# Patient Record
Sex: Female | Born: 1990 | Race: White | Hispanic: No | Marital: Single | Smoking: Current every day smoker
Health system: Southern US, Community
[De-identification: ages and names within clinical notes are randomized; demographics above are authoritative.]

## PROBLEM LIST (undated history)

## (undated) ENCOUNTER — Ambulatory Visit: Source: Home / Self Care

## (undated) DIAGNOSIS — K219 Gastro-esophageal reflux disease without esophagitis: Secondary | ICD-10-CM

## (undated) DIAGNOSIS — R011 Cardiac murmur, unspecified: Secondary | ICD-10-CM

## (undated) DIAGNOSIS — I471 Supraventricular tachycardia, unspecified: Secondary | ICD-10-CM

## (undated) DIAGNOSIS — F329 Major depressive disorder, single episode, unspecified: Secondary | ICD-10-CM

## (undated) DIAGNOSIS — K529 Noninfective gastroenteritis and colitis, unspecified: Secondary | ICD-10-CM

## (undated) DIAGNOSIS — E785 Hyperlipidemia, unspecified: Secondary | ICD-10-CM

## (undated) DIAGNOSIS — F419 Anxiety disorder, unspecified: Secondary | ICD-10-CM

## (undated) DIAGNOSIS — R002 Palpitations: Principal | ICD-10-CM

## (undated) DIAGNOSIS — I499 Cardiac arrhythmia, unspecified: Secondary | ICD-10-CM

## (undated) DIAGNOSIS — R519 Headache, unspecified: Secondary | ICD-10-CM

## (undated) DIAGNOSIS — N83209 Unspecified ovarian cyst, unspecified side: Secondary | ICD-10-CM

## (undated) DIAGNOSIS — F32A Depression, unspecified: Secondary | ICD-10-CM

## (undated) DIAGNOSIS — C73 Malignant neoplasm of thyroid gland: Secondary | ICD-10-CM

## (undated) DIAGNOSIS — R55 Syncope and collapse: Secondary | ICD-10-CM

## (undated) HISTORY — DX: Gastro-esophageal reflux disease without esophagitis: K21.9

## (undated) HISTORY — DX: Palpitations: R00.2

## (undated) HISTORY — DX: Hyperlipidemia, unspecified: E78.5

## (undated) HISTORY — DX: Syncope and collapse: R55

## (undated) HISTORY — DX: Noninfective gastroenteritis and colitis, unspecified: K52.9

## (undated) HISTORY — DX: Supraventricular tachycardia, unspecified: I47.10

## (undated) HISTORY — PX: WISDOM TOOTH EXTRACTION: SHX21

## (undated) HISTORY — DX: Supraventricular tachycardia: I47.1

## (undated) HISTORY — DX: Unspecified ovarian cyst, unspecified side: N83.209

## (undated) HISTORY — DX: Malignant neoplasm of thyroid gland: C73

---
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
## 2012-11-04 ENCOUNTER — Other Ambulatory Visit: Payer: Self-pay | Admitting: *Deleted

## 2012-11-04 ENCOUNTER — Ambulatory Visit (INDEPENDENT_AMBULATORY_CARE_PROVIDER_SITE_OTHER): Payer: Self-pay | Admitting: Family Medicine

## 2012-11-04 ENCOUNTER — Encounter: Payer: Self-pay | Admitting: Family Medicine

## 2012-11-04 VITALS — BP 125/73 | HR 67 | Ht 64.0 in | Wt 118.2 lb

## 2012-11-04 DIAGNOSIS — J020 Streptococcal pharyngitis: Secondary | ICD-10-CM | POA: Insufficient documentation

## 2012-11-04 DIAGNOSIS — J029 Acute pharyngitis, unspecified: Secondary | ICD-10-CM

## 2012-11-04 MED ORDER — AMOXICILLIN 200 MG/5ML PO SUSR: 500.0000 mg | Freq: Two times a day (BID) | ORAL | Status: DC

## 2012-11-04 NOTE — Telephone Encounter (Signed)
Called pharmacy and spoke with May. Re: Amoxicillin for 10 days. Lorenda Hatchet, Renato Battles

## 2012-11-04 NOTE — Patient Instructions (Addendum)
It was nice to meet you today.  I think you are unfortunate and have both a cold AND strep throat.  I sent in the liquid antibiotic for you to take- 2 times per day for 10 days.  I would not go to work until Wednesday at the earliest, so that you are not infecting other people.  Come back if you don't start feeling better by the end of the week.   Strep Throat Strep throat is an infection of the throat caused by a bacteria named Streptococcus pyogenes. Your caregiver may call the infection streptococcal "tonsillitis" or "pharyngitis" depending on whether there are signs of inflammation in the tonsils or back of the throat. Strep throat is most common in children from 40 to 67 years old during the cold months of the year, but it can occur in people of any age during any season. This infection is spread from person to person (contagious) through coughing, sneezing, or other close contact. SYMPTOMS   Fever or chills.  Painful, swollen, red tonsils or throat.  Pain or difficulty when swallowing.  White or yellow spots on the tonsils or throat.  Swollen, tender lymph nodes or "glands" of the neck or under the jaw.  Red rash all over the body (rare). DIAGNOSIS  Many different infections can cause the same symptoms. A test must be done to confirm the diagnosis so the right treatment can be given. A "rapid strep test" can help your caregiver make the diagnosis in a few minutes. If this test is not available, a light swab of the infected area can be used for a throat culture test. If a throat culture test is done, results are usually available in a day or two. TREATMENT  Strep throat is treated with antibiotic medicine. HOME CARE INSTRUCTIONS   Gargle with 1 tsp of salt in 1 cup of warm water, 3 to 4 times per day or as needed for comfort.  Family members who also have a sore throat or fever should be tested for strep throat and treated with antibiotics if they have the strep  infection.  Make sure everyone in your household washes their hands well.  Do not share food, drinking cups, or personal items that could cause the infection to spread to others.  You may need to eat a soft food diet until your sore throat gets better.  Drink enough water and fluids to keep your urine clear or pale yellow. This will help prevent dehydration.  Get plenty of rest.  Stay home from school, daycare, or work until you have been on antibiotics for 24 hours.  Only take over-the-counter or prescription medicines for pain, discomfort, or fever as directed by your caregiver.  If antibiotics are prescribed, take them as directed. Finish them even if you start to feel better. SEEK MEDICAL CARE IF:   The glands in your neck continue to enlarge.  You develop a rash, cough, or earache.  You cough up green, yellow-brown, or bloody sputum.  You have pain or discomfort not controlled by medicines.  Your problems seem to be getting worse rather than better. SEEK IMMEDIATE MEDICAL CARE IF:   You develop any new symptoms such as vomiting, severe headache, stiff or painful neck, chest pain, shortness of breath, or trouble swallowing.  You develop severe throat pain, drooling, or changes in your voice.  You develop swelling of the neck, or the skin on the neck becomes red and tender.  You have a fever.  You  develop signs of dehydration, such as fatigue, dry mouth, and decreased urination.  You become increasingly sleepy, or you cannot wake up completely. Document Released: 12/01/2000 Document Revised: 02/26/2012 Document Reviewed: 02/02/2011 [facility] Patient Information 2013 [place], [place].

## 2012-11-04 NOTE — Progress Notes (Signed)
S: Pt comes in today for SDA for HA and sore throat.  Patient reports a history of strep throat, most recently >6 months.  Sore throat x2 days, HA starting this AM.  Throat pain is severe.  Thinks she had a fever.  Took a Goody's today to help with her headache. + mild nausea but no vomiting but thinks this is from the phlegm.  No diarrhea.  +runny nose and cough for 1-2 weeks, then this all started.  No one else at home is sick.  Works at [employer].     ROS: Per HPI  History  Smoking status  . Current Every Day Smoker -- 1.0 packs/day  . Types: Cigarettes  Smokeless tobacco  . Not on file    Comment: thinking about quitting.    O:  Filed Vitals:   11/04/12 1503  BP: 125/73  Pulse: 67    Gen: NAD HEENT: MMM, significant pharyngeal erythema with 1 small exudate on R peritonsillar area, tonsils are not enlarged, shotty cervical LAD CV: RRR, no murmur Pulm: CTA bilat, no wheezes or crackles   A/P: 21 y.o. female p/w strep throat -See problem list -f/u in PRN

## 2012-11-04 NOTE — Assessment & Plan Note (Addendum)
Rapid test positive. Pt does not take pills well, will Rx amox suspension 500mg  BID x10 days for treatment. Out of work until Thursday AM

## 2012-12-25 ENCOUNTER — Ambulatory Visit (INDEPENDENT_AMBULATORY_CARE_PROVIDER_SITE_OTHER): Payer: Self-pay | Admitting: Emergency Medicine

## 2012-12-25 ENCOUNTER — Encounter: Payer: Self-pay | Admitting: Emergency Medicine

## 2012-12-25 VITALS — BP 118/61 | HR 98 | Temp 99.6°F | Wt 118.0 lb

## 2012-12-25 DIAGNOSIS — J069 Acute upper respiratory infection, unspecified: Secondary | ICD-10-CM

## 2012-12-25 MED ORDER — HYDROCODONE-HOMATROPINE 5-1.5 MG/5ML PO SYRP: 5.0000 mL | ORAL_SOLUTION | Freq: Three times a day (TID) | ORAL | Status: DC | PRN

## 2012-12-25 NOTE — Progress Notes (Signed)
  Subjective:    Patient ID: Jenna [name], female    DOB: [birth year], 22 y.o.   MRN: [id number]  HPI Jenna [name] is here for a SDA for flu-like symptoms.  She reports 3 days of cough, rhinorrhea, nasal congestion, and body aches.  States had a fever of 100.?.  Also has had some wheezing.  Initially had a sore throat, but this is improved now.  No n/v/d/abd pain.  Ears feel stuffy.  Has been taking tylenol and ibuprofen as needed for fever and discomfort.  Did not receive flu shot this year.  I have reviewed and updated the following as appropriate: allergies and current medications PMHx: no asthma  SHx: current smoker  Review of Systems See HPI    Objective:   Physical Exam BP 118/61  Pulse 98  Temp 99.6 F (37.6 C) (Oral)  Wt 118 lb (53.524 kg) Gen: alert, cooperative, NAD HEENT: AT/[place], sclera white, MMM, no pharyngeal erythema or exudate Neck: supple, no LAD CV: regular rhythm, mild tachycardia, no murmurs Pulm: normal work of breathing, good air movement, end inspiratory wheezes in bilateral bases, no egophony Ext: no edema, 2+ DP pulses bilaterally     Assessment & Plan:

## 2012-12-25 NOTE — Patient Instructions (Addendum)
I think you might have the flu. Use Afrin (oxymetolazone) nasal spray for nasal congestion.  Use every 12 hours for NO MORE THAN 3 DAYS!! Use ibuprofen as needed for fever or discomfort. Use the cough syrup as needed. You should start feeling better by this weekend. If you are not feeling better, please come back to clinic on Monday and we can re-evaluate and get a chest x-ray.  If this get worse over the next day or two, please come back sooner.

## 2012-12-25 NOTE — Assessment & Plan Note (Addendum)
Possible influenza.  Outside tami-flu window.  Discussed symptomatic treatment with Afrin and Hycodan.  Will return on Monday if not feeling better.  Will likely need CXR at that time, if not better.

## 2013-06-09 ENCOUNTER — Ambulatory Visit (INDEPENDENT_AMBULATORY_CARE_PROVIDER_SITE_OTHER): Payer: Self-pay | Admitting: Family Medicine

## 2013-06-09 ENCOUNTER — Encounter: Payer: Self-pay | Admitting: Family Medicine

## 2013-06-09 ENCOUNTER — Ambulatory Visit: Payer: Self-pay

## 2013-06-09 VITALS — BP 132/84 | HR 89 | Temp 97.8°F | Wt 117.0 lb

## 2013-06-09 DIAGNOSIS — J020 Streptococcal pharyngitis: Secondary | ICD-10-CM

## 2013-06-09 LAB — POCT RAPID STREP A (OFFICE): Rapid Strep A Screen: NEGATIVE

## 2013-06-09 MED ORDER — PENICILLIN V POTASSIUM 250 MG/5ML PO SOLR: 500.0000 mg | Freq: Two times a day (BID) | ORAL | Status: DC

## 2013-06-09 NOTE — Assessment & Plan Note (Signed)
Rapid strep negative. Given temperature likely >100.4 without meds, no cough, tender lymph nodes, and tonsilar enlargement-Centor score of 4. Will empirically treat for strep throat at this time and use liquid due to patient history of poor compliance with pills.

## 2013-06-09 NOTE — Patient Instructions (Signed)
I am going to treat you for strep throat given your elevated temperatures while on medicine, tender lymph nodes, swollen tonsils and lack of cough. Take antibiotic for 10 days.   Follow up in 10 days if not improved or earlier if symptoms worsen.   Thanks,  Dr. Durene Cal

## 2013-06-09 NOTE — Progress Notes (Signed)
Subjective:   # Sore Throat Patient complains of sore throat. Associated symptoms include sore throat, swollen glands and denies cough. Onset of symptoms was 4 days ago, and have been gradually worsening since that time. She is drinking moderate amounts of fluids. She has not had recent close exposure to someone with proven streptococcal pharyngitis but works at [employer] and has had many sick contacts. Temperature up to 100.2 and constantly taking ibuprofen/tylenol for pain. No cough/cold/congestion symptoms.   ROS--See HPI  Past Medical History-history of multiple occurences of strep throat Reviewed problem list.  Medications- reviewed and updated Chief complaint-noted  Objective: BP 132/84  Pulse 89  Temp(Src) 97.8 F (36.6 C) (Oral)  Wt 117 lb (53.071 kg)  BMI 20.07 kg/m2 Gen: NAD, resting comfortably on table HEENT; tonsilar enlargement without exudate, tender anterior cervical lymph nodes. No rhinorrhea, normal TMs. MMM. CV: RRR no murmurs rubs or gallops Lungs: CTAB no crackles, wheeze, rhonchi Skin: warm, dry Neuro: grossly normal, moves all extremities  Assessment/Plan:  Discussed with patient she needs TDAP when not ill

## 2013-08-14 ENCOUNTER — Encounter: Payer: Self-pay | Admitting: Family Medicine

## 2013-08-14 ENCOUNTER — Ambulatory Visit (INDEPENDENT_AMBULATORY_CARE_PROVIDER_SITE_OTHER): Payer: Self-pay | Admitting: Family Medicine

## 2013-08-14 VITALS — BP 122/60 | HR 76 | Temp 99.3°F | Ht 64.0 in | Wt 119.0 lb

## 2013-08-14 DIAGNOSIS — Z3049 Encounter for surveillance of other contraceptives: Secondary | ICD-10-CM

## 2013-08-14 DIAGNOSIS — Z3009 Encounter for other general counseling and advice on contraception: Secondary | ICD-10-CM

## 2013-08-14 MED ORDER — NORGESTIMATE-ETH ESTRADIOL 0.25-35 MG-MCG PO TABS: 1.0000 | ORAL_TABLET | Freq: Every day | ORAL | Status: DC

## 2013-08-15 NOTE — Progress Notes (Signed)
Patient ID: Jenna [name], female   DOB: [birth year], 22 y.o.   MRN: [id number] PROCEDURE NOTE: NEXPLANON  REMOVAL Patient given informed consent and signed copy in the chart. Left  arm area prepped and draped in the usual sterile fashion. Three cc of lidocaine without epinephrine 1% used for local anesthesia. A small stab incision was made close to the nexplanon with scalpel. Hemostats were used to withdraw the nexplanon. A small bandage was applied over a steri strip  No complications.Patient given follow up instructions should she experience redness, swelling at sight or fever in the next 24 hours. Patient was reminded this totally removes her nexplanon contraceptive devise. (she can now potentially conceive)

## 2013-09-19 ENCOUNTER — Ambulatory Visit (INDEPENDENT_AMBULATORY_CARE_PROVIDER_SITE_OTHER): Payer: Self-pay | Admitting: Family Medicine

## 2013-09-19 ENCOUNTER — Encounter: Payer: Self-pay | Admitting: Family Medicine

## 2013-09-19 VITALS — BP 115/71 | HR 71 | Temp 98.4°F | Ht 64.25 in | Wt 123.0 lb

## 2013-09-19 DIAGNOSIS — J020 Streptococcal pharyngitis: Secondary | ICD-10-CM

## 2013-09-19 DIAGNOSIS — B9789 Other viral agents as the cause of diseases classified elsewhere: Secondary | ICD-10-CM

## 2013-09-19 LAB — POCT RAPID STREP A (OFFICE): Rapid Strep A Screen: NEGATIVE

## 2013-09-19 MED ORDER — AMOXICILLIN-POT CLAVULANATE 400-57 MG/5ML PO SUSR: 875.0000 mg | Freq: Two times a day (BID) | ORAL | Status: DC

## 2013-09-19 NOTE — Patient Instructions (Addendum)
This is likely viral in nature.  Give it 2-3 more days.  If you are not improving take the antibiotic.  Follow up as needed.

## 2013-09-21 DIAGNOSIS — B9789 Other viral agents as the cause of diseases classified elsewhere: Secondary | ICD-10-CM | POA: Insufficient documentation

## 2013-09-21 NOTE — Assessment & Plan Note (Signed)
Given symptoms and physical exam, this is likely viral in origin. Strep swab negative. Patient reassured today.  However, given severity of symptoms I gave Rx for Augmentin (to be taken if symptoms worsen after 2-3 days).

## 2013-09-21 NOTE — Progress Notes (Signed)
Subjective:     Patient ID: Jenna [name], female   DOB: Apr 18, 1991, 22 y.o.   MRN: [id number]  HPI 22 year old female presents for a same day visit with complaints of congestion and cough.  1) Congestion, cough - Patient reports chest and nasal congestion and productive cough x 2 days. - She reports subjective fever and chills.  - She has tried Dayquil and nyquil with no improvement in her symptoms. 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 She does not chest pain secondary to cough.  Review of Systems Per HPI    Objective:   Physical Exam Filed Vitals:   09/19/13 1552  BP: 115/71  Pulse: 71  Temp: 98.4 F (36.9 C)   Exam: General: well developed, well nourished. Appears in NAD. HEENT: NCAT. No sinus tenderness.  Pharyngeal erythema noted.  No tonsillar exudate.  Normal TM's bilaterally. Neck: No cervical adenopathy noted.  Cardiovascular: RRR. No murmurs, rubs, or gallops. Respiratory: CTAB. No rales, rhonchi, or wheeze. Abdomen: soft, nontender, nondistended.    Assessment:     See Problem List     Plan:

## 2014-09-07 ENCOUNTER — Other Ambulatory Visit: Payer: Self-pay | Admitting: Family Medicine

## 2014-10-08 ENCOUNTER — Ambulatory Visit: Payer: Self-pay | Admitting: Family Medicine

## 2014-10-16 ENCOUNTER — Encounter: Payer: Self-pay | Admitting: Family Medicine

## 2014-10-16 ENCOUNTER — Ambulatory Visit (INDEPENDENT_AMBULATORY_CARE_PROVIDER_SITE_OTHER): Payer: Self-pay | Admitting: Family Medicine

## 2014-10-16 ENCOUNTER — Other Ambulatory Visit (HOSPITAL_COMMUNITY)
Admission: RE | Admit: 2014-10-16 | Discharge: 2014-10-16 | Disposition: A | Discharge status: Home / Self Care | Payer: Self-pay | Source: Ambulatory Visit | Attending: Family Medicine | Admitting: Family Medicine

## 2014-10-16 VITALS — BP 130/78 | HR 67 | Temp 98.3°F | Wt 136.0 lb

## 2014-10-16 DIAGNOSIS — Z124 Encounter for screening for malignant neoplasm of cervix: Secondary | ICD-10-CM

## 2014-10-16 DIAGNOSIS — Z113 Encounter for screening for infections with a predominantly sexual mode of transmission: Secondary | ICD-10-CM | POA: Insufficient documentation

## 2014-10-16 DIAGNOSIS — Z01419 Encounter for gynecological examination (general) (routine) without abnormal findings: Secondary | ICD-10-CM | POA: Insufficient documentation

## 2014-10-16 DIAGNOSIS — Z0000 Encounter for general adult medical examination without abnormal findings: Secondary | ICD-10-CM | POA: Insufficient documentation

## 2014-10-16 LAB — POCT WET PREP (WET MOUNT): CLUE CELLS WET PREP WHIFF POC: POSITIVE

## 2014-10-16 LAB — HIV ANTIBODY (ROUTINE TESTING W REFLEX): HIV 1&2 Ab, 4th Generation: NONREACTIVE

## 2014-10-16 MED ORDER — FLUCONAZOLE 150 MG PO TABS: 150.0000 mg | ORAL_TABLET | Freq: Once | ORAL | Status: DC

## 2014-10-16 MED ORDER — METRONIDAZOLE 500 MG PO TABS: 500.0000 mg | ORAL_TABLET | Freq: Two times a day (BID) | ORAL | Status: DC

## 2014-10-16 NOTE — Assessment & Plan Note (Addendum)
Concern for STI Encouraged condom use Check GCC, wet prep, Pap (21 now), HIV, RPR BV and yeast vaginitis - Wet prep with clue cells, + whiff, and yeast- Tx with flagyl and diflucan X 2

## 2014-10-16 NOTE — Patient Instructions (Signed)
Great to meet you today  We will send a letter or call with your results.   Safe Sex Safe sex is about reducing the risk of giving or getting a sexually transmitted disease (STD). STDs are spread through sexual contact involving the genitals, mouth, or rectum. Some STDs can be cured and others cannot. Safe sex can also prevent unintended pregnancies.  WHAT ARE SOME SAFE SEX PRACTICES?  Limit your sexual activity to only one partner who is having sex with only you.  Talk to your partner about his or her past partners, past STDs, and drug use.  Use a condom every time you have sexual intercourse. This includes vaginal, oral, and anal sexual activity. Both females and males should wear condoms during oral sex. Only use latex or polyurethane condoms and water-based lubricants. Using petroleum-based lubricants or oils to lubricate a condom will weaken the condom and increase the chance that it will break. The condom should be in place from the beginning to the end of sexual activity. Wearing a condom reduces, but does not completely eliminate, your risk of getting or giving an STD. STDs can be spread by contact with infected body fluids and skin.  Get vaccinated for hepatitis B and HPV.  Avoid alcohol and recreational drugs, which can affect your judgment. You may forget to use a condom or participate in high-risk sex.  For females, avoid douching after sexual intercourse. Douching can spread an infection farther into the reproductive tract.  Check your body for signs of sores, blisters, rashes, or unusual discharge. See your health care provider if you notice any of these signs.  Avoid sexual contact if you have symptoms of an infection or are being treated for an STD. If you or your partner has herpes, avoid sexual contact when blisters are present. Use condoms at all other times.  If you are at risk of being infected with HIV, it is recommended that you take a prescription medicine daily to  prevent HIV infection. This is called pre-exposure prophylaxis (PrEP). You are considered at risk if:  You are a man who has sex with other men (MSM).  You are a heterosexual man or woman who is sexually active with more than one partner.  You take drugs by injection.  You are sexually active with a partner who has HIV.  Talk with your health care provider about whether you are at high risk of being infected with HIV. If you choose to begin PrEP, you should first be tested for HIV. You should then be tested every 3 months for as long as you are taking PrEP.  See your health care provider for regular screenings, exams, and tests for other STDs. Before having sex with a new partner, each of you should be screened for STDs and should talk about the results with each other. WHAT ARE THE BENEFITS OF SAFE SEX?   There is less chance of getting or giving an STD.  You can prevent unwanted or unintended pregnancies.  By discussing safe sex concerns with your partner, you may increase feelings of intimacy, comfort, trust, and honesty between the two of you. Document Released: 01/11/2005 Document Revised: 04/20/2014 Document Reviewed: 05/27/2012 [facility] Patient Information 2015 [place], [place]. This information is not intended to replace advice given to you by your health care provider. Make sure you discuss any questions you have with your health care provider.

## 2014-10-16 NOTE — Progress Notes (Signed)
Patient ID: Jenna [name], female   DOB: 1991-09-10, 23 y.o.   MRN: [id number]   HPI  Patient presents today for concern for STI  Pt states that she has had a normal amount of foul smelling vaginal dc after a breakup with a boyfriend 6 months ago. She has had 2 partners in the last 6 months and states that she uses condoms regularly.   She denies vaginal itching, pelvic pain, or dyspareunia.   She denies fevers, chills, sweats, or change in PO intake.   She Has regular periods and is on sprintec for contraception   LMP 1 week ago Smoking status noted ROS: Per HPI  Objective: BP 130/78  Pulse 67  Temp(Src) 98.3 F (36.8 C) (Oral)  Wt 136 lb (61.689 kg)  LMP 10/09/2014 Gen: NAD, alert, cooperative with exam HEENT: NCAT Ext: No edema, warm Neuro: Alert and oriented, No gross deficits GU: Normal appearing perineum without rash or lesion, vaginal wall pink an well rugated, moderate to heavy thick white to yellow cervical DC, cervix with nulliparous os and symmetric ectropion, no adnexa tenderness or fullness, no CMT  Assessment and plan:  Screening examination for sexually transmitted disease Concern for STI Encouraged condom use Check GCC, wet prep, Pap (21 now), HIV, RPR BV and yeast vaginitis - Wet prep with clue cells, + whiff, and yeast- Tx with flagyl and diflucan X 2

## 2014-10-17 LAB — RPR

## 2014-10-19 ENCOUNTER — Telehealth: Payer: Self-pay | Admitting: Family Medicine

## 2014-10-19 LAB — CYTOLOGY - PAP

## 2014-10-19 LAB — CERVICOVAGINAL ANCILLARY ONLY
Chlamydia: NEGATIVE
NEISSERIA GONORRHEA: NEGATIVE

## 2014-10-19 NOTE — Telephone Encounter (Signed)
Recent labs negative including HIV, gonorrhea, chlamydia, and syphilis tests. Her pap was normal as well.   I treated her for BV and a yeast infection.   Will ask nursing to notify.   Laroy Apple, MD [facility] Resident, PGY-3 10/19/2014, 7:27 PM

## 2014-10-20 NOTE — Telephone Encounter (Signed)
Tried calling patient, no answer and mailbox full. Will try again later

## 2014-10-20 NOTE — Telephone Encounter (Signed)
Pt informed. Frank Pilger CMA 

## 2014-11-13 ENCOUNTER — Other Ambulatory Visit: Payer: Self-pay | Admitting: Family Medicine

## 2014-11-22 ENCOUNTER — Telehealth: Payer: Self-pay | Admitting: Physician Assistant

## 2014-11-22 DIAGNOSIS — K219 Gastro-esophageal reflux disease without esophagitis: Secondary | ICD-10-CM

## 2014-11-22 MED ORDER — OMEPRAZOLE 20 MG PO CPDR: 20.0000 mg | DELAYED_RELEASE_CAPSULE | Freq: Every day | ORAL | Status: DC

## 2014-11-22 NOTE — Progress Notes (Signed)
We are sorry that you are not feeling well.  Here is how we plan to help!  Based on what you shared with me it looks like you most likely have Gastroesophageal Reflux Disease (GERD)  Gastroesophageal reflux disease (GERD) happens when acid from your stomach flows up into the esophagus.  When acid comes in contact with the esophagus, the acid causes sorenss (inflammation) in the esophagus.  Over time, GERD may create small holes (ulcers) in the lining of the esophagus.  I have prescribed Omeprazole, a Protein Pump inhibitor, 20 mg daily until you follow up with a provider.  Your symptoms should improve in the next day or two.  You can use antacids as needed until symptoms resolve.  Call us if your heartburn worsens, you have trouble swallowing, weight loss, spitting up blood or recurrent vomiting.  Home Care:  May include lifestyle changes such as weight loss, quitting smoking and alcohol consumption  Avoid foods and drinks that make your symptoms worse, such as:  Caffeine or alcoholic drinks  Chocolate  Peppermint or mint flavorings  Garlic and onions  Spicy foods  Citrus fruits, such as oranges, lemons, or limes  Tomato-based foods such as sauce, chili, salsa and pizza  Fried and fatty foods  Avoid lying down for 3 hours prior to your bedtime or prior to taking a nap  Eat small, frequent meals instead of a large meals  Wear loose-fitting clothing.  Do not wear anything tight around your waist that causes pressure on your stomach.  Raise the head of your bed 6 to 8 inches with wood blocks to help you sleep.  Extra pillows will not help.  Seek Help Right Away If:  You have pain in your arms, neck, jaw, teeth or back  Your pain increases or changes in intensity or duration  You develop nausea, vomiting or sweating (diaphoresis)  You develop shortness of breath or you faint  Your vomit is green, yellow, black or looks like coffee grounds or blood  Your stool is red,  bloody or black  These symptoms could be signs of other problems, such as heart disease, gastric bleeding or esophageal bleeding.  Make sure you :  Understand these instructions.  Will watch your condition.  Will get help right away if you are not doing well or get worse.  Your e-visit answers were reviewed by a board certified advanced clinical practitioner to complete your personal care plan.  Depending on the condition, your plan could have included both over the counter or prescription medications.  Please review your pharmacy choice.  If there is a problem, you may call our nursing hot line at [phone] and have the prescription routed to another pharmacy.  Your safety is important to us.  If you have drug allergies check your prescription carefully.    You can use MyChart to ask questions about today's visit, request a non-urgent call back, or ask for a work or school excuse.  You will get an e-mail in the next two days asking about your experience.  I hope that your e-visit has been valuable and will speed your recovery. Thank you for using e-visits.    

## 2014-11-25 ENCOUNTER — Encounter: Payer: Self-pay | Admitting: Family Medicine

## 2015-04-26 ENCOUNTER — Other Ambulatory Visit: Payer: Self-pay | Admitting: Family Medicine

## 2015-04-27 ENCOUNTER — Other Ambulatory Visit: Payer: Self-pay | Admitting: Family Medicine

## 2015-04-30 ENCOUNTER — Other Ambulatory Visit: Payer: Self-pay | Admitting: Family Medicine

## 2015-04-30 NOTE — Telephone Encounter (Signed)
Pt had a pap smear in December and was dx with bacterial vaginosis, has had it in the past several times, thinks she has it again, would like a refill instead of coming in since it was so soon since her last pap

## 2015-05-03 NOTE — Telephone Encounter (Signed)
Rx sent to pharmacy by Dr. Awanda Mink, tried calling patient, unable to leave message as mailbox was full.

## 2015-06-26 ENCOUNTER — Encounter (HOSPITAL_COMMUNITY): Payer: Self-pay

## 2015-06-26 ENCOUNTER — Emergency Department (HOSPITAL_COMMUNITY)
Admission: EM | Admit: 2015-06-26 | Discharge: 2015-06-26 | Disposition: A | Discharge status: Home / Self Care | Payer: Self-pay | Attending: Emergency Medicine | Admitting: Emergency Medicine

## 2015-06-26 ENCOUNTER — Emergency Department (HOSPITAL_COMMUNITY): Payer: Self-pay

## 2015-06-26 DIAGNOSIS — Z720 Tobacco use: Secondary | ICD-10-CM | POA: Insufficient documentation

## 2015-06-26 DIAGNOSIS — R002 Palpitations: Secondary | ICD-10-CM | POA: Insufficient documentation

## 2015-06-26 DIAGNOSIS — Z79899 Other long term (current) drug therapy: Secondary | ICD-10-CM | POA: Insufficient documentation

## 2015-06-26 DIAGNOSIS — R011 Cardiac murmur, unspecified: Secondary | ICD-10-CM | POA: Insufficient documentation

## 2015-06-26 DIAGNOSIS — R51 Headache: Secondary | ICD-10-CM | POA: Insufficient documentation

## 2015-06-26 DIAGNOSIS — F419 Anxiety disorder, unspecified: Secondary | ICD-10-CM | POA: Insufficient documentation

## 2015-06-26 DIAGNOSIS — Z7982 Long term (current) use of aspirin: Secondary | ICD-10-CM | POA: Insufficient documentation

## 2015-06-26 HISTORY — DX: Cardiac murmur, unspecified: R01.1

## 2015-06-26 LAB — BASIC METABOLIC PANEL
ANION GAP: 8 (ref 5–15)
BUN: 7 mg/dL (ref 6–20)
CHLORIDE: 107 mmol/L (ref 101–111)
CO2: 24 mmol/L (ref 22–32)
Calcium: 9.2 mg/dL (ref 8.9–10.3)
Creatinine, Ser: 0.78 mg/dL (ref 0.44–1.00)
GFR calc non Af Amer: >60 mL/min (ref >60)
Glucose, Bld: 102 mg/dL — ABNORMAL HIGH (ref 65–99)
Potassium: 3.5 mmol/L (ref 3.5–5.1)
Sodium: 139 mmol/L (ref 135–145)

## 2015-06-26 LAB — CBC
HCT: 40.7 % (ref 36.0–46.0)
Hemoglobin: 13.8 g/dL (ref 12.0–15.0)
MCH: 30.4 pg (ref 26.0–34.0)
MCHC: 33.9 g/dL (ref 30.0–36.0)
MCV: 89.6 fL (ref 78.0–100.0)
PLATELETS: 265 10*3/uL (ref 150–400)
RBC: 4.54 MIL/uL (ref 3.87–5.11)
RDW: 12.2 % (ref 11.5–15.5)
WBC: 7.7 10*3/uL (ref 4.0–10.5)

## 2015-06-26 LAB — I-STAT TROPONIN, ED: TROPONIN I, POC: 0 ng/mL (ref 0.00–0.08)

## 2015-06-26 LAB — I-STAT BETA HCG BLOOD, ED (MC, WL, AP ONLY): I-stat hCG, quantitative: <5 m[IU]/mL (ref <5)

## 2015-06-26 NOTE — ED Provider Notes (Signed)
CSN: [id number]     Arrival date & time 06/26/15  1721 History   First MD Initiated Contact with Patient 06/26/15 1738     Chief Complaint  Patient presents with  . Palpitations     (Consider location/radiation/quality/duration/timing/severity/associated sxs/prior Treatment) HPI    PCP: [facility], DO Blood pressure 123/50, pulse 64, temperature 98.1 F (36.7 C), temperature source Oral, resp. rate 16, height 5\' 4"  (1.626 m), weight 140 lb (63.504 kg), last menstrual period 06/22/2015, SpO2 98 %.  Jenna [name] is a 24 y.o.female with a significant PMH of chronic diarrhea and heart murmur presents to the ER with complaints of palpitations. The palpitations started last night and she believes that they persisted for 7 minutes. She became very anxious and got sweaty hands, headache, SOB, chest pain but is not sure if it was the palpitations or the anxiety that caused it. She does not drink much water but drinks juice, Mountain Dew, Berkel tea and many caffeinated beverages throughout the day. She has not been more  Stress that normal. Her mom who is with her reports that she has a family history of tachyarrhythmias but cannot remember what kind. The patient felt her heart go up into her throat. She did not have any syncope.  She does not feel well today but has not had anymore palpitations. No shortness of breath, coughing, chest pain, back pain or lower extremity swelling.   The patient denies, fever,weakness (general or focal), confusion, change of vision,  neck pain, dysphagia, aphagia, abdominal pains, nausea, vomiting, , lower extremity swelling, rash.   Past Medical History  Diagnosis Date  . Chronic diarrhea   . Heart murmur    History reviewed. No pertinent past surgical history. Family History  Problem Relation Age of Onset  . Cancer Maternal Grandmother     stomach, ovarian  . Hypertension Maternal Grandfather   . Diabetes Maternal Grandfather    History  Substance Use  Topics  . Smoking status: Current Every Day Smoker -- 1.00 packs/day    Types: Cigarettes  . Smokeless tobacco: Not on file     Comment: thinking about quitting.  . Alcohol Use: No   OB History    No data available     Review of Systems  10 Systems reviewed and are negative for acute change except as noted in the HPI.    Allergies  Review of patient's allergies indicates no known allergies.  Home Medications   Prior to Admission medications   Medication Sig Start Date End Date Taking? Authorizing Provider  aspirin EC 81 MG tablet Take 81 mg by mouth every 6 (six) hours as needed for moderate pain.    Yes Historical Provider, MD  ibuprofen (ADVIL,MOTRIN) 200 MG tablet Take 600 mg by mouth every 6 (six) hours as needed for moderate pain.   Yes Historical Provider, MD  amoxicillin-clavulanate (AUGMENTIN) 400-57 MG/5ML suspension Take 10.9 mLs (875 mg total) by mouth 2 (two) times daily. For 7 days. 09/19/13   Coral Spikes, MD  Etonogestrel (IMPLANON) 68 MG IMPL Inject into the skin. 2011     Historical Provider, MD  fluconazole (DIFLUCAN) 150 MG tablet Take 1 tablet (150 mg total) by mouth once. And repeat in 3 days 10/16/14   Timmothy Euler, MD  metroNIDAZOLE (FLAGYL) 500 MG tablet TAKE ONE TABLET BY MOUTH TWICE DAILY 04/30/15   Nolon Rod, DO  norgestimate-ethinyl estradiol (SPRINTEC 28) 0.25-35 MG-MCG tablet Take 1 tablet by mouth daily. 11/17/14  Leeanne Rio, MD  omeprazole (PRILOSEC) 20 MG capsule Take 1 capsule (20 mg total) by mouth daily. 11/22/14   Brunetta Jeans, PA-C  penicillin v potassium (VEETID) 250 MG/5ML solution Take 10 mLs (500 mg total) by mouth 2 (two) times daily. For 10 days 06/09/13   Marin Olp, MD   BP 122/67 mmHg  Pulse 60  Temp(Src) 98.1 F (36.7 C) (Oral)  Resp 10  Ht 5\' 4"  (1.626 m)  Wt 140 lb (63.504 kg)  BMI 24.02 kg/m2  SpO2 99%  LMP 06/22/2015 Physical Exam  Constitutional: She is oriented to person, place, and time. She  appears well-developed and well-nourished. No distress.  HENT:  Head: Normocephalic and atraumatic.  Eyes: Pupils are equal, round, and reactive to light.  Neck: Normal range of motion. Neck supple.  Cardiovascular: Normal rate and regular rhythm.   + systolic murmur  Pulmonary/Chest: Effort normal and breath sounds normal. No accessory muscle usage. She has no decreased breath sounds. She has no wheezes. She has no rhonchi.  Abdominal: Soft.  Musculoskeletal:  No lower extremity swelling  Neurological: She is alert and oriented to person, place, and time.  Skin: Skin is warm and dry.  Nursing note and vitals reviewed.   ED Course  Procedures (including critical care time) Labs Review Labs Reviewed  BASIC METABOLIC PANEL - Abnormal; Notable for the following:    Glucose, Bld 102 (*)    All other components within normal limits  CBC  I-STAT TROPOININ, ED  I-STAT BETA HCG BLOOD, ED ([facility], [facility], [facility] ONLY)    Imaging Review Dg Chest 2 View  06/26/2015   CLINICAL DATA:  Acute onset of heart flutter. Diaphoresis, headache, shortness of breath and chest pains. Initial encounter.  EXAM: CHEST  2 VIEW  COMPARISON:  None.  FINDINGS: The lungs are well-aerated and clear. There is no evidence of focal opacification, pleural effusion or pneumothorax. A few tiny calcified granulomata likely reflect remote granulomatous disease.  The heart is normal in size; the mediastinal contour is within normal limits. No acute osseous abnormalities are seen.  IMPRESSION: No acute cardiopulmonary process seen.   Electronically Signed   By: Garald Balding M.D.   On: 06/26/2015 17:56     EKG Interpretation   Date/Time:  Saturday June 26 2015 17:23:29 EDT Ventricular Rate:  69 PR Interval:  148 QRS Duration: 84 QT Interval:  402 QTC Calculation: 430 R Axis:   90 Text Interpretation:  Normal sinus rhythm Rightward axis Abnormal ECG  Confirmed by ALLEN  MD, ANTHONY ([id number]) on 06/26/2015 5:40:00 PM      MDM    Final diagnoses:  Palpitations   Patients work-up in the ED is normal and no arrhythmia was captured on tele or EKG. Neg i-stat hCG, trop, CBC, and BMP is unremarkable. Chest xray is WNL.   The patient is encouraged to return to the ED if the symptoms reoccur for evaluation we can capture the arrhythmia. She is also been advised to return if her shortness of breath or symptoms of syncope present. Otherwise the patient is to follow-up with her primary care doctor which is Dr. Ardelia Mems and follow-up with her cardiology group for Holter monitor.  Medications - No data to display  24 y.o.Cyera Mcbrien's evaluation in the Emergency Department is complete. It has been determined that no acute conditions requiring further emergency intervention are present at this time. The patient/guardian have been advised of the diagnosis and plan. We have discussed signs  and symptoms that warrant return to the ED, such as changes or worsening in symptoms.  Vital signs are stable at discharge. Filed Vitals:   06/26/15 1943  BP: 122/67  Pulse: 60  Temp:   Resp: 10    Patient/guardian has voiced understanding and agreed to follow-up with the PCP or specialist.     Delos Haring, PA-C 06/26/15 2001  Lacretia Leigh, MD 06/27/15 [phone]

## 2015-06-26 NOTE — ED Notes (Addendum)
Pt states she was lying down last night and felt her heart start fluttering. States her hands got sweaty, headache, SOB, chest pains, and hasn't felt good all day. Has a heart murmur. Today her heart rate has been between 61-73 on average, with at least 4 episodes of irregular beats per minute, also twice her pulse increased to 122 from 62 in 10 seconds while at rest.

## 2015-06-26 NOTE — ED Notes (Signed)
Provider bedside.

## 2015-06-26 NOTE — Discharge Instructions (Signed)
Holter Monitoring A Holter monitor is a small device with electrodes (small sticky patches) that attach to your chest. It records the electrical activity of your heart and is worn continuously for 24-48 hours.  A HOLTER MONITOR IS USED TO  Detect heart problems such as:  Heart arrhythmia. Is an abnormal or irregular heartbeat. With some heart arrhythmias, you may not feel or know that you have an irregular heart rhythm.  Palpitations, such as feeling your heart racing or fluttering. It is possible to have heart palpitations and not have a heart arrhythmia.  A heart rhythm that is too slow or too fast.  If you have problems fainting, near fainting or feeling light-headed, a Holter monitor may be worn to see if your heart is the cause. HOLTER MONITOR PREPARATION   Electrodes will be attached to the skin on your chest.  If you have hair on your chest, small areas may have to be shaved. This is done to help the patches stick better and make the recording more accurate.  The electrodes are attached by wires to the Holter monitor. The Holter monitor clips to your clothing. You will wear the monitor at all times, even while exercising and sleeping. HOME CARE INSTRUCTIONS   Wear your monitor at all times.  The wires and the monitor must stay dry. Do not get the monitor wet.  Do not bathe, swim or use a hot tub with it on.  You may do a "sponge" bath while you have the monitor on.  Keep your skin clean, do not put body lotion or moisturizer on your chest.  It's possible that your skin under the electrodes could become irritated. To keep this from happening, you may put the electrodes in slightly different places on your chest.  Your caregiver will also ask you to keep a diary of your activities, such as walking or doing chores. Be sure to note what you are doing if you experience heart symptoms such as palpitations. This will help your caregiver determine what might be contributing to your  symptoms. The information stored in your monitor will be reviewed by your caregiver alongside your diary entries.  Make sure the monitor is safely clipped to your clothing or in a location close to your body that your caregiver recommends.  The monitor and electrodes are removed when the test is over. Return the monitor as directed.  Be sure to follow up with your caregiver and discuss your Holter monitor results. SEEK IMMEDIATE MEDICAL CARE IF:  You faint or feel lightheaded.  You have trouble breathing.  You get pain in your chest, upper arm or jaw.  You feel sick to your stomach and your skin is pale, cool, or damp.  You think something is wrong with the way your heart is beating. MAKE SURE YOU:   Understand these instructions.  Will watch your condition.  Will get help right away if you are not doing well or get worse. Document Released: 09/01/2004 Document Revised: 02/26/2012 Document Reviewed: 01/14/2009 [facility] Patient Information 2015 [place], [place]. This information is not intended to replace advice given to you by your health care provider. Make sure you discuss any questions you have with your health care provider. Palpitations A palpitation is the feeling that your heartbeat is irregular or is faster than normal. It may feel like your heart is fluttering or skipping a beat. Palpitations are usually not a serious problem. However, in some cases, you may need further medical evaluation. CAUSES  Palpitations  can be caused by:  Smoking.  Caffeine or other stimulants, such as diet pills or energy drinks.  Alcohol.  Stress and anxiety.  Strenuous physical activity.  Fatigue.  Certain medicines.  Heart disease, especially if you have a history of irregular heart rhythms (arrhythmias), such as atrial fibrillation, atrial flutter, or supraventricular tachycardia.  An improperly working pacemaker or defibrillator. DIAGNOSIS  To find the cause of your  palpitations, your health care provider will take your medical history and perform a physical exam. Your health care provider may also have you take a test called an ambulatory electrocardiogram (ECG). An ECG records your heartbeat patterns over a 24-hour period. You may also have other tests, such as:  Transthoracic echocardiogram (TTE). During echocardiography, sound waves are used to evaluate how blood flows through your heart.  Transesophageal echocardiogram (TEE).  Cardiac monitoring. This allows your health care provider to monitor your heart rate and rhythm in real time.  Holter monitor. This is a portable device that records your heartbeat and can help diagnose heart arrhythmias. It allows your health care provider to track your heart activity for several days, if needed.  Stress tests by exercise or by giving medicine that makes the heart beat faster. TREATMENT  Treatment of palpitations depends on the cause of your symptoms and can vary greatly. Most cases of palpitations do not require any treatment other than time, relaxation, and monitoring your symptoms. Other causes, such as atrial fibrillation, atrial flutter, or supraventricular tachycardia, usually require further treatment. HOME CARE INSTRUCTIONS   Avoid:  Caffeinated coffee, tea, soft drinks, diet pills, and energy drinks.  Chocolate.  Alcohol.  Stop smoking if you smoke.  Reduce your stress and anxiety. Things that can help you relax include:  A method of controlling things in your body, such as your heartbeats, with your mind (biofeedback).  Yoga.  Meditation.  Physical activity such as swimming, jogging, or walking.  Get plenty of rest and sleep. SEEK MEDICAL CARE IF:   You continue to have a fast or irregular heartbeat beyond 24 hours.  Your palpitations occur more often. SEEK IMMEDIATE MEDICAL CARE IF:  You have chest pain or shortness of breath.  You have a severe headache.  You feel dizzy or  you faint. MAKE SURE YOU:  Understand these instructions.  Will watch your condition.  Will get help right away if you are not doing well or get worse. Document Released: 12/01/2000 Document Revised: 12/09/2013 Document Reviewed: 02/02/2012 [facility] Patient Information 2015 [place], [place]. This information is not intended to replace advice given to you by your health care provider. Make sure you discuss any questions you have with your health care provider.

## 2015-07-08 ENCOUNTER — Ambulatory Visit (INDEPENDENT_AMBULATORY_CARE_PROVIDER_SITE_OTHER): Payer: Self-pay | Admitting: Physician Assistant

## 2015-07-08 ENCOUNTER — Encounter: Payer: Self-pay | Admitting: *Deleted

## 2015-07-08 ENCOUNTER — Encounter: Payer: Self-pay | Admitting: Physician Assistant

## 2015-07-08 ENCOUNTER — Telehealth (HOSPITAL_COMMUNITY): Payer: Self-pay | Admitting: Physician Assistant

## 2015-07-08 VITALS — BP 104/70 | HR 68 | Ht 64.0 in | Wt 144.2 lb

## 2015-07-08 DIAGNOSIS — R002 Palpitations: Secondary | ICD-10-CM

## 2015-07-08 HISTORY — DX: Palpitations: R00.2

## 2015-07-08 LAB — TSH: TSH: 1.29 u[IU]/mL (ref 0.35–4.50)

## 2015-07-08 NOTE — Progress Notes (Signed)
Patient ID: Jenna [name], female   DOB: 09/25/1991, 24 y.o.   MRN: [id number] Patient enrolled with [employer] for a cardiac event monitor pending approval of hardship application.  Patient does not have a land line phone so, patient will need to be scheduled to have monitor applied in our office if hardship from [employer] approved.

## 2015-07-08 NOTE — Progress Notes (Signed)
Cardiology Office Note   Date:  07/08/2015   ID:  Jenna [name], DOB 09/28/1991, MRN [id number]  PCP:  Jenna Panning, DO  Cardiologist:   Jenna Bible, PA-C   Chief complaint: Palpitations  History of Present Illness: Jenna [name] is a 24 y.o. female with a history of chronic diarrhea and heart murmur. She was seen in the emergency room on 07/09 for palpitations, and referred to cardiology.  [name] has been having palpitations for a long time. They generally last 30 seconds or less. The fluttering is in her upper chest and throat. She generally has no other symptoms in association with the palpitations. They resolve spontaneously. She will get them 2 or more times per week. They happened more often in the evening and at rest.  On 07/08, she had an episode of palpitations that started at rest, and lasted for about 10 minutes. As the palpitations continued on, they were associated with shortness of breath, headache and diaphoresis. There was no chest pain. About 10 minutes after the palpitations stopped, she had an episode of presyncope. The next day, she was very tired and was still having intermittent tachycardia according to a friend who is a Marine scientist. She went to the emergency room, but only sinus rhythm was seen.  Of note, since her ER visit, she has decreased smoking and is trying to cut back on caffeine as well. The palpitations have never been captured on EKG or telemetry. She is to have problems with chronic diarrhea, but that has resolved.   Past Medical History  Diagnosis Date  . Chronic diarrhea   . Heart murmur   . Rapid palpitations 07/08/2015    No past surgical history on file.  Current Outpatient Prescriptions  Medication Sig Dispense Refill  . ibuprofen (ADVIL,MOTRIN) 200 MG tablet Take 600 mg by mouth every 6 (six) hours as needed for moderate pain.      Allergies:   Review of patient's allergies indicates no known allergies.    Social History:  The  patient  reports that she has been smoking Cigarettes.  She has been smoking about 1.00 pack per day. She has never used smokeless tobacco. She reports that she does not drink alcohol or use illicit drugs.   Family History:  The patient's family history includes Arrhythmia in her maternal grandmother and other; Cancer in her paternal grandmother; Diabetes in her maternal grandfather and paternal grandfather; Healthy in her brother and father; Heart attack in her maternal grandfather; Heart disease in her maternal grandfather; Heart murmur in her mother; Hypertension in her maternal grandfather; Other in her maternal aunt.    ROS:  Please see the history of present illness. All other systems are reviewed and negative.    PHYSICAL EXAM: VS:  BP 104/70 mmHg  Pulse 68  Ht 5\' 4"  (1.626 m)  Wt 144 lb 3.2 oz (65.409 kg)  BMI 24.74 kg/m2  SpO2 98%  LMP 06/22/2015 , BMI Body mass index is 24.74 kg/(m^2). GEN: Well nourished, well developed, in no acute distress HEENT: normal Neck: no JVD, carotid bruits, or masses Cardiac: RRR; soft systolic murmur left lower sternal border, no rubs, or gallops,no edema  Respiratory:  clear to auscultation bilaterally, normal work of breathing GI: soft, nontender, nondistended, + BS MS: no deformity or atrophy Skin: warm and dry, no rash Neuro:  Strength and sensation are intact Psych: euthymic mood, full affect   EKG:  EKG is ordered today. The ekg ordered today demonstrates sinus  bradycardia, heart rate 56 with sinus arrhythmia, no delta waves seen.   Recent Labs: 06/26/2015: BUN 7; Creatinine, Ser 0.78; Hemoglobin 13.8; Platelets 265; Potassium 3.5; Sodium 139 07/08/2015: TSH 1.29    Lipid Panel No results found for: CHOL, TRIG, HDL, CHOLHDL, VLDL, LDLCALC, LDLDIRECT   Wt Readings from Last 3 Encounters:  07/08/15 144 lb 3.2 oz (65.409 kg)  06/26/15 140 lb (63.504 kg)  10/16/14 136 lb (61.689 kg)     Other studies Reviewed: Additional studies/  records that were reviewed today include: ER visit , previous ECG.  ASSESSMENT AND PLAN:  1.  Tachypalpitations: The most likely arrhythmia is SVT. I discussed lifestyle modifications with the patient and her mother. I encouraged her to continue to gradually decrease caffeine and quit smoking.  I discussed methods of evaluation. I recommended a 30 day event monitor, they are in agreement. I advised that the most frequent arrhythmia was SVT, and that if the episodes were significant, ablation could be discussed. Advised that we would need a diagnosis and the event monitor is the next step. Follow-up after the monitor.  2. Heart murmur: She has had a heart murmur all her life. Discussed evaluation with an echocardiogram with her mother. They have no insurance, and prefer to limit testing unless absolutely necessary.  Current medicines are reviewed at length with the patient today.  The patient does not have concerns regarding medicines.  The following changes have been made:  no change  Labs/ tests ordered today include:   Orders Placed This Encounter  Procedures  . TSH  . Cardiac event monitor  . EKG 12-Lead     Disposition:   FU with Me or Dr Radford Pax after event monitor  Signed, Lenoard Aden  07/08/2015 1:05 PM    [facility] [place], [place], [place]  [address] Phone: [phone]; Fax: [phone]

## 2015-07-08 NOTE — Telephone Encounter (Signed)
Received from patient Application for LifeWatch monitor.  Patient was enrolled and faxed to [facility].

## 2015-07-08 NOTE — Patient Instructions (Signed)
Medication Instructions:  Your physician recommends that you continue on your current medications as directed. Please refer to the Current Medication list given to you today.  Labwork: Your physician recommends that you have lab work today. TSH   Testing/Procedures: Your physician has recommended that you wear an 30 day event monitor. Event monitors are medical devices that record the heart's electrical activity. Doctors most often [place] these monitors to diagnose arrhythmias. Arrhythmias are problems with the speed or rhythm of the heartbeat. The monitor is a small, portable device. You can wear one while you do your normal daily activities. This is usually used to diagnose what is causing palpitations/syncope (passing out).   Follow-Up: Your physician recommends that you schedule a follow-up appointment on August 18, 2015 1:30 pm with Rosaria Ferries PA.

## 2015-07-21 ENCOUNTER — Ambulatory Visit (INDEPENDENT_AMBULATORY_CARE_PROVIDER_SITE_OTHER): Payer: Self-pay

## 2015-07-21 DIAGNOSIS — R002 Palpitations: Secondary | ICD-10-CM

## 2015-08-18 ENCOUNTER — Ambulatory Visit: Payer: Self-pay | Admitting: Physician Assistant

## 2015-09-07 ENCOUNTER — Encounter: Payer: Self-pay | Admitting: Physician Assistant

## 2015-09-07 NOTE — Progress Notes (Signed)
Cardiology Office Note   Date:  09/09/2015   ID:  Jenna [name], DOB [birth year], MRN [id number]  PCP:  Junie Panning, DO  Cardiologist:  Dr Julaine Fusi, PA-C   No chief complaint on file.   History of Present Illness: Jenna [name] is a 24 y.o. female with a history of palpitations, diarrhea and a heart murmur.   Seen in ER 07/09, Cardiology visit 07/21>>event monitor>>followup visit 09/22.   Jenna [name] presents for f/u of event monitor.  [name] has been having palpitations for a long time. They generally last 30 seconds or less. The fluttering is in her upper chest and throat. She generally has no other symptoms in association with the palpitations. They resolve spontaneously. She will get them 2 or more times per week. They happened more often in the evening and at rest.  On 07/08, she had an episode of palpitations that started at rest, and lasted for about 10 minutes. As the palpitations continued on, they were associated with shortness of breath, headache and diaphoresis. There was no chest pain. About 10 minutes after the palpitations stopped, she had an episode of presyncope. The next day, she was very tired and was still having intermittent tachycardia according to a friend who is a Marine scientist. She went to the emergency room, but only sinus rhythm was seen.  Of note, since her ER visit, she has decreased smoking and is trying to cut back on caffeine as well. The palpitations have never been captured on EKG or telemetry. She is to have problems with chronic diarrhea, but that has resolved.  She is here today in follow-up. She has successfully cut back on caffeine and has cut down significantly on smoking, is now Vaping 3 or so times a day. The palpitations have decreased in frequency. She has not had any long runs and has not had symptoms with this. She feels much improved.   Past Medical History  Diagnosis Date  . Chronic diarrhea   . Heart murmur   . Rapid  palpitations 07/08/2015    Past Surgical History  Procedure Laterality Date  . None      Current Outpatient Prescriptions  Medication Sig Dispense Refill  . ibuprofen (ADVIL,MOTRIN) 200 MG tablet Take 600 mg by mouth every 6 (six) hours as needed for moderate pain.     No current facility-administered medications for this visit.    Allergies:   Review of patient's allergies indicates no known allergies.    Social History:  The patient  reports that she has been smoking Cigarettes.  She has been smoking about 1.00 pack per day. She has never used smokeless tobacco. She reports that she does not drink alcohol or use illicit drugs.   Family History:  The patient's family history includes Arrhythmia in her maternal grandmother and other; Cancer in her paternal grandmother; Diabetes in her maternal grandfather and paternal grandfather; Healthy in her brother and father; Heart attack in her maternal grandfather; Heart disease in her maternal grandfather; Heart murmur in her mother; Hypertension in her maternal grandfather; Other in her maternal aunt.    ROS:  Please see the history of present illness. All other systems are reviewed and negative.    PHYSICAL EXAM: VS:  BP 120/62 mmHg  Pulse 94  Ht 5\' 4"  (1.626 m)  Wt 150 lb 1.9 oz (68.094 kg)  BMI 25.76 kg/m2 , BMI Body mass index is 25.76 kg/(m^2). GEN: Well nourished, well developed, in no acute distress  HEENT: normal Neck: no JVD, carotid bruits, or masses Cardiac: RRR; no murmurs, rubs, or gallops,no edema  Respiratory:  clear to auscultation bilaterally, normal work of breathing GI: soft, nontender, nondistended, + BS MS: no deformity or atrophy Skin: warm and dry, no rash Neuro:  Strength and sensation are intact Psych: euthymic mood, full affect   EKG:  EKG is not ordered today  LifeWatch Monitor Results  Normal sinus rhythm to sinus bradycardia. Heart rate ranged from 43-92 bpm.  Sinus arrhythmia  Occasional  PAC's  Recent Labs: 06/26/2015: BUN 7; Creatinine, Ser 0.78; Hemoglobin 13.8; Platelets 265; Potassium 3.5; Sodium 139 07/08/2015: TSH 1.29    Lipid Panel    Wt Readings from Last 3 Encounters:  09/09/15 150 lb 1.9 oz (68.094 kg)  07/08/15 144 lb 3.2 oz (65.409 kg)  06/26/15 140 lb (63.504 kg)     Other studies Reviewed: Additional studies/ records that were reviewed today include: ECG, event monitor, previous office visits and labs.  ASSESSMENT AND PLAN:  1.  Palpitations: event monitor results above, only PACs seen, no long runs. Reviewed the results with the patient and her mother. Reassured them that she had skips but they came from the top part of her heart not from the bottom part of her heart which is better.  Reassured her that if she starts having longer runs or becomes more symptomatic from these, we can use medication to limit or prevent them.   However, if she continues healthy lifestyle changes with adequate rest, exercise, and limiting caffeine and nicotine, she may have no further problems with these.  Advised her we could see her again when necessary.  Current medicines are reviewed at length with the patient today.  The patient does not have concerns regarding medicines.  The following changes have been made:  no change  Labs/ tests ordered today include:  No orders of the defined types were placed in this encounter.     Disposition:   FU with cardiology when necessary  Jenna [name]  09/09/2015 8:37 AM    Jenna [name], [place], Jenna [name]  [address] Phone: [phone]; Fax: [phone]

## 2015-09-09 ENCOUNTER — Encounter: Payer: Self-pay | Admitting: Physician Assistant

## 2015-09-09 ENCOUNTER — Ambulatory Visit (INDEPENDENT_AMBULATORY_CARE_PROVIDER_SITE_OTHER): Payer: Self-pay | Admitting: Physician Assistant

## 2015-09-09 VITALS — BP 120/62 | HR 94 | Ht 64.0 in | Wt 150.1 lb

## 2015-09-09 DIAGNOSIS — R002 Palpitations: Secondary | ICD-10-CM

## 2015-09-09 NOTE — Patient Instructions (Addendum)
Medication Instructions:  Your physician recommends that you continue on your current medications as directed. Please refer to the Current Medication list given to you today.   Labwork: None ordered  Testing/Procedures: None ordered  Follow-Up: Your physician recommends that you schedule a follow-up appointment AS NEEDED,

## 2015-11-23 ENCOUNTER — Ambulatory Visit (INDEPENDENT_AMBULATORY_CARE_PROVIDER_SITE_OTHER): Payer: Self-pay | Admitting: Nurse Practitioner

## 2015-11-23 ENCOUNTER — Encounter: Payer: Self-pay | Admitting: Nurse Practitioner

## 2015-11-23 VITALS — BP 102/64 | HR 60 | Ht 64.0 in | Wt 155.4 lb

## 2015-11-23 DIAGNOSIS — Z720 Tobacco use: Secondary | ICD-10-CM

## 2015-11-23 DIAGNOSIS — R002 Palpitations: Secondary | ICD-10-CM

## 2015-11-23 NOTE — Progress Notes (Signed)
CARDIOLOGY OFFICE NOTE  Date:  11/23/2015    Jenna [name] Date of Birth: Jun 06, 1991 Medical Record [id number]  PCP:  Junie Panning, DO  Cardiologist:  Radford Pax    Chief Complaint  Patient presents with  . Palpitations    Work in visit - seen for Dr. Radford Pax    History of Present Illness: Jenna [name] is a 24 y.o. female who presents today for a follow up visit. Seen for Dr. Radford Pax. She has a history of ongoing tobacco abuse and palpitations. Has had a monitor. No insurance and thus did not get echocardiogram. Other issue includes chronic diarrhea.   Last seen back in September. Felt to be doing ok and was to come back prn.   Comes back today. Here with her mom. Continues to smoke. She is here because of an episode about a week or two ago (does not remember when exactly) - she fell asleep early, woke at 1AM and felt tingling all over and thought she would faint - went to the bathroom - laid on the floor - threw up. There were no palpitations reported. Said she and her mom looked this up and it was "sinus tach". Says she has had a murmur all her life. Says she has had remote passing out - when she was 6. No documentation of this. Says she has never had echocardiogram. No regular exercise. Continues to smoke. Mom says that "all the women in the family have heart rhythm issues". Mom says she has had ?ASD.  Past Medical History  Diagnosis Date  . Chronic diarrhea   . Heart murmur   . Rapid palpitations 07/08/2015    Past Surgical History  Procedure Laterality Date  . None       Medications: Current Outpatient Prescriptions  Medication Sig Dispense Refill  . Aspirin-Acetaminophen (GOODYS BODY PAIN PO) Take 1 tablet by mouth as needed (headaches).     No current facility-administered medications for this visit.    Allergies: No Known Allergies  Social History: The patient  reports that she has been smoking Cigarettes.  She has been smoking about 1.00 pack per day. She has  never used smokeless tobacco. She reports that she does not drink alcohol or use illicit drugs.   Family History: The patient's family history includes Arrhythmia in her maternal grandmother and other; Cancer in her paternal grandmother; Diabetes in her maternal grandfather and paternal grandfather; Healthy in her brother and father; Heart attack in her maternal grandfather; Heart disease in her maternal grandfather; Heart murmur in her mother; Hypertension in her maternal grandfather; Other in her maternal aunt.   Review of Systems: Please see the history of present illness.   Otherwise, the review of systems is positive for none.   All other systems are reviewed and negative.   Physical Exam: VS:  BP 102/64 mmHg  Pulse 60  Ht 5\' 4"  (1.626 m)  Wt 155 lb 6.4 oz (70.489 kg)  BMI 26.66 kg/m2 .  BMI Body mass index is 26.66 kg/(m^2).  Wt Readings from Last 3 Encounters:  11/23/15 155 lb 6.4 oz (70.489 kg)  09/09/15 150 lb 1.9 oz (68.094 kg)  07/08/15 144 lb 3.2 oz (65.409 kg)   Weight continues to climb.   General: Pleasant. Well developed, well nourished and in no acute distress.  HEENT: Normal. Neck: Supple, no JVD, carotid bruits, or masses noted.  Cardiac: Regular rate and rhythm. Very soft outflow murmur.  No edema.  Respiratory:  Lungs are clear  to auscultation bilaterally with normal work of breathing.  GI: Soft and nontender.  MS: No deformity or atrophy. Gait and ROM intact. Skin: Warm and dry. Color is normal.  Neuro:  Strength and sensation are intact and no gross focal deficits noted.  Psych: Alert, appropriate and with normal affect.   LABORATORY DATA:  EKG:  EKG is ordered today. This demonstrates NSR and is unchanged.  Lab Results  Component Value Date   WBC 7.7 06/26/2015   HGB 13.8 06/26/2015   HCT 40.7 06/26/2015   PLT 265 06/26/2015   GLUCOSE 102* 06/26/2015   ALT 10 08/10/2011   AST 17 08/10/2011   NA 139 06/26/2015   K 3.5 06/26/2015   CL 107  06/26/2015   CREATININE 0.78 06/26/2015   BUN 7 06/26/2015   CO2 24 06/26/2015   TSH 1.29 07/08/2015    BNP (last 3 results) No results for input(s): BNP in the last 8760 hours.  ProBNP (last 3 results) No results for input(s): PROBNP in the last 8760 hours.   Other Studies Reviewed Today: Holter Study Highlights from August 2016     Normal sinus rhythm to sinus bradycardia. Heart rate ranged from 43-92 bpm.  Sinus arrhythmia  Occasional PAC's     Assessment/Plan: 1. Spell of tingling - ? Etiology - would be more consistent with a vagal reaction I think.   2. Palpitations  3. FH of heart issues.   4. Tobacco abuse - advised to abstain.   I think the best course of action is an echocardiogram. Further disposition to follow.   Current medicines are reviewed with the patient today.  The patient does not have concerns regarding medicines other than what has been noted above.  The following changes have been made:  See above.  Labs/ tests ordered today include:   No orders of the defined types were placed in this encounter.     Disposition:   FU with Dr. Radford Pax in.   Patient is agreeable to this plan and will call if any problems develop in the interim.   Signed: Burtis Junes, RN, ANP-C 11/23/2015 2:45 PM  [facility] [address] [place], [place]  [address] Phone: [phone] Fax: [phone]

## 2015-11-23 NOTE — Patient Instructions (Addendum)
We will be checking the following labs today - NONE   Medication Instructions:    Continue with your current medicines.     Testing/Procedures To Be Arranged:  Echocardiogram  Follow-Up:   Will see how the echo turns out and then decide about follow up.     Other Special Instructions:   Stop smoking  Stay away from caffeine  Regular exercise 30 to 45 minutes daily    If you need a refill on your cardiac medications before your next appointment, please call your pharmacy.   Call the [place] office at [phone] if you have any questions, problems or concerns.

## 2015-11-24 ENCOUNTER — Other Ambulatory Visit (HOSPITAL_COMMUNITY): Payer: Self-pay

## 2015-11-29 ENCOUNTER — Encounter: Payer: Self-pay | Admitting: Family

## 2015-11-29 ENCOUNTER — Other Ambulatory Visit (HOSPITAL_COMMUNITY)
Admission: RE | Admit: 2015-11-29 | Discharge: 2015-11-29 | Disposition: A | Discharge status: Home / Self Care | Payer: Self-pay | Source: Ambulatory Visit | Attending: Family | Admitting: Family

## 2015-11-29 ENCOUNTER — Ambulatory Visit (INDEPENDENT_AMBULATORY_CARE_PROVIDER_SITE_OTHER): Payer: Self-pay | Admitting: Family

## 2015-11-29 VITALS — BP 124/65 | HR 63 | Temp 98.4°F | Resp 16 | Ht 64.5 in | Wt 151.6 lb

## 2015-11-29 DIAGNOSIS — N760 Acute vaginitis: Secondary | ICD-10-CM | POA: Insufficient documentation

## 2015-11-29 DIAGNOSIS — F419 Anxiety disorder, unspecified: Secondary | ICD-10-CM

## 2015-11-29 DIAGNOSIS — N898 Other specified noninflammatory disorders of vagina: Secondary | ICD-10-CM

## 2015-11-29 DIAGNOSIS — Z113 Encounter for screening for infections with a predominantly sexual mode of transmission: Secondary | ICD-10-CM | POA: Insufficient documentation

## 2015-11-29 DIAGNOSIS — N9489 Other specified conditions associated with female genital organs and menstrual cycle: Secondary | ICD-10-CM

## 2015-11-29 DIAGNOSIS — N926 Irregular menstruation, unspecified: Secondary | ICD-10-CM

## 2015-11-29 DIAGNOSIS — R55 Syncope and collapse: Secondary | ICD-10-CM

## 2015-11-29 DIAGNOSIS — G43909 Migraine, unspecified, not intractable, without status migrainosus: Secondary | ICD-10-CM

## 2015-11-29 DIAGNOSIS — R635 Abnormal weight gain: Secondary | ICD-10-CM

## 2015-11-29 DIAGNOSIS — Z23 Encounter for immunization: Secondary | ICD-10-CM

## 2015-11-29 MED ORDER — IBUPROFEN 800 MG PO TABS: 800.0000 mg | ORAL_TABLET | Freq: Three times a day (TID) | ORAL | Status: DC | PRN

## 2015-11-29 MED ORDER — SUMATRIPTAN SUCCINATE 50 MG PO TABS: 50.0000 mg | ORAL_TABLET | ORAL | Status: DC | PRN

## 2015-11-29 NOTE — Patient Instructions (Addendum)
Please complete lab work prior to leaving. Stop sugared beverages. Switch to water and selzer. Try to get regular formal exercise 5 days a week.   For migraines you may use ibuprofen (use sparingly- avoid goody powders). For severe headache- try imitrex as needed.

## 2015-11-29 NOTE — Progress Notes (Signed)
Pre visit review using our clinic review tool, if applicable. No additional management support is needed unless otherwise documented below in the visit note. 

## 2015-11-29 NOTE — Progress Notes (Signed)
Subjective:    Patient ID: Jenna [name], female    DOB: 30-Sep-1991, 24 y.o.   MRN: [id number]  HPI   Reports vasovagal syncope-  Since age 24, has noted actually lost her consiousness in several years.  She is followed by cardiology who is evaluating her due to palpitations.    Anxiety- reports that she has intermittent social anxiety. But not serious.  Has never been on anxiety meds.  Weight gain- 40 pounds over the last 1 year.  She is in a better relationship now and less stress. Feels happier. 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  Breakfast biscuit Lunch- eats at [employer]- light dinner at work.    Drinks a lot of soda, aloe juices, water  Lab Results  Component Value Date   TSH 1.29 07/08/2015   Reports "bad headaches."  Sometimes she has HA in the temples, sometimes in the front.  Reports 3+ HA a week.  This is only helped by goodies.  She denies associated nausea, + light sensitivity.  Has never been treated for migraines.    LMP- began 12/6. Had implanon and reports that she bled all the time.  Uses condoms.  Last month her period began 4 days earlier than it was scheduled to arrive. She has been on birth control pills in the past.  Does not wish to restart OCP's.   Recurrent BV.  Reports current symptoms consistent with previous BV episodes.   Review of Systems See HPI  Past Medical History  Diagnosis Date  . Chronic diarrhea   . Heart murmur     since childhood  . Rapid palpitations 07/08/2015  . Vasovagal syncope     Social History   Social History  . Marital Status: Single    Spouse Name: N/A  . Number of Children: N/A  . Years of Education: N/A   Occupational History  . Not on file.   Social History Main Topics  . Smoking status: Current Every Day Smoker -- 1.00 packs/day    Types: Cigarettes  . Smokeless tobacco: Never Used     Comment: thinking about quitting.  . Alcohol Use: 0.6 oz/week    1 Standard drinks or equivalent per week    . Drug Use: No  . Sexual Activity: Yes    Birth Control/ Protection: None   Other Topics Concern  . Not on file   Social History Narrative   Lives in [place] with her mother, Bre Getchell, and her brother, Lamount Cranker.  Identifies as [place]. Endorses tobacco use as well as rare THC use. Denies ETOH use. + sexually active. Has implanon. Getting GED at [employer] working a [employer]    Past Surgical History  Procedure Laterality Date  . None      Family History  Problem Relation Age of Onset  . Arrhythmia Maternal Grandmother   . Hypertension Maternal Grandfather   . Diabetes Maternal Grandfather   . Heart attack Maternal Grandfather   . Heart disease Maternal Grandfather   . Heart murmur Mother   . Melanoma Mother     stage 3 malignant melanoma  . Healthy Father   . Healthy Brother   . Other Maternal Aunt     TACHYCARDIA  . Diabetes Paternal Grandfather   . Arrhythmia Other     Aunt w/ tachyarrythmia at age 109  . Cancer Other     No Known Allergies  Current Outpatient Prescriptions on File Prior to Visit  Medication  Sig Dispense Refill  . Aspirin-Acetaminophen (GOODYS BODY PAIN PO) Take 1 tablet by mouth as needed (headaches).     No current facility-administered medications on file prior to visit.    BP 124/65 mmHg  Pulse 63  Temp(Src) 98.4 F (36.9 C) (Oral)  Resp 16  Ht 5' 4.5" (1.638 m)  Wt 151 lb 9.6 oz (68.765 kg)  BMI 25.63 kg/m2  SpO2 100%  LMP 11/25/2015       Objective:   Physical Exam  Constitutional: She is oriented to person, place, and time. She appears well-developed and well-nourished.  HENT:  Head: Normocephalic and atraumatic.  Eyes: Pupils are equal, round, and reactive to light. No scleral icterus.  Cardiovascular: Normal rate, regular rhythm and normal heart sounds.   No murmur heard. Pulmonary/Chest: Effort normal and breath sounds normal. No respiratory distress. She has no wheezes.  Musculoskeletal: She  exhibits no edema.  Lymphadenopathy:    She has no cervical adenopathy.  Neurological: She is alert and oriented to person, place, and time.  Skin: Skin is warm and dry.  Psychiatric: She has a normal mood and affect. Her behavior is normal. Judgment and thought content normal.          Assessment & Plan:  Weight loss- obtain TSH, discussed healthy diet, exercise to prevent further weight gain.   30 min spent with pt.  >50% of this time was spent counseling patient on anxiety, diet, exercise, anxiety.

## 2015-11-30 LAB — TSH: TSH: 0.96 u[IU]/mL (ref 0.35–4.50)

## 2015-11-30 LAB — PREGNANCY, URINE: Preg Test, Ur: NEGATIVE

## 2015-12-01 LAB — URINE CYTOLOGY ANCILLARY ONLY
Chlamydia: NEGATIVE
Neisseria Gonorrhea: NEGATIVE

## 2015-12-03 ENCOUNTER — Encounter: Payer: Self-pay | Admitting: Family

## 2015-12-03 DIAGNOSIS — R55 Syncope and collapse: Secondary | ICD-10-CM | POA: Insufficient documentation

## 2015-12-03 DIAGNOSIS — G43909 Migraine, unspecified, not intractable, without status migrainosus: Secondary | ICD-10-CM | POA: Insufficient documentation

## 2015-12-03 DIAGNOSIS — N760 Acute vaginitis: Secondary | ICD-10-CM | POA: Insufficient documentation

## 2015-12-03 LAB — URINE CYTOLOGY ANCILLARY ONLY: Candida vaginitis: NEGATIVE

## 2015-12-03 MED ORDER — METRONIDAZOLE 500 MG PO TABS: 500.0000 mg | ORAL_TABLET | Freq: Two times a day (BID) | ORAL | Status: DC

## 2015-12-03 NOTE — Assessment & Plan Note (Signed)
Pt feels like she is managing without meds at this time. Asked pt to let me know if her symptoms worsen.

## 2015-12-03 NOTE — Assessment & Plan Note (Signed)
Discussed plan as follows:  For migraines you may use ibuprofen 800mg  (use sparingly- avoid goody powders). For severe headache- try imitrex as needed.

## 2015-12-03 NOTE — Assessment & Plan Note (Signed)
Has had extensive w/u in the past.  No episodes in several years.  Followed by cardiology.

## 2015-12-03 NOTE — Assessment & Plan Note (Signed)
Obtain GC/Chlamydia- wet prep. Empiric rx with metronidazole. See mychart message 12/16.

## 2015-12-05 ENCOUNTER — Encounter: Payer: Self-pay | Admitting: Family

## 2015-12-07 ENCOUNTER — Other Ambulatory Visit: Payer: Self-pay

## 2015-12-07 ENCOUNTER — Ambulatory Visit (INDEPENDENT_AMBULATORY_CARE_PROVIDER_SITE_OTHER): Payer: Self-pay

## 2015-12-07 DIAGNOSIS — Z720 Tobacco use: Secondary | ICD-10-CM

## 2015-12-07 DIAGNOSIS — R002 Palpitations: Secondary | ICD-10-CM

## 2016-01-13 ENCOUNTER — Encounter: Payer: Self-pay | Admitting: Medical

## 2016-01-13 ENCOUNTER — Ambulatory Visit (INDEPENDENT_AMBULATORY_CARE_PROVIDER_SITE_OTHER): Payer: Self-pay | Admitting: Medical

## 2016-01-13 VITALS — BP 110/70 | HR 87 | Temp 98.1°F | Ht 64.5 in | Wt 152.0 lb

## 2016-01-13 DIAGNOSIS — R0981 Nasal congestion: Secondary | ICD-10-CM

## 2016-01-13 DIAGNOSIS — R059 Cough, unspecified: Secondary | ICD-10-CM

## 2016-01-13 DIAGNOSIS — J029 Acute pharyngitis, unspecified: Secondary | ICD-10-CM

## 2016-01-13 DIAGNOSIS — R05 Cough: Secondary | ICD-10-CM

## 2016-01-13 LAB — POCT RAPID STREP A (OFFICE): Rapid Strep A Screen: NEGATIVE

## 2016-01-13 MED ORDER — FLUTICASONE PROPIONATE 50 MCG/ACT NA SUSP: 2.0000 | Freq: Every day | NASAL | Status: DC

## 2016-01-13 MED ORDER — AZITHROMYCIN 250 MG PO TABS: ORAL_TABLET | ORAL | Status: DC

## 2016-01-13 NOTE — Patient Instructions (Signed)
Pharyngitis- likely strep by exam, history and associated ha and muscle aches.  Prescription of antibiotic azithromycin   For nasal congestion rx flonase. This may help with cough related to pnd.  Follow up in 7-10 days or as needed

## 2016-01-13 NOTE — Progress Notes (Signed)
Subjective:    Patient ID: Jenna [name], female    DOB: [birth year], 25 y.o.   MRN: [id number]  HPI  Pt has st, cough and nasal congestion. Worse feeling is st. She states feels like strep. Pt has ha with st. Also some back ache with st.   Some fever, chills and sweats.   St for 3 days.  Cough and  Nasal congested for 2 wks. Some sneezing as well.  Pt has some exposure to young children.  LMP- 3 weeks ago.    Review of Systems  Constitutional: Negative for fever, chills and fatigue.  HENT: Positive for congestion, sneezing and sore throat. Negative for sinus pressure.   Respiratory: Positive for cough. Negative for chest tightness, shortness of breath and wheezing.   Cardiovascular: Negative for chest pain and palpitations.  Gastrointestinal: Negative for abdominal pain.  Musculoskeletal: Positive for back pain.       Upper back myalgia  Neurological: Positive for headaches. Negative for dizziness, facial asymmetry, light-headedness and numbness.  Hematological: Negative for adenopathy. Does not bruise/bleed easily.  Psychiatric/Behavioral: Negative for behavioral problems and confusion.   Past Medical History  Diagnosis Date  . Chronic diarrhea   . Heart murmur     since childhood  . Rapid palpitations 07/08/2015  . Vasovagal syncope     Social History   Social History  . Marital Status: Single    Spouse Name: N/A  . Number of Children: N/A  . Years of Education: N/A   Occupational History  . Not on file.   Social History Main Topics  . Smoking status: Current Every Day Smoker -- 1.00 packs/day    Types: Cigarettes  . Smokeless tobacco: Never Used     Comment: thinking about quitting.  . Alcohol Use: 0.6 oz/week    1 Standard drinks or equivalent per week  . Drug Use: No  . Sexual Activity: Yes    Birth Control/ Protection: None   Other Topics Concern  . Not on file   Social History Narrative   Lives in [place] with her mother, Lyla Skora, and her  brother, Lamount Cranker.  Identifies as [place]. Endorses tobacco use as well as rare THC use. Denies ETOH use. + sexually active. Has implanon.     GED   Installs pools    Past Surgical History  Procedure Laterality Date  . None      Family History  Problem Relation Age of Onset  . Arrhythmia Maternal Grandmother   . Hypertension Maternal Grandfather   . Diabetes Maternal Grandfather   . Heart attack Maternal Grandfather   . Heart disease Maternal Grandfather   . Heart murmur Mother   . Melanoma Mother     stage 3 malignant melanoma  . Healthy Father   . Healthy Brother   . Other Maternal Aunt     TACHYCARDIA  . Diabetes Paternal Grandfather   . Arrhythmia Other     Aunt w/ tachyarrythmia at age 54  . Cancer Other     No Known Allergies  Current Outpatient Prescriptions on File Prior to Visit  Medication Sig Dispense Refill  . Aspirin-Acetaminophen (GOODYS BODY PAIN PO) Take 1 tablet by mouth as needed (headaches).    Marland Kitchen ibuprofen (ADVIL,MOTRIN) 800 MG tablet Take 1 tablet (800 mg total) by mouth every 8 (eight) hours as needed. 20 tablet 0   No current facility-administered medications on file prior to visit.    BP 110/70 mmHg  Pulse  87  Temp(Src) 98.1 F (36.7 C) (Oral)  Ht 5' 4.5" (1.638 m)  Wt 152 lb (68.947 kg)  BMI 25.70 kg/m2  SpO2 98%       Objective:   Physical Exam  General  Mental Status - Alert. General Appearance - Well groomed. Not in acute distress.  Skin Rashes- No Rashes.  HEENT Head- Normal. Ear Auditory Canal - Left- Normal. Right - Normal.Tympanic Membrane- Left- Normal. Right- Normal. Eye Sclera/Conjunctiva- Left- Normal. Right- Normal. Nose & Sinuses Nasal Mucosa- Left-  Boggy and Congested. Right-  Boggy and  Congested.Bilateral no maxillary and no frontal sinus pressure. Mouth & Throat Lips: Upper Lip- Normal: no dryness, cracking, pallor, cyanosis, or vesicular eruption. Lower Lip-Normal: no dryness,  cracking, pallor, cyanosis or vesicular eruption. Buccal Mucosa- Bilateral- No Aphthous ulcers. Oropharynx- No Discharge but Erythema. Tonsils: Characteristics- Bilateral- Erythema and  Congestion. Size/Enlargement- Bilateral- 1+  enlargement. Discharge- bilateral-None.  Neck Neck- Supple. No Masses. Bilateral submandibular node enlargement   Chest and Lung Exam Auscultation: Breath Sounds:-Clear even and unlabored.  Cardiovascular Auscultation:Rythm- Regular, rate and rhythm. Murmurs & Other Heart Sounds:Ausculatation of the heart reveal- No Murmurs.  Lymphatic Head & Neck General Head & Neck Lymphatics: Bilateral: Description- No Localized lymphadenopathy.       Assessment & Plan:  Pharyngitis- likely strep by exam, history and associated ha and muscle aches.  Prescription of antibiotic azithromycin   For nasal congestion rx flonase. This may help with cough related to pnd.  Follow up in 7-10 days or as needed  Pt rapid strep test was negative. Rapid strep test can be falsely negative. Treated by exam and clinical presentation. Notified pt of result before she left.

## 2016-01-13 NOTE — Progress Notes (Signed)
Pre visit review using our clinic review tool, if applicable. No additional management support is needed unless otherwise documented below in the visit note. 

## 2016-02-17 ENCOUNTER — Encounter: Payer: Self-pay | Admitting: Family

## 2016-02-18 MED ORDER — IBUPROFEN 800 MG PO TABS: 800.0000 mg | ORAL_TABLET | Freq: Three times a day (TID) | ORAL | Status: DC | PRN

## 2016-03-13 ENCOUNTER — Encounter: Payer: Self-pay | Admitting: Family

## 2016-03-13 MED ORDER — METRONIDAZOLE 500 MG PO TABS: 500.0000 mg | ORAL_TABLET | Freq: Two times a day (BID) | ORAL | Status: DC

## 2016-03-24 ENCOUNTER — Ambulatory Visit (INDEPENDENT_AMBULATORY_CARE_PROVIDER_SITE_OTHER): Payer: Self-pay | Admitting: Medical

## 2016-03-24 ENCOUNTER — Encounter: Payer: Self-pay | Admitting: Medical

## 2016-03-24 VITALS — BP 110/64 | HR 52 | Temp 98.3°F | Ht 64.5 in | Wt 155.0 lb

## 2016-03-24 DIAGNOSIS — H6691 Otitis media, unspecified, right ear: Secondary | ICD-10-CM

## 2016-03-24 DIAGNOSIS — J309 Allergic rhinitis, unspecified: Secondary | ICD-10-CM

## 2016-03-24 MED ORDER — AMOXICILLIN-POT CLAVULANATE 875-125 MG PO TABS: 1.0000 | ORAL_TABLET | Freq: Two times a day (BID) | ORAL | Status: DC

## 2016-03-24 MED ORDER — LORATADINE 10 MG PO TABS: 10.0000 mg | ORAL_TABLET | Freq: Every day | ORAL | Status: DC

## 2016-03-24 MED ORDER — FLUCONAZOLE 150 MG PO TABS: 150.0000 mg | ORAL_TABLET | Freq: Once | ORAL | Status: DC

## 2016-03-24 NOTE — Patient Instructions (Addendum)
For allergies and congestion that preceded ear infection will  rx claritin and continue your flonase.  For rt otitis media. RX of augmentin antibiotic.  Your should gradually get better if not let us know.  Follow up in 7-10 days or as needed

## 2016-03-24 NOTE — Progress Notes (Signed)
Subjective:    Patient ID: Jenna [name], female    DOB: [birth year], 25 y.o.   MRN: [id number]  HPI   Pt has one week of nasal congestion, runny nose, sneezing and cough . No eye itching. Pt thought was allergies.  Ear pressure and clogging for 2 days. One day decreased hearing. No pain unless she touches.   No fever, no chills, no sweats and no myalgias.  LMP- 2 wks ago.  Pt has not been using her flonase.  Past Medical History  Diagnosis Date  . Chronic diarrhea   . Heart murmur     since childhood  . Rapid palpitations 07/08/2015  . Vasovagal syncope     Social History   Social History  . Marital Status: Single    Spouse Name: N/A  . Number of Children: N/A  . Years of Education: N/A   Occupational History  . Not on file.   Social History Main Topics  . Smoking status: Current Every Day Smoker -- 1.00 packs/day    Types: Cigarettes  . Smokeless tobacco: Never Used     Comment: thinking about quitting.  . Alcohol Use: 0.6 oz/week    1 Standard drinks or equivalent per week  . Drug Use: No  . Sexual Activity: Yes    Birth Control/ Protection: None   Other Topics Concern  . Not on file   Social History Narrative   Lives in [place] with her mother, Marymar Vanderplaats, and her brother, Lamount Cranker.  Identifies as [place]. Endorses tobacco use as well as rare THC use. Denies ETOH use. + sexually active. Has implanon.     GED   Installs pools    Past Surgical History  Procedure Laterality Date  . None      Family History  Problem Relation Age of Onset  . Arrhythmia Maternal Grandmother   . Hypertension Maternal Grandfather   . Diabetes Maternal Grandfather   . Heart attack Maternal Grandfather   . Heart disease Maternal Grandfather   . Heart murmur Mother   . Melanoma Mother     stage 3 malignant melanoma  . Healthy Father   . Healthy Brother   . Other Maternal Aunt     TACHYCARDIA  . Diabetes Paternal Grandfather   . Arrhythmia Other       Aunt w/ tachyarrythmia at age 51  . Cancer Other     No Known Allergies  Current Outpatient Prescriptions on File Prior to Visit  Medication Sig Dispense Refill  . Aspirin-Acetaminophen (GOODYS BODY PAIN PO) Take 1 tablet by mouth as needed (headaches).    Marland Kitchen ibuprofen (ADVIL,MOTRIN) 800 MG tablet Take 1 tablet (800 mg total) by mouth every 8 (eight) hours as needed. 20 tablet 0   No current facility-administered medications on file prior to visit.    BP 110/64 mmHg  Pulse 52  Temp(Src) 98.3 F (36.8 C) (Oral)  Ht 5' 4.5" (1.638 m)  Wt 155 lb (70.308 kg)  BMI 26.20 kg/m2  SpO2 99%  LMP 03/17/2016    Review of Systems  Constitutional: Negative for fever, chills and fatigue.  HENT: Positive for congestion, ear pain and sneezing. Negative for sinus pressure.   Respiratory: Positive for cough. Negative for chest tightness, shortness of breath and wheezing.        Some productive cough.  Cardiovascular: Negative for chest pain and palpitations.  Gastrointestinal: Negative for abdominal pain and blood in stool.  Musculoskeletal: Negative for back pain  and neck pain.  Skin: Negative for rash.  Neurological: Negative for dizziness and headaches.  Hematological: Negative for adenopathy. Does not bruise/bleed easily.  Psychiatric/Behavioral: Negative for behavioral problems and confusion.       Objective:   Physical Exam  General  Mental Status - Alert. General Appearance - Well groomed. Not in acute distress.  Skin Rashes- No Rashes.  HEENT Head- Normal. Ear Auditory Canal - Left- Normal. Right - Normal.Tympanic Membrane- Left- Normal. Right- red and buldging tm Eye Sclera/Conjunctiva- Left- Normal. Right- Normal. Nose & Sinuses Nasal Mucosa- Left-  Boggy and Congested. Right-  Boggy and  Congested.Bilateral no  maxillary and no  frontal sinus pressure. Mouth & Throat Lips: Upper Lip- Normal: no dryness, cracking, pallor, cyanosis, or vesicular eruption. Lower  Lip-Normal: no dryness, cracking, pallor, cyanosis or vesicular eruption. Buccal Mucosa- Bilateral- No Aphthous ulcers. Oropharynx- No Discharge or Erythema. Tonsils: Characteristics- Bilateral- No Erythema or Congestion. Size/Enlargement- Bilateral- No enlargement. Discharge- bilateral-None.  Neck Neck- Supple. No Masses.   Chest and Lung Exam Auscultation: Breath Sounds:-Clear even and unlabored.  Cardiovascular Auscultation:Rythm- Regular, rate and rhythm. Murmurs & Other Heart Sounds:Ausculatation of the heart reveal- No Murmurs.  Lymphatic Head & Neck General Head & Neck Lymphatics: Bilateral: Description- No Localized lymphadenopathy.       Assessment & Plan:  For allergies rx claritin and continue your flonase.  You do appear to have rt otitis media. RX of augmentin antibiotic.  Your should gradually get better if not let us know.  Follow up in 7-10 days or as needed

## 2016-03-24 NOTE — Progress Notes (Signed)
Pre visit review using our clinic review tool, if applicable. No additional management support is needed unless otherwise documented below in the visit note. 

## 2016-04-24 ENCOUNTER — Ambulatory Visit (INDEPENDENT_AMBULATORY_CARE_PROVIDER_SITE_OTHER): Payer: Self-pay | Admitting: Family Medicine

## 2016-04-24 ENCOUNTER — Encounter: Payer: Self-pay | Admitting: Family Medicine

## 2016-04-24 VITALS — BP 118/60 | HR 70 | Temp 98.3°F | Ht 64.5 in | Wt 152.4 lb

## 2016-04-24 DIAGNOSIS — N764 Abscess of vulva: Secondary | ICD-10-CM

## 2016-04-24 MED ORDER — DOXYCYCLINE HYCLATE 100 MG PO CAPS: 100.0000 mg | ORAL_CAPSULE | Freq: Two times a day (BID) | ORAL | Status: DC

## 2016-04-24 MED ORDER — HYDROCODONE-ACETAMINOPHEN 5-325 MG PO TABS: 1.0000 | ORAL_TABLET | Freq: Four times a day (QID) | ORAL | Status: DC | PRN

## 2016-04-24 NOTE — Patient Instructions (Signed)
Please come and see me Wednesday morning at 9:15 for wound care. In the meantime keep the area clean and dry. Change your bandage as needed. You can wear a pad if needed for drainage. There is some packing in the wound- try not to pull it out when you change your bandage.   Use the pain medication if needed- remember it will make you sleepy!  Also use the antibiotic as directed- take with food and water If you are feeling worse- more pain, fever or any other concerns let me know!

## 2016-04-24 NOTE — Progress Notes (Signed)
[place] at [facility] [address], [place], [place] [address] [phone]  Date:  04/24/2016   Name:  Jenna [name]   DOB:  [birth year]   MRN:  [id number]  PCP:  Nance Pear., NP    Chief Complaint: Vaginal Inflammation   History of Present Illness:  Jenna [name] is a 25 y.o. very pleasant female patient who presents with the following:  Here today with a concern about vulvar pain. Took augmentin last month for 10 days for an ear infection. She also tooka diflucan and did not notice any sign of a yeast infection.  This seems to be a different problems.  LMP 4/14 She first noted a sore area in her groin later last week- Wed or Thursday. Today is Monday. She has noted very painful blisters, it burns and hurts to pee.  The area has gotten worse over the weekend and now she can barley walk or touch the area She has felt a little feverish but has not measured a fever  No vomiting or belly pain.  She does not have dysuria but it is very painful to wipe the area.  She does not have any suspicion of pregnancy  She has had mild folliculitis in this area in the past, never anything like this though  Patient Active Problem List   Diagnosis Date Noted  . Vaginitis 12/03/2015  . Vasovagal syncope 12/03/2015  . Migraines 12/03/2015  . Rapid palpitations 07/08/2015  . Screening examination for sexually transmitted disease 10/16/2014  . Tattoo of skin 10/26/2011  . Anxiety 09/22/2011  . DIARRHEA, CHRONIC 08/31/2010    Past Medical History  Diagnosis Date  . Chronic diarrhea   . Heart murmur     since childhood  . Rapid palpitations 07/08/2015  . Vasovagal syncope     Past Surgical History  Procedure Laterality Date  . None      Social History  Substance Use Topics  . Smoking status: Current Every Day Smoker -- 1.00 packs/day    Types: Cigarettes  . Smokeless tobacco: Never Used     Comment: thinking about quitting.  .  Alcohol Use: 0.6 oz/week    1 Standard drinks or equivalent per week    Family History  Problem Relation Age of Onset  . Arrhythmia Maternal Grandmother   . Hypertension Maternal Grandfather   . Diabetes Maternal Grandfather   . Heart attack Maternal Grandfather   . Heart disease Maternal Grandfather   . Heart murmur Mother   . Melanoma Mother     stage 3 malignant melanoma  . Healthy Father   . Healthy Brother   . Other Maternal Aunt     TACHYCARDIA  . Diabetes Paternal Grandfather   . Arrhythmia Other     Aunt w/ tachyarrythmia at age 37  . Cancer Other     No Known Allergies  Medication list has been reviewed and updated.  Current Outpatient Prescriptions on File Prior to Visit  Medication Sig Dispense Refill  . amoxicillin-clavulanate (AUGMENTIN) 875-125 MG tablet Take 1 tablet by mouth 2 (two) times daily. 20 tablet 0  . Aspirin-Acetaminophen (GOODYS BODY PAIN PO) Take 1 tablet by mouth as needed (headaches).    . fluconazole (DIFLUCAN) 150 MG tablet Take 1 tablet (150 mg total) by mouth once. 1 tablet 0  . ibuprofen (ADVIL,MOTRIN) 800 MG tablet Take 1 tablet (800 mg total) by mouth every 8 (eight) hours as needed. 20 tablet 0  .  loratadine (CLARITIN) 10 MG tablet Take 1 tablet (10 mg total) by mouth daily. 30 tablet 1   No current facility-administered medications on file prior to visit.    Review of Systems:  As per HPI- otherwise negative.   Physical Examination: Filed Vitals:   04/24/16 1105  BP: 134/59  Pulse: 74  Temp: 98.3 F (36.8 C)   Filed Vitals:   04/24/16 1105  Height: 5' 4.5" (1.638 m)  Weight: 152 lb 6.4 oz (69.128 kg)   Body mass index is 25.76 kg/(m^2). Ideal Body Weight: Weight in (lb) to have BMI = 25: 147.6  GEN: WDWN, NAD, Non-toxic, A & O x 3, looks well but uncomfortable HEENT: Atraumatic, Normocephalic. Neck supple. No masses, No LAD. Ears and Nose: No external deformity. CV: RRR, No M/G/R. No JVD. No thrill. No extra heart  sounds. PULM: CTA B, no wheezes, crackles, rhonchi. No retractions. No resp. distress. No accessory muscle use. ABD: S, NT, ND EXTR: No c/c/e NEURO Normal gait.  Gu: large obvious abscess of the left labia majora, with pointing and fluctuance. Very tender to palpation. Did not perform internal exam due to pain.  PSYCH: Normally interactive. Conversant. Not depressed or anxious appearing.  Calm demeanor.   VC obtained. Prepped area with betadine and alcohol.  Anesthesia with 3-4 ml of 2% lido.  I and D with 11 blade, copious pus expressed and cultured. Packed with approx 2" of 1/4" packing and dressed Pt tolerated well and felt relief  Assessment and Plan: Abscess of labia majora - Plan: doxycycline (VIBRAMYCIN) 100 MG capsule, HYDROcodone-acetaminophen (NORCO/VICODIN) 5-325 MG tablet, Wound culture  Here today with an abscess of the left labia. I and D as above and start on abx, pain medication as needed.  Recheck visit in 48 hours- she will seek care sooner if any worsening   Signed Lamar Blinks, MD

## 2016-04-26 ENCOUNTER — Encounter: Payer: Self-pay | Admitting: Family Medicine

## 2016-04-26 ENCOUNTER — Ambulatory Visit (INDEPENDENT_AMBULATORY_CARE_PROVIDER_SITE_OTHER): Payer: Self-pay | Admitting: Family Medicine

## 2016-04-26 VITALS — BP 118/61 | HR 71 | Temp 99.0°F | Ht 64.0 in | Wt 153.0 lb

## 2016-04-26 DIAGNOSIS — Z5189 Encounter for other specified aftercare: Secondary | ICD-10-CM

## 2016-04-26 NOTE — Patient Instructions (Signed)
Please see me tomorrow afternoon for a wound check. Ask the ladies up front to put you on my schedule tomorrow- I have 5 or 6 pm.   No charge today

## 2016-04-26 NOTE — Progress Notes (Signed)
[place] at [facility] [address], [place], [place] [address] [phone]  Date:  04/26/2016   Name:  Jenna [name]   DOB:  [birth year]   MRN:  [id number]  PCP:  Nance Pear., NP    Chief Complaint: Follow-up   History of Present Illness:  Jenna [name] is a 25 y.o. very pleasant female patient who presents with the following:  She was here 2 days ago for an I and D of a left labial abscess.  No fever, no aches or flu like symptoms.  The pain is much better overall, she is still a bit swollen. Taking doxycycline without difficulty.  She is now also on her menstrual cycle  Patient Active Problem List   Diagnosis Date Noted  . Vaginitis 12/03/2015  . Vasovagal syncope 12/03/2015  . Migraines 12/03/2015  . Rapid palpitations 07/08/2015  . Screening examination for sexually transmitted disease 10/16/2014  . Tattoo of skin 10/26/2011  . Anxiety 09/22/2011  . DIARRHEA, CHRONIC 08/31/2010    Past Medical History  Diagnosis Date  . Chronic diarrhea   . Heart murmur     since childhood  . Rapid palpitations 07/08/2015  . Vasovagal syncope     Past Surgical History  Procedure Laterality Date  . None      Social History  Substance Use Topics  . Smoking status: Current Every Day Smoker -- 1.00 packs/day    Types: Cigarettes  . Smokeless tobacco: Never Used     Comment: thinking about quitting.  . Alcohol Use: 0.6 oz/week    1 Standard drinks or equivalent per week    Family History  Problem Relation Age of Onset  . Arrhythmia Maternal Grandmother   . Hypertension Maternal Grandfather   . Diabetes Maternal Grandfather   . Heart attack Maternal Grandfather   . Heart disease Maternal Grandfather   . Heart murmur Mother   . Melanoma Mother     stage 3 malignant melanoma  . Healthy Father   . Healthy Brother   . Other Maternal Aunt     TACHYCARDIA  . Diabetes Paternal Grandfather   . Arrhythmia Other    Aunt w/ tachyarrythmia at age 66  . Cancer Other     No Known Allergies  Medication list has been reviewed and updated.  Current Outpatient Prescriptions on File Prior to Visit  Medication Sig Dispense Refill  . Aspirin-Acetaminophen (GOODYS BODY PAIN PO) Take 1 tablet by mouth as needed (headaches).    . doxycycline (VIBRAMYCIN) 100 MG capsule Take 1 capsule (100 mg total) by mouth 2 (two) times daily. 20 capsule 0  . HYDROcodone-acetaminophen (NORCO/VICODIN) 5-325 MG tablet Take 1 tablet by mouth every 6 (six) hours as needed. 12 tablet 0  . ibuprofen (ADVIL,MOTRIN) 800 MG tablet Take 1 tablet (800 mg total) by mouth every 8 (eight) hours as needed. 20 tablet 0  . loratadine (CLARITIN) 10 MG tablet Take 1 tablet (10 mg total) by mouth daily. 30 tablet 1   No current facility-administered medications on file prior to visit.    Review of Systems:  As per HPI- otherwise negative.   Physical Examination: Filed Vitals:   04/26/16 0930  BP: 118/61  Pulse: 71  Temp: 99 F (37.2 C)   Filed Vitals:   04/26/16 0930  Height: 5\' 4"  (1.626 m)  Weight: 153 lb (69.4 kg)   Body mass index is 26.25 kg/(m^2). Ideal Body Weight: Weight in (lb) to  have BMI = 25: 145.3   GEN: WDWN, NAD, Non-toxic, Alert & Oriented x 3 HEENT: Atraumatic, Normocephalic.  Ears and Nose: No external deformity. EXTR: No clubbing/cyanosis/edema NEURO: Normal gait.  PSYCH: Normally interactive. Conversant. Not depressed or anxious appearing.  Calm demeanor.  Left labia- abscess is much better, much reduced swelling. Still firm and a bit swollen around the wound site. Removed packing, irrigated with 78ml of 2% lidocaine and placed a new piece of 1/4"packing, approx 1inch, dressed wound  Assessment and Plan: Encounter for wound care  Wound care as above She will come in tomorrow for a wound check  Signed Lamar Blinks, MD

## 2016-04-26 NOTE — Progress Notes (Signed)
Pre visit review using our clinic tool,if applicable. No additional management support is needed unless otherwise documented below in the visit note.  

## 2016-04-27 ENCOUNTER — Encounter: Payer: Self-pay | Admitting: Family Medicine

## 2016-04-27 ENCOUNTER — Ambulatory Visit (INDEPENDENT_AMBULATORY_CARE_PROVIDER_SITE_OTHER): Payer: Self-pay | Admitting: Family Medicine

## 2016-04-27 VITALS — BP 129/67 | HR 84 | Temp 98.6°F

## 2016-04-27 DIAGNOSIS — Z5189 Encounter for other specified aftercare: Secondary | ICD-10-CM

## 2016-04-27 NOTE — Progress Notes (Signed)
Pre visit review using our clinic tool,if applicable. No additional management support is needed unless otherwise documented below in the visit note.  

## 2016-04-27 NOTE — Progress Notes (Signed)
[place] at [facility] [address], [place], [place] [address] [phone] [id number] [phone]  Date:  04/27/2016   Name:  Jenna [name]   DOB:  Jan 05, 1991   MRN:  [id number]  PCP:  Nance Pear., NP    Chief Complaint: Follow-up   History of Present Illness:  Jenna [name] is a 25 y.o. very pleasant female patient who presents with the following:  Here today for wound care- we did an I and D of a left labial abscess for her earlier this week.  She feels that she is doing great, the swelling and pain are much reduced.  She does have a HA today which she thinks may be from taking doxy on an empty stomach  Patient Active Problem List   Diagnosis Date Noted  . Vaginitis 12/03/2015  . Vasovagal syncope 12/03/2015  . Migraines 12/03/2015  . Rapid palpitations 07/08/2015  . Screening examination for sexually transmitted disease 10/16/2014  . Tattoo of skin 10/26/2011  . Anxiety 09/22/2011  . DIARRHEA, CHRONIC 08/31/2010    Past Medical History  Diagnosis Date  . Chronic diarrhea   . Heart murmur     since childhood  . Rapid palpitations 07/08/2015  . Vasovagal syncope     Past Surgical History  Procedure Laterality Date  . None      Social History  Substance Use Topics  . Smoking status: Current Every Day Smoker -- 1.00 packs/day    Types: Cigarettes  . Smokeless tobacco: Never Used     Comment: thinking about quitting.  . Alcohol Use: 0.6 oz/week    1 Standard drinks or equivalent per week    Family History  Problem Relation Age of Onset  . Arrhythmia Maternal Grandmother   . Hypertension Maternal Grandfather   . Diabetes Maternal Grandfather   . Heart attack Maternal Grandfather   . Heart disease Maternal Grandfather   . Heart murmur Mother   . Melanoma Mother     stage 3 malignant melanoma  . Healthy Father   . Healthy Brother   . Other Maternal Aunt     TACHYCARDIA  . Diabetes Paternal Grandfather   . Arrhythmia  Other     Aunt w/ tachyarrythmia at age 66  . Cancer Other     No Known Allergies  Medication list has been reviewed and updated.  Current Outpatient Prescriptions on File Prior to Visit  Medication Sig Dispense Refill  . Aspirin-Acetaminophen (GOODYS BODY PAIN PO) Take 1 tablet by mouth as needed (headaches).    . doxycycline (VIBRAMYCIN) 100 MG capsule Take 1 capsule (100 mg total) by mouth 2 (two) times daily. 20 capsule 0  . HYDROcodone-acetaminophen (NORCO/VICODIN) 5-325 MG tablet Take 1 tablet by mouth every 6 (six) hours as needed. 12 tablet 0  . ibuprofen (ADVIL,MOTRIN) 800 MG tablet Take 1 tablet (800 mg total) by mouth every 8 (eight) hours as needed. 20 tablet 0  . loratadine (CLARITIN) 10 MG tablet Take 1 tablet (10 mg total) by mouth daily. 30 tablet 1   No current facility-administered medications on file prior to visit.    Review of Systems:  As per HPI- otherwise negative.   Physical Examination: Filed Vitals:   04/27/16 1748  BP: 129/67  Pulse: 84  Temp: 98.6 F (37 C)   There were no vitals filed for this visit. There is no weight on file to calculate BMI. Ideal Body Weight:     GEN: WDWN, NAD,  Non-toxic, Alert & Oriented x 3 HEENT: Atraumatic, Normocephalic.  Ears and Nose: No external deformity. EXTR: No clubbing/cyanosis/edema NEURO: Normal gait.  PSYCH: Normally interactive. Conversant. Not depressed or anxious appearing.  Calm demeanor.  Left labia- removed dressing and small piece of packing. No discharge or pus from wound. Replaced band-aid  Assessment and Plan: Encounter for wound care  Here today for wound care.  Doing well, does not need to RTC unless any problems come up See patient instructions for more details.     Signed Lamar Blinks, MD

## 2016-04-27 NOTE — Patient Instructions (Signed)
We removed the packing from your wound today- it looks great!   Continue to keep it clean and dry, and cover with a band- aid or light dressing as needed Let me know if you do not continue to do well. Assuming you are healing well you can stop the antibiotics after a week

## 2016-05-12 ENCOUNTER — Telehealth: Payer: Self-pay | Admitting: Emergency Medicine

## 2016-05-12 NOTE — Telephone Encounter (Signed)
-----   Message from Darreld Mclean, MD sent at 05/11/2016  8:39 PM EDT ----- Can you please ask lab to see whatever happened with her wound culture? I never got a final result for her. Thanks!

## 2016-05-12 NOTE — Telephone Encounter (Signed)
Culture, Wound Report Status: Final  GRAM STAIN: Abundant WBC present-both PMN and Mononuclear No Squamous Epithelial Cells Seen Abundant Gram Positive Cocci In Pairs In Clusters Few Gram Negative Rods  FINAL REPORT: Multiple Organisms Present, None Predominant No Staphylococcus aureus isolated No GROUP A STREP (S. PYOGENES) ISOLATED    Results will be scanned into patient's chart.

## 2016-06-02 ENCOUNTER — Encounter: Payer: Self-pay | Admitting: Family Medicine

## 2016-07-17 ENCOUNTER — Telehealth: Payer: Self-pay | Admitting: Family

## 2016-07-17 ENCOUNTER — Encounter: Payer: Self-pay | Admitting: Family

## 2016-07-17 ENCOUNTER — Ambulatory Visit: Payer: Self-pay | Admitting: Family

## 2016-07-17 ENCOUNTER — Ambulatory Visit (INDEPENDENT_AMBULATORY_CARE_PROVIDER_SITE_OTHER): Payer: Self-pay | Admitting: Family

## 2016-07-17 DIAGNOSIS — N912 Amenorrhea, unspecified: Secondary | ICD-10-CM

## 2016-07-17 DIAGNOSIS — Z3201 Encounter for pregnancy test, result positive: Secondary | ICD-10-CM

## 2016-07-17 DIAGNOSIS — Z3491 Encounter for supervision of normal pregnancy, unspecified, first trimester: Secondary | ICD-10-CM

## 2016-07-17 LAB — POCT URINE HCG BY VISUAL COLOR COMPARISON TESTS: Preg Test, Ur: POSITIVE — AB

## 2016-07-17 MED ORDER — PRENATAL VITAMINS 0.8 MG PO TABS: 1.0000 | ORAL_TABLET | Freq: Every day | ORAL | Status: DC

## 2016-07-17 NOTE — Telephone Encounter (Signed)
When pt came in (self pay)paid with cash. Processed pt's pt of 72.00 twice (144.) totaling.   Credited back 72.00 to acct. Pt paid with  100.00 bill. Gave pt change of 28.00.

## 2016-07-17 NOTE — Progress Notes (Signed)
Subjective:    Patient ID: Jenna [name], female    DOB: December 09, 1991, 25 y.o.   MRN: [id number]  HPI   Jenna [name] is a 25 yr old female who presents today to discuss a positive home pregnancy test. LMP was 06/15/16. Reports some nausea.  Just started a prenatal vitamin. Has cut down on her smoking and plans to completely stop. Boyfriend is "in shock" but supportive. She does not have an OB/GYN.  She has not used birth control for 2 years.      Review of Systems See HPI  Past Medical History:  Diagnosis Date  . Chronic diarrhea   . Heart murmur    since childhood  . Rapid palpitations 07/08/2015  . Vasovagal syncope      Social History   Social History  . Marital status: Single    Spouse name: N/A  . Number of children: N/A  . Years of education: N/A   Occupational History  . Not on file.   Social History Main Topics  . Smoking status: Current Every Day Smoker    Packs/day: 1.00    Types: Cigarettes  . Smokeless tobacco: Never Used     Comment: thinking about quitting.  . Alcohol use 0.6 oz/week    1 Standard drinks or equivalent per week  . Drug use: No  . Sexual activity: Yes    Birth control/ protection: None   Other Topics Concern  . Not on file   Social History Narrative   Lives in [place] with her mother, Jenna [name], and her brother, Jenna [name].  Identifies as [place]. Endorses tobacco use as well as rare THC use. Denies ETOH use. + sexually active. Has implanon.     GED   Installs pools    Past Surgical History:  Procedure Laterality Date  . None      Family History  Problem Relation Age of Onset  . Arrhythmia Maternal Grandmother   . Hypertension Maternal Grandfather   . Diabetes Maternal Grandfather   . Heart attack Maternal Grandfather   . Heart disease Maternal Grandfather   . Heart murmur Mother   . Melanoma Mother     stage 3 malignant melanoma  . Healthy Father   . Healthy Brother   . Other Maternal Aunt     TACHYCARDIA   . Diabetes Paternal Grandfather   . Arrhythmia Other     Aunt w/ tachyarrythmia at age 51  . Cancer Other     No Known Allergies  Current Outpatient Prescriptions on File Prior to Visit  Medication Sig Dispense Refill  . Aspirin-Acetaminophen (GOODYS BODY PAIN PO) Take 1 tablet by mouth as needed (headaches).     No current facility-administered medications on file prior to visit.     BP 126/70   Pulse 83   Temp 98.6 F (37 C) (Oral)   Resp 16   Ht 5\' 4"  (1.626 m)   Wt 158 lb (71.7 kg)   LMP 06/15/2016   SpO2 98% Comment: room air  BMI 27.12 kg/m       Objective:   Physical Exam  Constitutional: She is oriented to person, place, and time. She appears well-developed and well-nourished.  HENT:  Head: Normocephalic and atraumatic.  Cardiovascular:  No murmur heard. Pulmonary/Chest: No respiratory distress. She has no wheezes.  Neurological: She is alert and oriented to person, place, and time.  Psychiatric: She has a normal mood and affect. Her behavior is normal. Judgment and  thought content normal.          Assessment & Plan:  1st trimester pregnancy- + urine pregnancy test today in our office. patient counseled to continue prenatal vitamin, quit smoking completely, avoid alcohol, undercooked meats/seafood, lunch meats, limit high mercury fish consumption, limit caffeine intake. Check with provider prior to taking OTC meds. Refer to OB/GYN for prenatal care.  20 min spent with pt today. >50% of this time was spent on pregnancy counseling.

## 2016-07-17 NOTE — Patient Instructions (Signed)
Continue prenatal vitamin. You will be contacted about your referral to  OB/GYN.   First Trimester of Pregnancy The first trimester of pregnancy is from week 1 until the end of week 12 (months 1 through 3). A week after a sperm fertilizes an egg, the egg will implant on the wall of the uterus. This embryo will begin to develop into a baby. Genes from you and your partner are forming the baby. The female genes determine whether the baby is a boy or a girl. At 6-8 weeks, the eyes and face are formed, and the heartbeat can be seen on ultrasound. At the end of 12 weeks, all the baby's organs are formed.  Now that you are pregnant, you will want to do everything you can to have a healthy baby. Two of the most important things are to get good prenatal care and to follow your health care provider's instructions. Prenatal care is all the medical care you receive before the baby's birth. This care will help prevent, find, and treat any problems during the pregnancy and childbirth. BODY CHANGES Your body goes through many changes during pregnancy. The changes vary from woman to woman.   You may gain or lose a couple of pounds at first.  You may feel sick to your stomach (nauseous) and throw up (vomit). If the vomiting is uncontrollable, call your health care provider.  You may tire easily.  You may develop headaches that can be relieved by medicines approved by your health care provider.  You may urinate more often. Painful urination may mean you have a bladder infection.  You may develop heartburn as a result of your pregnancy.  You may develop constipation because certain hormones are causing the muscles that push waste through your intestines to slow down.  You may develop hemorrhoids or swollen, bulging veins (varicose veins).  Your breasts may begin to grow larger and become tender. Your nipples may stick out more, and the tissue that surrounds them (areola) may become darker.  Your gums may  bleed and may be sensitive to brushing and flossing.  Dark spots or blotches (chloasma, mask of pregnancy) may develop on your face. This will likely fade after the baby is born.  Your menstrual periods will stop.  You may have a loss of appetite.  You may develop cravings for certain kinds of food.  You may have changes in your emotions from day to day, such as being excited to be pregnant or being concerned that something may go wrong with the pregnancy and baby.  You may have more vivid and strange dreams.  You may have changes in your hair. These can include thickening of your hair, rapid growth, and changes in texture. Some women also have hair loss during or after pregnancy, or hair that feels dry or thin. Your hair will most likely return to normal after your baby is born. WHAT TO EXPECT AT YOUR PRENATAL VISITS During a routine prenatal visit:  You will be weighed to make sure you and the baby are growing normally.  Your blood pressure will be taken.  Your abdomen will be measured to track your baby's growth.  The fetal heartbeat will be listened to starting around week 10 or 12 of your pregnancy.  Test results from any previous visits will be discussed. Your health care provider may ask you:  How you are feeling.  If you are feeling the baby move.  If you have had any abnormal symptoms, such as leaking  fluid, bleeding, severe headaches, or abdominal cramping.  If you are using any tobacco products, including cigarettes, chewing tobacco, and electronic cigarettes.  If you have any questions. Other tests that may be performed during your first trimester include:  Blood tests to find your blood type and to check for the presence of any previous infections. They will also be used to check for low iron levels (anemia) and Rh antibodies. Later in the pregnancy, blood tests for diabetes will be done along with other tests if problems develop.  Urine tests to check for  infections, diabetes, or protein in the urine.  An ultrasound to confirm the proper growth and development of the baby.  An amniocentesis to check for possible genetic problems.  Fetal screens for spina bifida and Down syndrome.  You may need other tests to make sure you and the baby are doing well.  HIV (human immunodeficiency virus) testing. Routine prenatal testing includes screening for HIV, unless you choose not to have this test. HOME CARE INSTRUCTIONS  Medicines  Follow your health care provider's instructions regarding medicine use. Specific medicines may be either safe or unsafe to take during pregnancy.  Take your prenatal vitamins as directed.  If you develop constipation, try taking a stool softener if your health care provider approves. Diet  Eat regular, well-balanced meals. Choose a variety of foods, such as meat or vegetable-based protein, fish, milk and low-fat dairy products, vegetables, fruits, and whole grain breads and cereals. Your health care provider will help you determine the amount of weight gain that is right for you.  Avoid raw meat and uncooked cheese. These carry germs that can cause birth defects in the baby.  Eating four or five small meals rather than three large meals a day may help relieve nausea and vomiting. If you start to feel nauseous, eating a few soda crackers can be helpful. Drinking liquids between meals instead of during meals also seems to help nausea and vomiting.  If you develop constipation, eat more high-fiber foods, such as fresh vegetables or fruit and whole grains. Drink enough fluids to keep your urine clear or pale yellow. Activity and Exercise  Exercise only as directed by your health care provider. Exercising will help you:  Control your weight.  Stay in shape.  Be prepared for labor and delivery.  Experiencing pain or cramping in the lower abdomen or low back is a good sign that you should stop exercising. Check with  your health care provider before continuing normal exercises.  Try to avoid standing for long periods of time. Move your legs often if you must stand in one place for a long time.  Avoid heavy lifting.  Wear low-heeled shoes, and practice good posture.  You may continue to have sex unless your health care provider directs you otherwise. Relief of Pain or Discomfort  Wear a good support bra for breast tenderness.   Take warm sitz baths to soothe any pain or discomfort caused by hemorrhoids. Use hemorrhoid cream if your health care provider approves.   Rest with your legs elevated if you have leg cramps or low back pain.  If you develop varicose veins in your legs, wear support hose. Elevate your feet for 15 minutes, 3-4 times a day. Limit salt in your diet. Prenatal Care  Schedule your prenatal visits by the twelfth week of pregnancy. They are usually scheduled monthly at first, then more often in the last 2 months before delivery.  Write down your questions. Take  them to your prenatal visits.  Keep all your prenatal visits as directed by your health care provider. Safety  Wear your seat belt at all times when driving.  Make a list of emergency phone numbers, including numbers for family, friends, the hospital, and police and fire departments. General Tips  Ask your health care provider for a referral to a local prenatal education class. Begin classes no later than at the beginning of month 6 of your pregnancy.  Ask for help if you have counseling or nutritional needs during pregnancy. Your health care provider can offer advice or refer you to specialists for help with various needs.  Do not use hot tubs, steam rooms, or saunas.  Do not douche or use tampons or scented sanitary pads.  Do not cross your legs for long periods of time.  Avoid cat litter boxes and soil used by cats. These carry germs that can cause birth defects in the baby and possibly loss of the fetus by  miscarriage or stillbirth.  Avoid all smoking, herbs, alcohol, and medicines not prescribed by your health care provider. Chemicals in these affect the formation and growth of the baby.  Do not use any tobacco products, including cigarettes, chewing tobacco, and electronic cigarettes. If you need help quitting, ask your health care provider. You may receive counseling support and other resources to help you quit.  Schedule a dentist appointment. At home, brush your teeth with a soft toothbrush and be gentle when you floss. SEEK MEDICAL CARE IF:   You have dizziness.  You have mild pelvic cramps, pelvic pressure, or nagging pain in the abdominal area.  You have persistent nausea, vomiting, or diarrhea.  You have a bad smelling vaginal discharge.  You have pain with urination.  You notice increased swelling in your face, hands, legs, or ankles. SEEK IMMEDIATE MEDICAL CARE IF:   You have a fever.  You are leaking fluid from your vagina.  You have spotting or bleeding from your vagina.  You have severe abdominal cramping or pain.  You have rapid weight gain or loss.  You vomit blood or material that looks like coffee grounds.  You are exposed to [place] measles and have never had them.  You are exposed to fifth disease or chickenpox.  You develop a severe headache.  You have shortness of breath.  You have any kind of trauma, such as from a fall or a car accident.   This information is not intended to replace advice given to you by your health care provider. Make sure you discuss any questions you have with your health care provider.   Document Released: 11/28/2001 Document Revised: 12/25/2014 Document Reviewed: 10/14/2013 Elsevier Interactive Patient Education [employer].

## 2016-07-18 ENCOUNTER — Encounter: Payer: Self-pay | Admitting: Family

## 2016-07-19 ENCOUNTER — Telehealth: Payer: Self-pay | Admitting: Family

## 2016-07-19 NOTE — Telephone Encounter (Signed)
Pt missed early appt yesterday and came in later, charge or no charge?

## 2016-07-19 NOTE — Telephone Encounter (Signed)
No charge. 

## 2016-11-17 ENCOUNTER — Emergency Department (HOSPITAL_COMMUNITY)
Admission: EM | Admit: 2016-11-17 | Discharge: 2016-11-17 | Disposition: A | Discharge status: Home / Self Care | Payer: Self-pay | Attending: Emergency Medicine | Admitting: Emergency Medicine

## 2016-11-17 ENCOUNTER — Emergency Department (HOSPITAL_COMMUNITY): Payer: Self-pay

## 2016-11-17 ENCOUNTER — Encounter (HOSPITAL_COMMUNITY): Payer: Self-pay | Admitting: Emergency Medicine

## 2016-11-17 DIAGNOSIS — R002 Palpitations: Secondary | ICD-10-CM | POA: Insufficient documentation

## 2016-11-17 DIAGNOSIS — F17210 Nicotine dependence, cigarettes, uncomplicated: Secondary | ICD-10-CM | POA: Insufficient documentation

## 2016-11-17 DIAGNOSIS — R0602 Shortness of breath: Secondary | ICD-10-CM

## 2016-11-17 MED ORDER — METOPROLOL TARTRATE 25 MG PO TABS: 25.0000 mg | ORAL_TABLET | Freq: Two times a day (BID) | ORAL | 0 refills | Status: DC

## 2016-11-17 MED ORDER — SODIUM CHLORIDE 0.9 % IV SOLN: INTRAVENOUS | Status: DC

## 2016-11-17 NOTE — ED Triage Notes (Signed)
Per EMS, Pt reports having palpitations. Pt's family used pulse ox, read 167 heart rate. Pt states she felt like she was going to pass out.  Pt reports her left arm and top of back started to feel numb. Pt denies chest pain, headache.

## 2016-11-17 NOTE — Discharge Instructions (Signed)
Follow up with your cardiologist  

## 2016-11-17 NOTE — ED Provider Notes (Signed)
[facility] DEPT Provider Note   CSN: [id number] Arrival date & time: 11/17/16  2124     History   Chief Complaint Chief Complaint  Patient presents with  . Tachycardia    HPI Jenna [name] is a 25 y.o. female.  25 year old female presents with sudden onset of palpitations while at rest. Has had this before in the past and was diagnosed with SVT. Patient was seen by cardiology and was referred for possible ablation in the future versus being on beta blockers. She denies any caffeine use at this time. No illicit drug use. Symptoms last for about 20 minutes and have since resolved. She denies any recent illnesses. Feels back to her baseline      Past Medical History:  Diagnosis Date  . Chronic diarrhea   . Heart murmur    since childhood  . Rapid palpitations 07/08/2015  . Vasovagal syncope     Patient Active Problem List   Diagnosis Date Noted  . Vaginitis 12/03/2015  . Vasovagal syncope 12/03/2015  . Migraines 12/03/2015  . Rapid palpitations 07/08/2015  . Screening examination for sexually transmitted disease 10/16/2014  . Tattoo of skin 10/26/2011  . Anxiety 09/22/2011  . DIARRHEA, CHRONIC 08/31/2010    Past Surgical History:  Procedure Laterality Date  . WISDOM TOOTH EXTRACTION      OB History    No data available       Home Medications    Prior to Admission medications   Medication Sig Start Date End Date Taking? Authorizing Provider  ibuprofen (ADVIL,MOTRIN) 200 MG tablet Take 400 mg by mouth every 6 (six) hours as needed.   Yes Historical Provider, MD  norgestimate-ethinyl estradiol ([place] 28) 0.25-35 MG-MCG tablet Take 1 tablet by mouth daily.   Yes Historical Provider, MD    Family History Family History  Problem Relation Age of Onset  . Arrhythmia Maternal Grandmother   . Hypertension Maternal Grandfather   . Diabetes Maternal Grandfather   . Heart attack Maternal Grandfather   . Heart disease Maternal Grandfather   . Heart murmur  Mother   . Melanoma Mother     stage 3 malignant melanoma  . Healthy Father   . Healthy Brother   . Other Maternal Aunt     TACHYCARDIA  . Diabetes Paternal Grandfather   . Arrhythmia Other     Aunt w/ tachyarrythmia at age 21  . Cancer Other     Social History Social History  Substance Use Topics  . Smoking status: Current Every Day Smoker    Packs/day: 1.00    Types: Cigarettes  . Smokeless tobacco: Never Used     Comment: thinking about quitting.  . Alcohol use 0.6 oz/week    1 Standard drinks or equivalent per week     Allergies   Adhesive [tape]   Review of Systems Review of Systems  All other systems reviewed and are negative.    Physical Exam Updated Vital Signs Ht 5\' 4"  (1.626 m)   Wt 71.7 kg   LMP 10/25/2016 (Approximate)   SpO2 100%   BMI 27.12 kg/m   Physical Exam  Constitutional: She is oriented to person, place, and time. She appears well-developed and well-nourished.  Non-toxic appearance. No distress.  HENT:  Head: Normocephalic and atraumatic.  Eyes: Conjunctivae, EOM and lids are normal. Pupils are equal, round, and reactive to light.  Neck: Normal range of motion. Neck supple. No tracheal deviation present. No thyroid mass present.  Cardiovascular: Normal rate, regular rhythm and  normal heart sounds.  Exam reveals no gallop.   No murmur heard. Pulmonary/Chest: Effort normal and breath sounds normal. No stridor. No respiratory distress. She has no decreased breath sounds. She has no wheezes. She has no rhonchi. She has no rales.  Abdominal: Soft. Normal appearance and bowel sounds are normal. She exhibits no distension. There is no tenderness. There is no rebound and no CVA tenderness.  Musculoskeletal: Normal range of motion. She exhibits no edema or tenderness.  Neurological: She is alert and oriented to person, place, and time. She has normal strength. No cranial nerve deficit or sensory deficit. GCS eye subscore is 4. GCS verbal subscore is  5. GCS motor subscore is 6.  Skin: Skin is warm and dry. No abrasion and no rash noted.  Psychiatric: She has a normal mood and affect. Her speech is normal and behavior is normal.  Nursing note and vitals reviewed.    ED Treatments / Results  Labs (all labs ordered are listed, but only abnormal results are displayed) Labs Reviewed - No data to display  EKG  EKG Interpretation  Date/Time:  Friday November 17 2016 21:37:40 EST Ventricular Rate:  96 PR Interval:    QRS Duration: 90 QT Interval:  360 QTC Calculation: 455 R Axis:   83 Text Interpretation:  Sinus rhythm Right atrial enlargement Confirmed by Zenia Resides  MD, Emmelyn Schmale ([id number]) on 11/17/2016 9:43:42 PM       Radiology Dg Chest 2 View  Result Date: 11/17/2016 CLINICAL DATA:  Acute onset of tachycardia.  Initial encounter. EXAM: CHEST  2 VIEW COMPARISON:  Chest radiograph performed 06/26/2015 FINDINGS: The lungs are well-aerated and clear. There is no evidence of focal opacification, pleural effusion or pneumothorax. The heart is normal in size; the mediastinal contour is within normal limits. No acute osseous abnormalities are seen. IMPRESSION: No acute cardiopulmonary process seen. Electronically Signed   By: Garald Balding M.D.   On: 11/17/2016 22:30    Procedures Procedures (including critical care time)  Medications Ordered in ED Medications  0.9 %  sodium chloride infusion (not administered)     Initial Impression / Assessment and Plan / ED Course  I have reviewed the triage vital signs and the nursing notes.  Pertinent labs & imaging results that were available during my care of the patient were reviewed by me and considered in my medical decision making (see chart for details).  Clinical Course     Patient's EKG shows sinus rhythm. Chest x-ray without acute findings. Suspect that this was SVT and will prescribe her when necessary Lopressor  Final Clinical Impressions(s) / ED Diagnoses   Final diagnoses:    SOB (shortness of breath)    New Prescriptions New Prescriptions   No medications on file     Lacretia Leigh, MD 11/17/16 2301

## 2016-11-19 ENCOUNTER — Telehealth: Payer: Self-pay | Admitting: Family

## 2016-11-19 NOTE — Telephone Encounter (Signed)
Please contact pt and let her know that reviewed her ER record and think she should follow back up with cardiology. Please advise her to contact them for a follow up appointment.

## 2016-11-20 ENCOUNTER — Telehealth: Payer: Self-pay | Admitting: Nurse Practitioner

## 2016-11-20 MED ORDER — METOPROLOL TARTRATE 25 MG PO TABS: 25.0000 mg | ORAL_TABLET | Freq: Two times a day (BID) | ORAL | 0 refills | Status: DC

## 2016-11-20 NOTE — Telephone Encounter (Signed)
This looks to be just to be taken "prn palpitations".   Ok to give one month supply with NO refills - needs visit arranged.

## 2016-11-20 NOTE — Telephone Encounter (Signed)
Left detailed message on cell voicemail and sent mychart message to pt.

## 2016-11-20 NOTE — Telephone Encounter (Signed)
Left message for patient to call back. Sent in refill to [facility] on [address].

## 2016-11-20 NOTE — Telephone Encounter (Signed)
New message  Pt was given metaprolol 25mg  but only 5 day supply  Should pt wait until her appt before she takes them?  Please call back and advise

## 2016-12-04 NOTE — Progress Notes (Addendum)
CARDIOLOGY OFFICE NOTE  Date:  12/05/2016    Jenna [name] Date of Birth: 08/26/1991 Medical Record [id number]  PCP:  Nance Pear., NP  Cardiologist:  Rosanne Sack    Chief Complaint  Patient presents with  . Palpitations    Follow up visit - seen for Dr. Radford Pax    History of Present Illness: Jenna [name] is a 25 y.o. female who presents today for a 15 month check. Seen for Dr. Radford Pax.   She has a history of ongoing tobacco abuse and palpitations. Has had a monitor. No insurance and thus did not get echocardiogram. Other issue includes chronic diarrhea.   Last seen back December - she had had a spell of presyncope. Still smoking. Was finally able to get an echo. She has no insurance.   In the ER earlier this month with sudden onset of palpitations - negative evaluation - no labs noted to have been drawn.  Suspected to have been SVT - apparently she noted to the ER physician that she had had SVT before and was referred for possible ablation??? I am not aware of this. She was given beta blocker therapy. Her monitor back in August of 2016 showed occasional PAC's - no worrisome arrhythmia.   Comes back today. Here with her mother. She continues to note tachy palpitations. Says her HR is variable. Sometimes low and sometimes fast. No real caffeine use. Still smoking. No supplements. Did not get the metoprolol filled - not really interested in taking that every day. Asking if she had AF or not - I do not see this on prior EKG.   Past Medical History:  Diagnosis Date  . Chronic diarrhea   . Heart murmur    since childhood  . Rapid palpitations 07/08/2015  . Vasovagal syncope     Past Surgical History:  Procedure Laterality Date  . WISDOM TOOTH EXTRACTION       Medications: Current Outpatient Prescriptions  Medication Sig Dispense Refill  . ibuprofen (ADVIL,MOTRIN) 200 MG tablet Take 400 mg by mouth every 6 (six) hours as needed.    . norgestimate-ethinyl  estradiol (SPRINTEC 28) 0.25-35 MG-MCG tablet Take 1 tablet by mouth daily.    . propranolol (INDERAL) 10 MG tablet Take 1 tablet (10 mg total) by mouth 4 (four) times daily as needed (palpitations). 30 tablet 6   No current facility-administered medications for this visit.     Allergies: Allergies  Allergen Reactions  . Adhesive [Tape] Itching and Rash    Blistering of skin      Social History: The patient  reports that she has been smoking Cigarettes.  She has been smoking about 1.00 pack per day. She has never used smokeless tobacco. She reports that she drinks about 0.6 oz of alcohol per week . She reports that she does not use drugs.   Family History: The patient's family history includes Arrhythmia in her maternal grandmother and other; Cancer in her other; Diabetes in her maternal grandfather and paternal grandfather; Healthy in her brother and father; Heart attack in her maternal grandfather; Heart disease in her maternal grandfather; Heart murmur in her mother; Hypertension in her maternal grandfather; Melanoma in her mother; Other in her maternal aunt.   Review of Systems: Please see the history of present illness.   Otherwise, the review of systems is positive for none.   All other systems are reviewed and negative.   Physical Exam: VS:  BP 128/80   Pulse 78  Ht 5\' 4"  (1.626 m)   Wt 160 lb 12.8 oz (72.9 kg)   LMP 10/25/2016 (Approximate)   SpO2 100% Comment: at rest  BMI 27.60 kg/m  .  BMI Body mass index is 27.6 kg/m.  Wt Readings from Last 3 Encounters:  12/05/16 160 lb 12.8 oz (72.9 kg)  11/17/16 158 lb (71.7 kg)  07/17/16 158 lb (71.7 kg)    General: Pleasant. She is alert and in no acute distress.  She smells of tobacco.  HEENT: Normal.  Neck: Supple, no JVD, carotid bruits, or masses noted.  Cardiac: Regular rate and rhythm. No murmurs, rubs, or gallops. No edema.  Respiratory:  Lungs are clear to auscultation bilaterally with normal work of breathing.    GI: Soft and nontender.  MS: No deformity or atrophy. Gait and ROM intact.  Skin: Warm and dry. Color is normal.  Neuro:  Strength and sensation are intact and no gross focal deficits noted.  Psych: Alert, appropriate and with normal affect.   LABORATORY DATA:  EKG:  EKG is ordered today. EKG from earlier this month from the ER showed NSR.  EKG today shows NSR.   Lab Results  Component Value Date   WBC 7.7 06/26/2015   HGB 13.8 06/26/2015   HCT 40.7 06/26/2015   PLT 265 06/26/2015   GLUCOSE 102 (H) 06/26/2015   ALT 10 08/10/2011   AST 17 08/10/2011   NA 139 06/26/2015   K 3.5 06/26/2015   CL 107 06/26/2015   CREATININE 0.78 06/26/2015   BUN 7 06/26/2015   CO2 24 06/26/2015   TSH 0.96 11/29/2015    BNP (last 3 results) No results for input(s): BNP in the last 8760 hours.  ProBNP (last 3 results) No results for input(s): PROBNP in the last 8760 hours.   Other Studies Reviewed Today:  Echo Study Conclusions from 11/2015  - Left ventricle: The cavity size was normal. Systolic function was   normal. The estimated ejection fraction was in the range of 60%   to 65%. Wall motion was normal; there were no regional wall   motion abnormalities. Left ventricular diastolic function   parameters were normal. - Left atrium: The atrium was normal in size. - Right ventricle: Systolic function was normal. - Pulmonary arteries: Systolic pressure was within the normal   range.  Impressions:  - Normal study.   Holter Study Highlights from August 2016     Normal sinus rhythm to sinus bradycardia. Heart rate ranged from 43-92 bpm.  Sinus arrhythmia  Occasional PAC's     Assessment/Plan: 1. Palpitations - nothing worrisome on past cardiac monitor. Was given low dose beta blocker therapy - she does not wish to take - would recommend prn Inderal instead. Would have her try to get the AliveCor app for her smart phone and document her HR. May need EP referral but for  now we need more documentation. Will get lab today.   3. FH of heart issues. She has had a normal echo. I do not feel this has changed over the past year.   4. Tobacco abuse - advised to abstain.   Current medicines are reviewed with the patient today.  The patient does not have concerns regarding medicines other than what has been noted above.  The following changes have been made:  See above.  Labs/ tests ordered today include:    Orders Placed This Encounter  Procedures  . COMPLETE METABOLIC PANEL WITH GFR  . TSH  . CBC  .  Magnesium     Disposition:   FU here prn.   Patient is agreeable to this plan and will call if any problems develop in the interim.   Signed: Burtis Junes, RN, ANP-C 12/05/2016 9:07 AM  [facility] [address] [place], [place]  [address] Phone: [phone] Fax: [phone]

## 2016-12-05 ENCOUNTER — Encounter: Payer: Self-pay | Admitting: Nurse Practitioner

## 2016-12-05 ENCOUNTER — Ambulatory Visit (INDEPENDENT_AMBULATORY_CARE_PROVIDER_SITE_OTHER): Payer: Self-pay | Admitting: Nurse Practitioner

## 2016-12-05 VITALS — BP 128/80 | HR 78 | Ht 64.0 in | Wt 160.8 lb

## 2016-12-05 DIAGNOSIS — R002 Palpitations: Secondary | ICD-10-CM

## 2016-12-05 LAB — CBC
HCT: 37.5 % (ref 35.0–45.0)
Hemoglobin: 12.5 g/dL (ref 11.7–15.5)
MCH: 30.1 pg (ref 27.0–33.0)
MCHC: 33.3 g/dL (ref 32.0–36.0)
MCV: 90.4 fL (ref 80.0–100.0)
MPV: 10 fL (ref 7.5–12.5)
Platelets: 251 10*3/uL (ref 140–400)
RBC: 4.15 MIL/uL (ref 3.80–5.10)
RDW: 12.4 % (ref 11.0–15.0)
WBC: 6.7 10*3/uL (ref 3.8–10.8)

## 2016-12-05 LAB — COMPLETE METABOLIC PANEL WITH GFR
ALT: 11 U/L (ref 6–29)
AST: 13 U/L (ref 10–30)
Albumin: 4.3 g/dL (ref 3.6–5.1)
Alkaline Phosphatase: 39 U/L (ref 33–115)
BUN: 11 mg/dL (ref 7–25)
CO2: 26 mmol/L (ref 20–31)
Calcium: 9.3 mg/dL (ref 8.6–10.2)
Chloride: 106 mmol/L (ref 98–110)
Creat: 0.79 mg/dL (ref 0.50–1.10)
GFR, Est African American: >89 mL/min (ref >=60)
GFR, Est Non African American: >89 mL/min (ref >=60)
Glucose, Bld: 90 mg/dL (ref 65–99)
Potassium: 3.7 mmol/L (ref 3.5–5.3)
Sodium: 140 mmol/L (ref 135–146)
Total Bilirubin: 0.4 mg/dL (ref 0.2–1.2)
Total Protein: 6.7 g/dL (ref 6.1–8.1)

## 2016-12-05 LAB — MAGNESIUM: Magnesium: 1.9 mg/dL (ref 1.5–2.5)

## 2016-12-05 LAB — TSH: TSH: 0.5 mIU/L

## 2016-12-05 MED ORDER — PROPRANOLOL HCL 10 MG PO TABS: 10.0000 mg | ORAL_TABLET | Freq: Four times a day (QID) | ORAL | 6 refills | Status: DC | PRN

## 2016-12-05 NOTE — Patient Instructions (Addendum)
We will be checking the following labs today - CMET, CBC, TSH and Mg level   Medication Instructions:    Continue with your current medicines. BUT  I would NOT start the metoprolol  I have sent in a RX for Inderal 10 mg to take up to 4 times a day as needed.     Testing/Procedures To Be Arranged:  Try to get the AliveCor EKG app for your phone.   Follow-Up:   See [place] back as needed.     Other Special Instructions:   N/A    If you need a refill on your cardiac medications before your next appointment, please call your pharmacy.   Call the [place] office at [phone] if you have any questions, problems or concerns.

## 2016-12-06 ENCOUNTER — Other Ambulatory Visit: Payer: Self-pay | Admitting: *Deleted

## 2016-12-06 DIAGNOSIS — R002 Palpitations: Secondary | ICD-10-CM

## 2017-03-26 ENCOUNTER — Encounter: Payer: Self-pay | Admitting: Family

## 2017-03-27 ENCOUNTER — Other Ambulatory Visit: Payer: Self-pay | Admitting: Family

## 2017-03-27 MED ORDER — VALACYCLOVIR HCL 1 G PO TABS: ORAL_TABLET | ORAL | 5 refills | Status: DC

## 2017-05-23 ENCOUNTER — Encounter: Payer: Self-pay | Admitting: Family Medicine

## 2017-05-23 ENCOUNTER — Ambulatory Visit (INDEPENDENT_AMBULATORY_CARE_PROVIDER_SITE_OTHER): Payer: BLUE CROSS/BLUE SHIELD | Admitting: Family Medicine

## 2017-05-23 VITALS — BP 116/74 | HR 78 | Temp 99.1°F | Ht 64.0 in | Wt 159.4 lb

## 2017-05-23 DIAGNOSIS — W57XXXA Bitten or stung by nonvenomous insect and other nonvenomous arthropods, initial encounter: Secondary | ICD-10-CM

## 2017-05-23 DIAGNOSIS — S40862A Insect bite (nonvenomous) of left upper arm, initial encounter: Secondary | ICD-10-CM | POA: Diagnosis not present

## 2017-05-23 MED ORDER — TRIAMCINOLONE ACETONIDE 0.5 % EX OINT: 1.0000 "application " | TOPICAL_OINTMENT | Freq: Two times a day (BID) | CUTANEOUS | 0 refills | Status: DC

## 2017-05-23 MED ORDER — DOXYCYCLINE HYCLATE 100 MG PO TABS: 100.0000 mg | ORAL_TABLET | Freq: Two times a day (BID) | ORAL | 0 refills | Status: DC

## 2017-05-23 NOTE — Progress Notes (Signed)
Jenna [name] is a 26 y.o. female here for an acute visit.  History of Present Illness:   Jenna [name] CMA acting as scribe for Dr. Juleen [name].  HPI Patient comes in today for a bite that she got yesterday. Bite has gotten larger in size and has heat in it. It is measuring 7 cm in diameter. Patient has also started having a headache today. She never saw what bit her. Works outside. Mother had RMSF from tick bite at home in the past. No treatment.  PMHx, SurgHx, SocialHx, Medications, and Allergies were reviewed in the Visit Navigator and updated as appropriate.  Current Medications:   .  ibuprofen (ADVIL,MOTRIN) 200 MG tablet, Take 400 mg by mouth every 6 (six) hours as needed., Disp: , Rfl:  .  norgestimate-ethinyl estradiol (SPRINTEC 28) 0.25-35 MG-MCG tablet, Take 1 tablet by mouth daily., Disp: , Rfl:  .  propranolol (INDERAL) 10 MG tablet, Take 1 tablet (10 mg total) by mouth 4 (four) times daily as needed (palpitations)., Disp: 30 tablet, Rfl: 6 .  valACYclovir (VALTREX) 1000 MG tablet, 2 tabs by mouth now and again in 12 hours., Disp: 4 tablet, Rfl: 5   Allergies  Allergen Reactions  . Adhesive [Tape] Itching and Rash    Blistering of skin     Review of Systems:   Review of Systems  Constitutional: Negative for chills, fever and malaise/fatigue.  HENT: Negative for ear pain, sinus pain and sore throat.   Eyes: Negative for blurred vision and double vision.  Respiratory: Negative for cough, shortness of breath and wheezing.   Cardiovascular: Negative for chest pain, palpitations and leg swelling.  Gastrointestinal: Negative for abdominal pain, nausea and vomiting.  Musculoskeletal: Negative for back pain, joint pain and neck pain.  Neurological: Positive for headaches. Negative for dizziness.  Psychiatric/Behavioral: Negative for depression, hallucinations and memory loss.   Vitals:   Vitals:   05/23/17 1341  BP: 116/74  Pulse: 78  Temp: 99.1 F (37.3 C)  TempSrc:  Oral  SpO2: 98%  Weight: 159 lb 6.4 oz (72.3 kg)  Height: 5\' 4"  (1.626 m)     Body mass index is 27.36 kg/m.  Physical Exam:   Physical Exam  Constitutional: She appears well-developed and well-nourished.  HENT:  Head: Normocephalic and atraumatic.  Eyes: EOM are normal. Pupils are equal, round, and reactive to light.  Neck: Normal range of motion. Neck supple.  Cardiovascular: Normal rate, regular rhythm, normal heart sounds and intact distal pulses.   Pulmonary/Chest: Effort normal.  Abdominal: Soft.  Musculoskeletal:       Arms: Skin: Skin is warm.  Psychiatric: She has a normal mood and affect. Her behavior is normal.  Nursing note and vitals reviewed.   Assessment and Plan:   Jenna [name] was seen today for insect bite.  Diagnoses and all orders for this visit:  Insect bite of left upper arm with local reaction, initial encounter Comments: Likely localized histamine reaction. No sign of infection. Topical triamcinolone Rx. Okay OTC Benadryl prn. Since endemic area for RMSF, okay to cover with Doxy.  Orders: -     doxycycline (VIBRA-TABS) 100 MG tablet; Take 1 tablet (100 mg total) by mouth 2 (two) times daily. -     triamcinolone ointment (KENALOG) 0.5 %; Apply 1 application topically 2 (two) times daily.   . Reviewed expectations re: course of current medical issues. . Discussed self-management of symptoms. . Outlined signs and symptoms indicating need for more acute intervention. . Patient verbalized understanding  and all questions were answered. Marland Kitchen Health Maintenance issues including appropriate healthy diet, exercise, and smoking avoidance were discussed with patient. . See orders for this visit as documented in the electronic medical record. . Patient received an After Visit Summary.  CMA served as Education administrator during this visit. History, Physical, and Plan performed by medical provider. The above documentation has been reviewed and is accurate and complete. Jenna [name],  D.O.  Jenna Deutscher, DO [place] 05/23/2017  No future appointments.

## 2017-06-27 ENCOUNTER — Other Ambulatory Visit: Payer: Self-pay | Admitting: Family

## 2017-06-27 ENCOUNTER — Encounter: Payer: Self-pay | Admitting: Family

## 2017-06-27 MED ORDER — NORGESTIMATE-ETH ESTRADIOL 0.25-35 MG-MCG PO TABS: 1.0000 | ORAL_TABLET | Freq: Every day | ORAL | 0 refills | Status: DC

## 2017-06-27 NOTE — Telephone Encounter (Signed)
Jenna [name]-- looks like last visit was 07/17/16. Last pap smear 10/16/14.  Does pt need routine OV or cpe w/pap smear?

## 2017-07-27 ENCOUNTER — Ambulatory Visit (INDEPENDENT_AMBULATORY_CARE_PROVIDER_SITE_OTHER): Payer: Self-pay | Admitting: Family

## 2017-07-27 ENCOUNTER — Other Ambulatory Visit (HOSPITAL_COMMUNITY)
Admission: RE | Admit: 2017-07-27 | Discharge: 2017-07-27 | Disposition: A | Discharge status: Home / Self Care | Payer: Self-pay | Source: Ambulatory Visit | Attending: Family Medicine | Admitting: Family Medicine

## 2017-07-27 ENCOUNTER — Encounter: Payer: Self-pay | Admitting: Family

## 2017-07-27 VITALS — BP 122/62 | HR 73 | Temp 98.9°F | Resp 16 | Ht 64.0 in | Wt 159.2 lb

## 2017-07-27 DIAGNOSIS — Z23 Encounter for immunization: Secondary | ICD-10-CM

## 2017-07-27 DIAGNOSIS — Z01419 Encounter for gynecological examination (general) (routine) without abnormal findings: Secondary | ICD-10-CM

## 2017-07-27 DIAGNOSIS — Z808 Family history of malignant neoplasm of other organs or systems: Secondary | ICD-10-CM

## 2017-07-27 DIAGNOSIS — Z0000 Encounter for general adult medical examination without abnormal findings: Secondary | ICD-10-CM

## 2017-07-27 LAB — CBC WITH DIFFERENTIAL/PLATELET
BASOS PCT: 0 %
Basophils Absolute: 0 cells/uL (ref 0–200)
EOS ABS: 148 {cells}/uL (ref 15–500)
Eosinophils Relative: 2 %
HCT: 37.2 % (ref 35.0–45.0)
Hemoglobin: 12.5 g/dL (ref 11.7–15.5)
LYMPHS ABS: 2294 {cells}/uL (ref 850–3900)
Lymphocytes Relative: 31 %
MCH: 30 pg (ref 27.0–33.0)
MCHC: 33.6 g/dL (ref 32.0–36.0)
MCV: 89.4 fL (ref 80.0–100.0)
MONO ABS: 444 {cells}/uL (ref 200–950)
MPV: 10.1 fL (ref 7.5–12.5)
Monocytes Relative: 6 %
NEUTROS PCT: 61 %
Neutro Abs: 4514 cells/uL (ref 1500–7800)
PLATELETS: 246 10*3/uL (ref 140–400)
RBC: 4.16 MIL/uL (ref 3.80–5.10)
RDW: 12.5 % (ref 11.0–15.0)
WBC: 7.4 10*3/uL (ref 3.8–10.8)

## 2017-07-27 LAB — TSH: TSH: 0.49 mIU/L

## 2017-07-27 MED ORDER — BUPROPION HCL 100 MG PO TABS: 100.0000 mg | ORAL_TABLET | Freq: Two times a day (BID) | ORAL | 1 refills | Status: DC

## 2017-07-27 MED ORDER — NORGESTIMATE-ETH ESTRADIOL 0.25-35 MG-MCG PO TABS: 1.0000 | ORAL_TABLET | Freq: Every day | ORAL | 11 refills | Status: DC

## 2017-07-27 NOTE — Progress Notes (Signed)
Subjective:    Patient ID: Jenna [name], female    DOB: [birth year], 26 y.o.   MRN: [id number]  HPI  Patient presents today for complete physical. Unfortunately she is currently without insurance.  Immunizations: Tdap 07/02/08 Diet: diet has improved Exercise: does at home exercises.   Pap Smear: 10/15,  due Vision: <2 years ago.  Dental: up to date  Wt Readings from Last 3 Encounters:  07/27/17 159 lb 3.2 oz (72.2 kg)  05/23/17 159 lb 6.4 oz (72.3 kg)  12/05/16 160 lb 12.8 oz (72.9 kg)      Review of Systems  Constitutional: Negative for unexpected weight change.  HENT: Negative for hearing loss, nosebleeds and rhinorrhea.   Eyes: Negative for visual disturbance.  Respiratory: Negative for cough.   Cardiovascular: Negative for leg swelling.  Gastrointestinal: Negative for constipation and diarrhea.  Genitourinary: Negative for dysuria and frequency.  Musculoskeletal: Negative for arthralgias and myalgias.  Skin: Negative for rash.  Neurological:       Reports that she had daily Ha's last week x 4 days,Now resolved  Hematological: Bruises/bleeds easily.  Psychiatric/Behavioral:       Reports good mood. Anxiety is average       Past Medical History:  Diagnosis Date  . Chronic diarrhea   . Heart murmur    since childhood  . Rapid palpitations 07/08/2015  . Vasovagal syncope      Social History   Social History  . Marital status: Single    Spouse name: N/A  . Number of children: N/A  . Years of education: N/A   Occupational History  . Not on file.   Social History Main Topics  . Smoking status: Current Every Day Smoker    Packs/day: 1.00    Types: Cigarettes  . Smokeless tobacco: Never Used     Comment: thinking about quitting.  . Alcohol use 0.6 oz/week    1 Standard drinks or equivalent per week     Comment: 2 beers/week  . Drug use: No  . Sexual activity: Yes    Birth control/ protection: None   Other Topics Concern  . Not on file   Social  History Narrative   Lives in [place] with her mother, Timika Muench, and her brother, Lamount Cranker.  Identifies as [place]. Endorses tobacco use as well as rare THC use. Denies ETOH use. + sexually active. Has implanon.     GED   Installs pools       Past Surgical History:  Procedure Laterality Date  . WISDOM TOOTH EXTRACTION      Family History  Problem Relation Age of Onset  . Arrhythmia Maternal Grandmother   . Hypertension Maternal Grandfather   . Diabetes Maternal Grandfather   . Heart attack Maternal Grandfather   . Heart disease Maternal Grandfather   . Heart murmur Mother   . Melanoma Mother        stage 3 malignant melanoma  . Healthy Father   . Healthy Brother   . Scoliosis Brother   . Other Maternal Aunt        TACHYCARDIA  . Diabetes Paternal Grandfather   . Arrhythmia Other        Aunt w/ tachyarrythmia at age 64  . Cancer Other     Allergies  Allergen Reactions  . Adhesive [Tape] Itching and Rash    Blistering of skin      Current Outpatient Prescriptions on File Prior to Visit  Medication Sig Dispense  Refill  . ibuprofen (ADVIL,MOTRIN) 200 MG tablet Take 400 mg by mouth every 6 (six) hours as needed.    . propranolol (INDERAL) 10 MG tablet Take 1 tablet (10 mg total) by mouth 4 (four) times daily as needed (palpitations). 30 tablet 6  . valACYclovir (VALTREX) 1000 MG tablet 2 tabs by mouth now and again in 12 hours. 4 tablet 5   No current facility-administered medications on file prior to visit.     BP 122/62 (BP Location: Left Arm, Cuff Size: Normal)   Pulse 73   Temp 98.9 F (37.2 C) (Oral)   Resp 16   Ht 5\' 4"  (1.626 m)   Wt 159 lb 3.2 oz (72.2 kg)   LMP 06/28/2017   SpO2 100%   BMI 27.33 kg/m    Objective:   Physical Exam  Physical Exam  Constitutional: She is oriented to person, place, and time. She appears well-developed and well-nourished. No distress.  HENT:  Head: Normocephalic and atraumatic.  Right Ear:  Tympanic membrane and ear canal normal.  Left Ear: Tympanic membrane and ear canal normal.  Mouth/Throat: Oropharynx is clear and moist.  Eyes: Pupils are equal, round, and reactive to light. No scleral icterus.  Neck: Normal range of motion. No thyromegaly present.  Cardiovascular: Normal rate and regular rhythm.   + murmur heard. grade 1 systolic Pulmonary/Chest: Effort normal and breath sounds normal. No respiratory distress. He has no wheezes. She has no rales. She exhibits no tenderness.  Abdominal: Soft. Bowel sounds are normal. She exhibits no distension and no mass. There is no tenderness. There is no rebound and no guarding.  Musculoskeletal: She exhibits no edema.  Lymphadenopathy:    She has no cervical adenopathy.  Neurological: She is alert and oriented to person, place, and time. She has normal patellar reflexes. She exhibits normal muscle tone. Coordination normal.  Skin: Skin is warm and dry.  Psychiatric: She has a normal mood and affect. Her behavior is normal. Judgment and thought content normal.  Breasts: Examined lying Right: Without masses, retractions, discharge or axillary adenopathy.  Left: Without masses, retractions, discharge or axillary adenopathy.  Inguinal/mons: Normal without inguinal adenopathy  External genitalia: Normal  BUS/Urethra/Skene's glands: Normal  Bladder: Normal  Vagina: Normal  Cervix: Normal  Uterus: normal in size, shape and contour. Midline and mobile  Adnexa/parametria:  Rt: Without masses or tenderness.  Lt: Without masses or tenderness.  Anus and perineum: Normal            Assessment & Plan:   Preventative- discussed healthy diet, exercise, weight loss. Td today. Will obtain CBC and TSH due to fatigue and bruising. Will hold off on additional labs at this time due to cost since she is currently uninsured. Pap smear is performed today.      Assessment & Plan:

## 2017-07-27 NOTE — Patient Instructions (Signed)
Start wellbutrin twice daily to help you quit smoking. You can also add nicorette patch 21 mcg once daily.

## 2017-07-31 LAB — CYTOLOGY - PAP: Diagnosis: NEGATIVE

## 2017-08-17 ENCOUNTER — Encounter: Payer: Self-pay | Admitting: Medical

## 2017-08-17 ENCOUNTER — Ambulatory Visit (INDEPENDENT_AMBULATORY_CARE_PROVIDER_SITE_OTHER): Payer: Self-pay | Admitting: Medical

## 2017-08-17 VITALS — BP 122/66 | HR 81 | Temp 98.3°F | Resp 16 | Ht 64.0 in | Wt 156.6 lb

## 2017-08-17 DIAGNOSIS — J40 Bronchitis, not specified as acute or chronic: Secondary | ICD-10-CM

## 2017-08-17 DIAGNOSIS — J301 Allergic rhinitis due to pollen: Secondary | ICD-10-CM

## 2017-08-17 DIAGNOSIS — R059 Cough, unspecified: Secondary | ICD-10-CM

## 2017-08-17 DIAGNOSIS — J3489 Other specified disorders of nose and nasal sinuses: Secondary | ICD-10-CM

## 2017-08-17 DIAGNOSIS — R05 Cough: Secondary | ICD-10-CM

## 2017-08-17 MED ORDER — AZITHROMYCIN 250 MG PO TABS: ORAL_TABLET | ORAL | 0 refills | Status: DC

## 2017-08-17 MED ORDER — BENZONATATE 100 MG PO CAPS: 100.0000 mg | ORAL_CAPSULE | Freq: Three times a day (TID) | ORAL | 0 refills | Status: DC | PRN

## 2017-08-17 MED ORDER — FLUTICASONE PROPIONATE 50 MCG/ACT NA SUSP: 2.0000 | Freq: Every day | NASAL | 1 refills | Status: DC

## 2017-08-17 NOTE — Patient Instructions (Addendum)
   You appear to have bronchitis presently preceded by some allergic rhinitis type symptoms.(Also some concern for possible early sinus infection since you report teeth discomfort/itching)   Rest hydrate and tylenol for fever. I am prescribing cough medicine benzonatate, and azithromycin antibiotic. For your nasal congestion Flonase prescription.  You should gradually get better. If not then notify us and would recommend a chest xray.  Follow up in 7-10 days or as needed

## 2017-08-17 NOTE — Progress Notes (Signed)
Subjective:    Patient ID: Jenna [name], female    DOB: April 06, 1991, 26 y.o.   MRN: [id number]  HPI  Pt in with nasal and chest congestion. Pt had yellow nasal drainage. Pt has history of allergies in past but not recently When younger. A lot of sneezing recently. Pt states feels like has mucous to cough up but can't bring it up.  Some sinus pressure. Upper teeth  feel little itchy  Pt tried nyquil but not much symptoms relief.  Pt felt some fever last night. Teeth chattered some last night.  Sick for 2-3 days.  LMP- August 02, 2017.   Review of Systems  Constitutional: Negative for chills, fatigue and fever.  HENT: Positive for congestion, postnasal drip and sinus pressure. Negative for facial swelling, nosebleeds, sinus pain, sore throat and tinnitus.   Respiratory: Positive for cough. Negative for chest tightness, shortness of breath and wheezing.        Feels like has mucous to cough up but not bringing up any just yet.  Cardiovascular: Negative for chest pain and palpitations.  Gastrointestinal: Negative for abdominal pain.  Genitourinary: Negative for dyspareunia, dysuria and flank pain.  Musculoskeletal: Negative for back pain.  Neurological: Negative for dizziness, syncope, weakness, numbness and headaches.  Hematological: Negative for adenopathy. Does not bruise/bleed easily.  Psychiatric/Behavioral: Negative for behavioral problems and confusion.     Past Medical History:  Diagnosis Date  . Chronic diarrhea   . Heart murmur    since childhood  . Rapid palpitations 07/08/2015  . Vasovagal syncope      Social History   Social History  . Marital status: Single    Spouse name: N/A  . Number of children: N/A  . Years of education: N/A   Occupational History  . Not on file.   Social History Main Topics  . Smoking status: Current Every Day Smoker    Packs/day: 1.00    Types: Cigarettes  . Smokeless tobacco: Never Used     Comment: thinking about quitting.    . Alcohol use 0.6 oz/week    1 Standard drinks or equivalent per week     Comment: 2 beers/week  . Drug use: No  . Sexual activity: Yes    Birth control/ protection: None   Other Topics Concern  . Not on file   Social History Narrative   Lives in [place] with her mother, Kalyiah Saintil, and her brother, Lamount Cranker.  Identifies as [place]. Endorses tobacco use as well as rare THC use. Denies ETOH use. + sexually active. Has implanon.     GED   Installs pools       Past Surgical History:  Procedure Laterality Date  . WISDOM TOOTH EXTRACTION      Family History  Problem Relation Age of Onset  . Arrhythmia Maternal Grandmother   . Hypertension Maternal Grandfather   . Diabetes Maternal Grandfather   . Heart attack Maternal Grandfather   . Heart disease Maternal Grandfather   . Heart murmur Mother   . Melanoma Mother        stage 3 malignant melanoma  . Healthy Father   . Healthy Brother   . Scoliosis Brother   . Other Maternal Aunt        TACHYCARDIA  . Diabetes Paternal Grandfather   . Arrhythmia Other        Aunt w/ tachyarrythmia at age 58  . Cancer Other     Allergies  Allergen Reactions  .  Adhesive [Tape] Itching and Rash    Blistering of skin      Current Outpatient Prescriptions on File Prior to Visit  Medication Sig Dispense Refill  . buPROPion (WELLBUTRIN) 100 MG tablet Take 1 tablet (100 mg total) by mouth 2 (two) times daily. 60 tablet 1  . ibuprofen (ADVIL,MOTRIN) 200 MG tablet Take 400 mg by mouth every 6 (six) hours as needed.    . norgestimate-ethinyl estradiol (SPRINTEC 28) 0.25-35 MG-MCG tablet Take 1 tablet by mouth daily. 1 Package 11  . propranolol (INDERAL) 10 MG tablet Take 1 tablet (10 mg total) by mouth 4 (four) times daily as needed (palpitations). 30 tablet 6  . valACYclovir (VALTREX) 1000 MG tablet 2 tabs by mouth now and again in 12 hours. 4 tablet 5   No current facility-administered medications on file prior to visit.      BP 122/66   Pulse 81   Temp 98.3 F (36.8 C) (Oral)   Resp 16   Ht 5\' 4"  (1.626 m)   Wt 156 lb 9.6 oz (71 kg)   LMP 08/02/2017   SpO2 99%   BMI 26.88 kg/m       Objective:   Physical Exam  General  Mental Status - Alert. General Appearance - Well groomed. Not in acute distress.  Skin Rashes- No Rashes.  HEENT Head- Normal. Ear Auditory Canal - Left- Normal. Right - Normal.Tympanic Membrane- Left- Normal. Right- Normal. Eye Sclera/Conjunctiva- Left- Normal. Right- Normal. Nose & Sinuses Nasal Mucosa- Left-  Boggy and Congested. Right-  Boggy and  Congested.Bilateral no maxillary and no  frontal sinus pressure.(but reports some faint teeth pain and itching to teeth) Mouth & Throat Lips: Upper Lip- Normal: no dryness, cracking, pallor, cyanosis, or vesicular eruption. Lower Lip-Normal: no dryness, cracking, pallor, cyanosis or vesicular eruption. Buccal Mucosa- Bilateral- No Aphthous ulcers. Oropharynx- No Discharge or Erythema. +pnd. Tonsils: Characteristics- Bilateral- No Erythema or Congestion. Size/Enlargement- Bilateral- No enlargement. Discharge- bilateral-None.  Neck Neck- Supple. No Masses.   Chest and Lung Exam Auscultation: Breath Sounds:-Clear even and unlabored.  Cardiovascular Auscultation:Rythm- Regular, rate and rhythm. Murmurs & Other Heart Sounds:Ausculatation of the heart reveal- No Murmurs.  Lymphatic Head & Neck General Head & Neck Lymphatics: Bilateral: Description- No Localized lymphadenopathy.       Assessment & Plan:  You appear to have bronchitis presently preceded by some allergic rhinitis type symptoms.(Also some concern for possible early sinus infection since you report teeth discomfort/itching)   Rest hydrate and tylenol for fever. I am prescribing cough medicine benzonatate, and azithromycin antibiotic. For your nasal congestion Flonase prescription.  You should gradually get better. If not then notify us and would  recommend a chest xray.  Follow up in 7-10 days or as needed  Tylee Yum, Percell Miller, [employer]

## 2017-12-18 DIAGNOSIS — N83209 Unspecified ovarian cyst, unspecified side: Secondary | ICD-10-CM

## 2017-12-18 HISTORY — DX: Unspecified ovarian cyst, unspecified side: N83.209

## 2018-01-04 ENCOUNTER — Ambulatory Visit (HOSPITAL_BASED_OUTPATIENT_CLINIC_OR_DEPARTMENT_OTHER)
Admission: RE | Admit: 2018-01-04 | Discharge: 2018-01-04 | Disposition: A | Discharge status: Home / Self Care | Payer: BLUE CROSS/BLUE SHIELD | Source: Ambulatory Visit | Attending: Internal Medicine | Admitting: Internal Medicine

## 2018-01-04 ENCOUNTER — Encounter: Payer: Self-pay | Admitting: Internal Medicine

## 2018-01-04 ENCOUNTER — Ambulatory Visit: Payer: Self-pay | Admitting: *Deleted

## 2018-01-04 ENCOUNTER — Ambulatory Visit: Payer: Self-pay | Admitting: Internal Medicine

## 2018-01-04 VITALS — BP 142/78 | HR 88 | Temp 98.4°F | Resp 16 | Wt 161.0 lb

## 2018-01-04 DIAGNOSIS — R1031 Right lower quadrant pain: Secondary | ICD-10-CM | POA: Diagnosis not present

## 2018-01-04 DIAGNOSIS — R9389 Abnormal findings on diagnostic imaging of other specified body structures: Secondary | ICD-10-CM | POA: Insufficient documentation

## 2018-01-04 DIAGNOSIS — R109 Unspecified abdominal pain: Secondary | ICD-10-CM | POA: Diagnosis not present

## 2018-01-04 DIAGNOSIS — R3129 Other microscopic hematuria: Secondary | ICD-10-CM

## 2018-01-04 LAB — POC URINALSYSI DIPSTICK (AUTOMATED)
Bilirubin, UA: NEGATIVE
Glucose, UA: NEGATIVE
KETONES UA: NEGATIVE
Leukocytes, UA: NEGATIVE
Nitrite, UA: NEGATIVE
PH UA: 7.5 (ref 5.0–8.0)
SPEC GRAV UA: 1.01 (ref 1.010–1.025)
UROBILINOGEN UA: 0.2 U/dL

## 2018-01-04 LAB — TIQ-MISC

## 2018-01-04 LAB — POCT URINE PREGNANCY: Preg Test, Ur: NEGATIVE

## 2018-01-04 MED ORDER — IOPAMIDOL (ISOVUE-300) INJECTION 61%
100.0000 mL | Freq: Once | INTRAVENOUS | Status: AC | PRN
  Administered 2018-01-04: 100 mL via INTRAVENOUS

## 2018-01-04 MED ORDER — SULFAMETHOXAZOLE-TRIMETHOPRIM 800-160 MG PO TABS: 1.0000 | ORAL_TABLET | Freq: Two times a day (BID) | ORAL | 0 refills | Status: DC

## 2018-01-04 NOTE — Progress Notes (Signed)
Subjective:    Patient ID: Jenna [name], female    DOB: 09-29-1991, 27 y.o.   MRN: [id number]  DOS:  01/04/2018 Type of visit - description : acute Interval history: Here with her mother : Sx  Started early this week w/ a RLQ discomfort, initially noticed only w/ the Valsalva maneuver (BMs, or urination), also w/ cough or riding over a bump in the street.  today the pain has been more steady and radiating upward.  Also noted some pain there w/ deep palpation    Review of Systems Denies fever, chill. Appetite is ok; mild nausea but no vomiting. No gross hematuria,  mild terminal dysuria ? np vaginal rash, no vag d/c LMP 12-17-17, good compliance w/  BCP Denies spoting  Past Medical History:  Diagnosis Date  . Chronic diarrhea   . Heart murmur    since childhood  . Rapid palpitations 07/08/2015  . Vasovagal syncope     Past Surgical History:  Procedure Laterality Date  . WISDOM TOOTH EXTRACTION      Social History   Socioeconomic History  . Marital status: Single    Spouse name: Not on file  . Number of children: Not on file  . Years of education: Not on file  . Highest education level: Not on file  Social Needs  . Financial resource strain: Not on file  . Food insecurity - worry: Not on file  . Food insecurity - inability: Not on file  . Transportation needs - medical: Not on file  . Transportation needs - non-medical: Not on file  Occupational History  . Not on file  Tobacco Use  . Smoking status: Current Every Day Smoker    Packs/day: 1.00    Types: Cigarettes  . Smokeless tobacco: Never Used  . Tobacco comment: 7 cigarettes daily  Substance and Sexual Activity  . Alcohol use: Yes    Alcohol/week: 0.6 oz    Types: 1 Standard drinks or equivalent per week    Comment: 2 beers/week  . Drug use: No  . Sexual activity: Yes    Birth control/protection: None  Other Topics Concern  . Not on file  Social History Narrative   Lives in [place] with her mother, Maycel Riffe, and her brother, Lamount Cranker.  Identifies as [place]. Endorses tobacco use as well as rare THC use. Denies ETOH use. + sexually active. Has implanon.     GED   Installs pools      Allergies as of 01/04/2018      Reactions   Adhesive [tape] Itching, Rash   Blistering of skin       Medication List        Accurate as of 01/04/18 11:59 PM. Always use your most recent med list.          buPROPion 100 MG tablet Commonly known as:  WELLBUTRIN Take 1 tablet (100 mg total) by mouth 2 (two) times daily.   ibuprofen 200 MG tablet Commonly known as:  ADVIL,MOTRIN Take 400 mg by mouth every 6 (six) hours as needed.   norgestimate-ethinyl estradiol 0.25-35 MG-MCG tablet Commonly known as:  SPRINTEC 28 Take 1 tablet by mouth daily.   propranolol 10 MG tablet Commonly known as:  INDERAL Take 1 tablet (10 mg total) by mouth 4 (four) times daily as needed (palpitations).   sulfamethoxazole-trimethoprim 800-160 MG tablet Commonly known as:  BACTRIM DS,SEPTRA DS Take 1 tablet by mouth 2 (two) times daily.  valACYclovir 1000 MG tablet Commonly known as:  VALTREX 2 tabs by mouth now and again in 12 hours.          Objective:   Physical Exam BP (!) 142/78 (BP Location: Right Arm, Cuff Size: Normal)   Pulse 88   Temp 98.4 F (36.9 C) (Oral)   Resp 16   Wt 161 lb (73 kg)   LMP 12/17/2017   SpO2 99%   BMI 27.64 kg/m  General:   Well developed, well nourished . NAD.  HEENT:  Normocephalic . Face symmetric, atraumatic Lungs:  CTA B Normal respiratory effort, no intercostal retractions, no accessory muscle use. Heart: RRR,  no murmur.  No pretibial edema bilaterally  Abdomen:  Not distended, soft, tender at the RLQ to palpation specifically at the McBurney point, no rebound . No CVA tenderness  Skin: Exposed areas without rash. Not pale. Not jaundice Neurologic:  alert & oriented X3.  Speech normal, gait appropriate for age and  unassisted Strength symmetric and appropriate for age.  Psych: Cognition and judgment appear intact.  Cooperative with normal attention span and concentration.  Behavior appropriate. No anxious or depressed appearing.     Assessment & Plan:   27 year old female with history of anxiety,  SVT bb prn, on BCP presents with  RLQ abd pain: Sx started few ~ 4-5 days ago on the RLQ, Udip is + for blood , UPT (-). Sx are not completely typical for a kidney stone, exam is worrisome for appendicitis  DDX includes appendicitis, UTI, ureteral stone, doubt PID, ?ovarian cyst. Plan: Stat CT abd-pelvis with; send UA UCX If no stone or appendicitis , she will start bactrim DS empirically , paper Rx provided  Will send a message to Barber MD on call as results will be ready late tonight  ER if severe sx or if for whatever reason she can not get the CT

## 2018-01-04 NOTE — Telephone Encounter (Signed)
Please offer the patient a earlier appointment for this afternoon, she might need to do a CAT scan etc.

## 2018-01-04 NOTE — Telephone Encounter (Signed)
FYI. Pt has appt at 4PM with you today.

## 2018-01-04 NOTE — Patient Instructions (Signed)
Go downstairs , you need a Cat scan tonight  Drink fluids  Go to the ER for a evaluation if: You get worse, you have high fever chills or if for whatever reason you can't get the Cat scan done   Only take the antibiotic if directed by the doctor on call

## 2018-01-04 NOTE — Telephone Encounter (Signed)
Patient is calling with onset of RLQ pain that started this week and has not gone away. She is becoming concerned that it is not going away- she states her pain is internal when she applies pressure to that area- bending -sitting- BM. She has also started slight urinary symptoms.  Reason for Disposition . [1] MODERATE pain (e.g., interferes with normal activities) AND [2] pain comes and goes (cramps) AND [3] present > 24 hours  (Exception: pain with Vomiting or Diarrhea - see that Guideline)  Answer Assessment - Initial Assessment Questions 1. LOCATION: "Where does it hurt?"      RLQ 2. RADIATION: "Does the pain shoot anywhere else?" (e.g., chest, back)     No- only when sitting or applying pressure to the area 3. ONSET: "When did the pain begin?" (e.g., minutes, hours or days ago)      3-4 days ago 4. SUDDEN: "Gradual or sudden onset?"     gradual 5. PATTERN "Does the pain come and go, or is it constant?"    - If constant: "Is it getting better, staying the same, or worsening?"      (Note: Constant means the pain never goes away completely; most serious pain is constant and it progresses)     - If intermittent: "How long does it last?" "Do you have pain now?"     (Note: Intermittent means the pain goes away completely between bouts)     Intermittent pain when she moves and applys pressure to area 6. SEVERITY: "How bad is the pain?"  (e.g., Scale 1-10; mild, moderate, or severe)   - MILD (1-3): doesn't interfere with normal activities, abdomen soft and not tender to touch    - MODERATE (4-7): interferes with normal activities or awakens from sleep, tender to touch    - SEVERE (8-10): excruciating pain, doubled over, unable to do any normal activities      Mild- internal pain- not tender to external touch 7. RECURRENT SYMPTOM: "Have you ever had this type of abdominal pain before?" If so, ask: "When was the last time?" and "What happened that time?"      Similar to gas pain or ovulation  pain 8. CAUSE: "What do you think is causing the abdominal pain?"     ? Unknown cause 9. RELIEVING/AGGRAVATING FACTORS: "What makes it better or worse?" (e.g., movement, antacids, bowel movement)     No relief- it is sore- when having BM- patient can't relieve until finished 10. OTHER SYMPTOMS: "Has there been any vomiting, diarrhea, constipation, or urine problems?"       Last night started slight pain end of stream, since started Wellbutrin- she has only 1 BM daily 11. PREGNANCY: "Is there any chance you are pregnant?" "When was your last menstrual period?"       LMP- 12/19/17 OCP  Protocols used: ABDOMINAL PAIN - [facility]

## 2018-01-04 NOTE — Telephone Encounter (Signed)
Spoke w/ Pt, offered her to come in earlier- she is at work right now and can't leave early. She will be here for appt at 4.

## 2018-01-04 NOTE — Progress Notes (Signed)
Pre visit review using our clinic review tool, if applicable. No additional management support is needed unless otherwise documented below in the visit note. 

## 2018-01-06 ENCOUNTER — Telehealth: Payer: Self-pay | Admitting: Internal Medicine

## 2018-01-06 DIAGNOSIS — N83201 Unspecified ovarian cyst, right side: Secondary | ICD-10-CM

## 2018-01-06 NOTE — Telephone Encounter (Signed)
Please call the patient, is she doing better?  Let me know. Also needs to see gynecology hopefully within the next few days.  Please enter a referral.

## 2018-01-07 LAB — URINE CULTURE
MICRO NUMBER:: 90078507
SPECIMEN QUALITY:: ADEQUATE

## 2018-01-07 NOTE — Telephone Encounter (Signed)
LMOM instructing to call back to let us know how she is doing. Also to let us know if she is already established w/ GYN or if she has a preference of one.

## 2018-01-08 NOTE — Telephone Encounter (Signed)
Pt doing better- GYN referral placed to Dr Dellis Filbert.

## 2018-01-08 NOTE — Telephone Encounter (Signed)
Pt  Called  Returning  Voicemail      She  Reports  She  Is  Doing a  Lot  Better   Not having   Any  Pain .    Pt   Stated  She  Has  No Gyn    Provider    And  Does not have  A  Preference  For  One.

## 2018-01-08 NOTE — Telephone Encounter (Signed)
Great, thx!

## 2018-01-22 ENCOUNTER — Ambulatory Visit: Payer: BLUE CROSS/BLUE SHIELD | Admitting: Women's Health

## 2018-01-22 ENCOUNTER — Encounter: Payer: Self-pay | Admitting: Women's Health

## 2018-01-22 VITALS — BP 126/84 | Ht 65.4 in | Wt 157.8 lb

## 2018-01-22 DIAGNOSIS — N83201 Unspecified ovarian cyst, right side: Secondary | ICD-10-CM

## 2018-01-22 NOTE — Progress Notes (Signed)
Jenna [name] 1991-07-16 [id number]    History:    Presents for new patient problem visit. Had excruciating pain January 18 primary care sent for a CT of abdomen which revealed a right ovarian 4.5 cm cyst. Pain-free today, questions what is needed for follow-up. Normal Pap 07/2017. Same partner for years denies need for STD screen. Gardasil series completed. Monthly cycle on Sprintec with no spotting. Reports mother has history of ovarian cysts. History of anxiety and depression stable, currently on Wellbutrin for smoking cessation.  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 Mother history of melanoma.  ROS:  A ROS was performed and pertinent positives and negatives are included.  Exam:  Vitals:   01/22/18 1532  BP: 126/84  Weight: 157 lb 12.8 oz (71.6 kg)  Height: 5' 5.4" (1.661 m)   Body mass index is 25.94 kg/m.   General appearance:  Normal Thyroid:  Symmetrical, normal in size, without palpable masses or nodularity. Respiratory  Auscultation:  Clear without wheezing or rhonchi Cardiovascular  Auscultation:  Regular rate, without rubs, murmurs or gallops  Edema/varicosities:  Not grossly evident Abdominal  Soft,nontender, without masses, guarding or rebound.  Liver/spleen:  No organomegaly noted  Hernia:  None appreciated  Skin  Inspection:  Grossly normal   Breasts: Examined lying and sitting.     Right: Without masses, retractions, discharge or axillary adenopathy.     Left: Without masses, retractions, discharge or axillary adenopathy. Gentitourinary   Inguinal/mons:  Normal without inguinal adenopathy  External genitalia:  Normal   Assessment/Plan:  27 y.o. S WF G0 for follow-up ovarian cyst found on abdominal/ pelvic CT 12/2017.  Monthly cycle on Sprintec Smoker  Plan: Return to office in 2 months to check for resolution of right ovarian cyst. CT  report reviewed with patient. Continue OCs. Annual exam due in August. Will watch at this time, currently pain-free. Tips were quitting smoking reviewed, aware of hazards and dangers of smoking   [facility], 4:09 PM 01/22/2018

## 2018-01-22 NOTE — Patient Instructions (Signed)
Ovarian Cyst An ovarian cyst is a fluid-filled sac that forms on an ovary. The ovaries are small organs that produce eggs in women. Various types of cysts can form on the ovaries. Some may cause symptoms and require treatment. Most ovarian cysts go away on their own, are not cancerous (are benign), and do not cause problems. Common types of ovarian cysts include:  Functional (follicle) cysts. ? Occur during the menstrual cycle, and usually go away with the next menstrual cycle if you do not get pregnant. ? Usually cause no symptoms.  Endometriomas. ? Are cysts that form from the tissue that lines the uterus (endometrium). ? Are sometimes called "chocolate cysts" because they become filled with blood that turns brown. ? Can cause pain in the lower abdomen during intercourse and during your period.  Cystadenoma cysts. ? Develop from cells on the outside surface of the ovary. ? Can get very large and cause lower abdomen pain and pain with intercourse. ? Can cause severe pain if they twist or break open (rupture).  Dermoid cysts. ? Are sometimes found in both ovaries. ? May contain different kinds of body tissue, such as skin, teeth, hair, or cartilage. ? Usually do not cause symptoms unless they get very big.  Theca lutein cysts. ? Occur when too much of a certain hormone (human chorionic gonadotropin) is produced and overstimulates the ovaries to produce an egg. ? Are most common after having procedures used to assist with the conception of a baby (in vitro fertilization).  What are the causes? Ovarian cysts may be caused by:  Ovarian hyperstimulation syndrome. This is a condition that can develop from taking fertility medicines. It causes multiple large ovarian cysts to form.  Polycystic ovarian syndrome (PCOS). This is a common hormonal disorder that can cause ovarian cysts, as well as problems with your period or fertility.  What increases the risk? The following factors may make  you more likely to develop ovarian cysts:  Being overweight or obese.  Taking fertility medicines.  Taking certain forms of hormonal birth control.  Smoking.  What are the signs or symptoms? Many ovarian cysts do not cause symptoms. If symptoms are present, they may include:  Pelvic pain or pressure.  Pain in the lower abdomen.  Pain during sex.  Abdominal swelling.  Abnormal menstrual periods.  Increasing pain with menstrual periods.  How is this diagnosed? These cysts are commonly found during a routine pelvic exam. You may have tests to find out more about the cyst, such as:  Ultrasound.  X-ray of the pelvis.  CT scan.  MRI.  Blood tests.  How is this treated? Many ovarian cysts go away on their own without treatment. Your health care provider may want to check your cyst regularly for 2-3 months to see if it changes. If you are in menopause, it is especially important to have your cyst monitored closely because menopausal women have a higher rate of ovarian cancer. When treatment is needed, it may include:  Medicines to help relieve pain.  A procedure to drain the cyst (aspiration).  Surgery to remove the whole cyst.  Hormone treatment or birth control pills. These methods are sometimes used to help dissolve a cyst.  Follow these instructions at home:  Take over-the-counter and prescription medicines only as told by your health care provider.  Do not drive or use heavy machinery while taking prescription pain medicine.  Get regular pelvic exams and Pap tests as often as told by your health care   provider.  Return to your normal activities as told by your health care provider. Ask your health care provider what activities are safe for you.  Do not use any products that contain nicotine or tobacco, such as cigarettes and e-cigarettes. If you need help quitting, ask your health care provider.  Keep all follow-up visits as told by your health care provider.  This is important. Contact a health care provider if:  Your periods are late, irregular, or painful, or they stop.  You have pelvic pain that does not go away.  You have pressure on your bladder or trouble emptying your bladder completely.  You have pain during sex.  You have any of the following in your abdomen: ? A feeling of fullness. ? Pressure. ? Discomfort. ? Pain that does not go away. ? Swelling.  You feel generally ill.  You become constipated.  You lose your appetite.  You develop severe acne.  You start to have more body hair and facial hair.  You are gaining weight or losing weight without changing your exercise and eating habits.  You think you may be pregnant. Get help right away if:  You have abdominal pain that is severe or gets worse.  You cannot eat or drink without vomiting.  You suddenly develop a fever.  Your menstrual period is much heavier than usual. This information is not intended to replace advice given to you by your health care provider. Make sure you discuss any questions you have with your health care provider. Document Released: 12/04/2005 Document Revised: 06/23/2016 Document Reviewed: 05/07/2016 Elsevier Interactive Patient Education  2018 Elsevier Inc.  

## 2018-03-25 ENCOUNTER — Other Ambulatory Visit: Payer: BLUE CROSS/BLUE SHIELD

## 2018-03-25 ENCOUNTER — Ambulatory Visit: Payer: BLUE CROSS/BLUE SHIELD | Admitting: Women's Health

## 2018-03-25 DIAGNOSIS — Z0289 Encounter for other administrative examinations: Secondary | ICD-10-CM

## 2018-04-10 ENCOUNTER — Other Ambulatory Visit: Payer: Self-pay | Admitting: Family

## 2018-07-05 ENCOUNTER — Other Ambulatory Visit: Payer: Self-pay | Admitting: Family

## 2018-08-30 ENCOUNTER — Telehealth: Payer: Self-pay | Admitting: Family

## 2018-08-30 NOTE — Telephone Encounter (Signed)
Jenna [name] -- pt last seen by you 07/2017 and acute visit with [facility] 12/2017.  Please advise refill request and follow up?

## 2018-09-01 NOTE — Telephone Encounter (Signed)
Refill sent. Let's schedule her for cpx please.

## 2018-09-03 NOTE — Telephone Encounter (Signed)
Called pt and LVM informing her that her refill was sent, however, she is due for a CPE. Advised pt to call back and schedule her CPE at her convenience.

## 2018-09-16 ENCOUNTER — Encounter: Payer: Self-pay | Admitting: Family

## 2018-09-16 ENCOUNTER — Ambulatory Visit (INDEPENDENT_AMBULATORY_CARE_PROVIDER_SITE_OTHER): Payer: Self-pay | Admitting: Family

## 2018-09-16 VITALS — BP 122/70 | HR 75 | Temp 99.3°F | Resp 16 | Ht 65.4 in | Wt 173.0 lb

## 2018-09-16 DIAGNOSIS — L0291 Cutaneous abscess, unspecified: Secondary | ICD-10-CM

## 2018-09-16 MED ORDER — DOXYCYCLINE HYCLATE 100 MG PO TABS: 100.0000 mg | ORAL_TABLET | Freq: Two times a day (BID) | ORAL | 0 refills | Status: DC

## 2018-09-16 NOTE — Progress Notes (Signed)
Subjective:    Patient ID: Jenna [name], female    DOB: [birth year], 27 y.o.   MRN: [id number]  HPI  Jenna [name] is a 27 yr old female who presents today do discuss an abscess on the left labia.  Started last Thursday. Has been soaking/compresses. Drained some yesterday.  Area is painful. Had a similar abscess in this area back in 2017.  Review of Systems See HPI  Past Medical History:  Diagnosis Date  . Chronic diarrhea   . Cyst of ovary 12/2017  . Heart murmur    since childhood  . Rapid palpitations 07/08/2015  . Vasovagal syncope      Social History   Socioeconomic History  . Marital status: Single    Spouse name: Not on file  . Number of children: Not on file  . Years of education: Not on file  . Highest education level: Not on file  Occupational History  . Not on file  Social Needs  . Financial resource strain: Not on file  . Food insecurity:    Worry: Not on file    Inability: Not on file  . Transportation needs:    Medical: Not on file    Non-medical: Not on file  Tobacco Use  . Smoking status: Current Every Day Smoker    Packs/day: 1.00    Types: Cigarettes  . Smokeless tobacco: Never Used  . Tobacco comment: 7 cigarettes daily  Substance and Sexual Activity  . Alcohol use: Yes    Alcohol/week: 1.0 standard drinks    Types: 1 Standard drinks or equivalent per week    Comment: 2 beers/week  . Drug use: No  . Sexual activity: Yes    Partners: Male    Birth control/protection: Pill    Comment: Patient declined to answer sexual history.  Lifestyle  . Physical activity:    Days per week: Not on file    Minutes per session: Not on file  . Stress: Not on file  Relationships  . Social connections:    Talks on phone: Not on file    Gets together: Not on file    Attends religious service: Not on file    Active member of club or organization: Not on file    Attends meetings of clubs or organizations: Not on file    Relationship status: Not on file  .  Intimate partner violence:    Fear of current or ex partner: Not on file    Emotionally abused: Not on file    Physically abused: Not on file    Forced sexual activity: Not on file  Other Topics Concern  . Not on file  Social History Narrative   Lives in [place] with her mother, Jenna [name], and her brother, Jenna [name].  Identifies as [place]. Endorses tobacco use as well as rare THC use. Denies ETOH use. + sexually active. Has implanon.     GED   Installs pools    Past Surgical History:  Procedure Laterality Date  . WISDOM TOOTH EXTRACTION      Family History  Problem Relation Age of Onset  . Arrhythmia Maternal Grandmother   . Hypertension Maternal Grandfather   . Diabetes Maternal Grandfather   . Heart attack Maternal Grandfather   . Heart disease Maternal Grandfather   . Heart murmur Mother   . Melanoma Mother        stage 3 malignant melanoma  . Healthy Father   . Healthy Brother   .  Scoliosis Brother   . Other Maternal Aunt        TACHYCARDIA  . Diabetes Paternal Grandfather   . Arrhythmia Other        Aunt w/ tachyarrythmia at age 46  . Cancer Other     Allergies  Allergen Reactions  . Adhesive [Tape] Itching and Rash    Blistering of skin      Current Outpatient Medications on File Prior to Visit  Medication Sig Dispense Refill  . buPROPion (WELLBUTRIN) 100 MG tablet Take 1 tablet (100 mg total) by mouth 2 (two) times daily. 60 tablet 1  . ibuprofen (ADVIL,MOTRIN) 200 MG tablet Take 400 mg by mouth every 6 (six) hours as needed.    . propranolol (INDERAL) 10 MG tablet Take 1 tablet (10 mg total) by mouth 4 (four) times daily as needed (palpitations). 30 tablet 6  . SPRINTEC 28 0.25-35 MG-MCG tablet TAKE 1 TABLET BY MOUTH ONCE DAILY 28 tablet 1  . valACYclovir (VALTREX) 1000 MG tablet TAKE 2 TABLETS BY MOUTH NOW AND  AGAIN  IN  12  HOURS 4 tablet 5   No current facility-administered medications on file prior to visit.     BP 122/70 (BP  Location: Right Arm, Patient Position: Sitting, Cuff Size: Small)   Pulse 75   Temp 99.3 F (37.4 C) (Oral)   Resp 16   Ht 5' 5.4" (1.661 m)   Wt 173 lb (78.5 kg)   LMP 08/27/2018 (Approximate)   SpO2 99%   BMI 28.44 kg/m       Objective:   Physical Exam  Constitutional: She is oriented to person, place, and time. She appears well-developed and well-nourished. No distress.  Neurological: She is alert and oriented to person, place, and time.  Skin: Skin is warm and dry.  + abscess left upper labia with surrounding erythema/induration.           Assessment & Plan:  Abscess- Procedure including risks/benefits explained to patient.  Questions were answered. After informed consent was obtained site was cleansed with betadine and then alcohol. 1% Lidocaine was infiltrated into abscess/surrounding area. Incision was performed with bloody drainage.  Internal septations were broken up with scalpel.   Pt tolerated procedure well.  Pt is advised to continue sitz baths/compresses bid and to begin doxycycline. She is advised to call if fever, increased pain/swelling/redness.  Follow up in 2 days.

## 2018-09-16 NOTE — Patient Instructions (Signed)
Start doxycycline. Please call if increased pain/swelling/fever or if your symptoms do not improve.

## 2018-09-18 ENCOUNTER — Encounter: Payer: Self-pay | Admitting: Family

## 2018-09-18 ENCOUNTER — Ambulatory Visit (INDEPENDENT_AMBULATORY_CARE_PROVIDER_SITE_OTHER): Payer: Self-pay | Admitting: Family

## 2018-09-18 VITALS — BP 129/68 | HR 69 | Temp 98.7°F | Resp 16 | Ht 64.0 in | Wt 173.0 lb

## 2018-09-18 DIAGNOSIS — N764 Abscess of vulva: Secondary | ICD-10-CM

## 2018-09-18 NOTE — Progress Notes (Signed)
Subjective:    Patient ID: Jenna [name], female    DOB: 1991/08/31, 27 y.o.   MRN: [id number]  HPI  Patient is a 27 yr old female who presents today for follow up of her vulvar abscess. We performed I and D on 9/30 and initiated doxycycline. She reports significant improvement since her I and D.   Review of Systems    see HPI  Past Medical History:  Diagnosis Date  . Chronic diarrhea   . Cyst of ovary 12/2017  . Heart murmur    since childhood  . Rapid palpitations 07/08/2015  . Vasovagal syncope      Social History   Socioeconomic History  . Marital status: Single    Spouse name: Not on file  . Number of children: Not on file  . Years of education: Not on file  . Highest education level: Not on file  Occupational History  . Not on file  Social Needs  . Financial resource strain: Not on file  . Food insecurity:    Worry: Not on file    Inability: Not on file  . Transportation needs:    Medical: Not on file    Non-medical: Not on file  Tobacco Use  . Smoking status: [name] Every Day Smoker    Packs/day: 1.00    Types: Cigarettes  . Smokeless tobacco: Never Used  . Tobacco comment: 7 cigarettes daily  Substance and Sexual Activity  . Alcohol use: Yes    Alcohol/week: 1.0 standard drinks    Types: 1 Standard drinks or equivalent per week    Comment: 2 beers/week  . Drug use: No  . Sexual activity: Yes    Partners: Male    Birth control/protection: Pill    Comment: Patient declined to answer sexual history.  Lifestyle  . Physical activity:    Days per week: Not on file    Minutes per session: Not on file  . Stress: Not on file  Relationships  . Social connections:    Talks on phone: Not on file    Gets together: Not on file    Attends religious service: Not on file    Active member of club or organization: Not on file    Attends meetings of clubs or organizations: Not on file    Relationship status: Not on file  . Intimate partner violence:    Fear  of [name] or ex partner: Not on file    Emotionally abused: Not on file    Physically abused: Not on file    Forced sexual activity: Not on file  Other Topics Concern  . Not on file  Social History Narrative   Lives in [place] with her mother, Jenna [name], and her brother, Jenna [name].  Identifies as [place]. Endorses tobacco use as well as rare THC use. Denies ETOH use. + sexually active. Has implanon.     GED   Installs pools    Past Surgical History:  Procedure Laterality Date  . WISDOM TOOTH EXTRACTION      Family History  Problem Relation Age of Onset  . Arrhythmia Maternal Grandmother   . Hypertension Maternal Grandfather   . Diabetes Maternal Grandfather   . Heart attack Maternal Grandfather   . Heart disease Maternal Grandfather   . Heart murmur Mother   . Melanoma Mother        stage 3 malignant melanoma  . Healthy Father   . Healthy Brother   .  Scoliosis Brother   . Other Maternal Aunt        TACHYCARDIA  . Diabetes Paternal Grandfather   . Arrhythmia Other        Aunt w/ tachyarrythmia at age 72  . Cancer Other     Allergies  Allergen Reactions  . Adhesive [Tape] Itching and Rash    Blistering of skin      [name] Outpatient Medications on File Prior to Visit  Medication Sig Dispense Refill  . buPROPion (WELLBUTRIN) 100 MG tablet Take 1 tablet (100 mg total) by mouth 2 (two) times daily. 60 tablet 1  . doxycycline (VIBRA-TABS) 100 MG tablet Take 1 tablet (100 mg total) by mouth 2 (two) times daily. 20 tablet 0  . ibuprofen (ADVIL,MOTRIN) 200 MG tablet Take 400 mg by mouth every 6 (six) hours as needed.    . propranolol (INDERAL) 10 MG tablet Take 1 tablet (10 mg total) by mouth 4 (four) times daily as needed (palpitations). 30 tablet 6  . SPRINTEC 28 0.25-35 MG-MCG tablet TAKE 1 TABLET BY MOUTH ONCE DAILY 28 tablet 1  . valACYclovir (VALTREX) 1000 MG tablet TAKE 2 TABLETS BY MOUTH NOW AND  AGAIN  IN  12  HOURS 4 tablet 5   No [name]  facility-administered medications on file prior to visit.     BP 129/68 (BP Location: Left Arm, Patient Position: Sitting, Cuff Size: Small)   Pulse 69   Temp 98.7 F (37.1 C) (Oral)   Resp 16   Ht 5\' 4"  (1.626 m)   Wt 173 lb (78.5 kg)   LMP 08/27/2018 (Approximate)   SpO2 100%   BMI 29.70 kg/m    Objective:   Physical Exam  Constitutional: She appears well-developed and well-nourished. No distress.  Genitourinary:     Genitourinary Comments: Abscess left upper vulva is significantly small with near resolution of surrounding induration          Assessment & Plan:  Vulvar abscess- improving.  Advised pt as follows:    Continue doxycycline. If healed you may discontinue after 7 days. Call if symptoms worsen or if they don't continue to improve.

## 2018-09-18 NOTE — Patient Instructions (Signed)
Continue doxycycline. If healed you may discontinue after 7 days. Call if symptoms worsen or if they don't continue to improve.

## 2018-09-30 ENCOUNTER — Encounter: Payer: Self-pay | Admitting: Family

## 2018-09-30 MED ORDER — NORGESTIMATE-ETH ESTRADIOL 0.25-35 MG-MCG PO TABS: 1.0000 | ORAL_TABLET | Freq: Every day | ORAL | 11 refills | Status: DC

## 2018-10-11 ENCOUNTER — Ambulatory Visit (INDEPENDENT_AMBULATORY_CARE_PROVIDER_SITE_OTHER): Payer: Self-pay | Admitting: Family

## 2018-10-11 ENCOUNTER — Encounter: Payer: Self-pay | Admitting: Family

## 2018-10-11 VITALS — BP 118/74 | HR 67 | Temp 98.7°F | Resp 16 | Ht 64.0 in | Wt 171.0 lb

## 2018-10-11 DIAGNOSIS — R519 Headache, unspecified: Secondary | ICD-10-CM

## 2018-10-11 DIAGNOSIS — N764 Abscess of vulva: Secondary | ICD-10-CM

## 2018-10-11 DIAGNOSIS — Z0000 Encounter for general adult medical examination without abnormal findings: Secondary | ICD-10-CM

## 2018-10-11 DIAGNOSIS — E010 Iodine-deficiency related diffuse (endemic) goiter: Secondary | ICD-10-CM

## 2018-10-11 DIAGNOSIS — R51 Headache: Secondary | ICD-10-CM

## 2018-10-11 DIAGNOSIS — Z87898 Personal history of other specified conditions: Secondary | ICD-10-CM

## 2018-10-11 DIAGNOSIS — Z20820 Contact with and (suspected) exposure to varicella: Secondary | ICD-10-CM

## 2018-10-11 DIAGNOSIS — Z23 Encounter for immunization: Secondary | ICD-10-CM

## 2018-10-11 NOTE — Progress Notes (Signed)
Subjective:    Patient ID: Jenna [name], female    DOB: 1991/05/12, 27 y.o.   MRN: [id number]  HPI  Patient presents today for complete physical.  Immunizations: would like flu shot today, tetanus 2018 Diet: healthy Exercise: was running daily Wt Readings from Last 3 Encounters:  10/11/18 171 lb (77.6 kg)  09/18/18 173 lb (78.5 kg)  09/16/18 173 lb (78.5 kg)  Pap: 07/27/17  Dental: due  Vision: due  Reports that she has had a HA for 3 weeks straight.  HA is frontal.  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   Monday/Tuesday/Wednesday- feels like she hasn't slept, light headed a little sluggish, dry mouth, polyuria, popping form her right ear.    Review of Systems  Constitutional: Negative for unexpected weight change.  HENT: Negative for hearing loss and rhinorrhea.   Eyes:       Wears reading glasses  Respiratory: Negative for cough.   Cardiovascular:       Feels like she retains a lot of fluid.  Gastrointestinal: Negative for constipation and diarrhea.  Genitourinary: Positive for frequency. Negative for dysuria and hematuria.  Musculoskeletal: Negative for arthralgias.  Neurological: Positive for headaches.  Hematological: Negative for adenopathy.  Psychiatric/Behavioral:       Denies depression/anxiety       Past Medical History:  Diagnosis Date  . Chronic diarrhea   . Cyst of ovary 12/2017  . Heart murmur    since childhood  . Rapid palpitations 07/08/2015  . Vasovagal syncope      Social History   Socioeconomic History  . Marital status: Single    Spouse name: Not on file  . Number of children: Not on file  . Years of education: Not on file  . Highest education level: Not on file  Occupational History  . Not on file  Social Needs  . Financial resource strain: Not on file  . Food insecurity:    Worry: Not on file    Inability: Not on file  . Transportation needs:    Medical: Not on file   Non-medical: Not on file  Tobacco Use  . Smoking status: Current Every Day Smoker    Packs/day: 1.00    Types: Cigarettes  . Smokeless tobacco: Never Used  . Tobacco comment: 7 cigarettes daily  Substance and Sexual Activity  . Alcohol use: Yes    Alcohol/week: 1.0 standard drinks    Types: 1 Standard drinks or equivalent per week    Comment: 2 beers/week  . Drug use: No  . Sexual activity: Yes    Partners: Male    Birth control/protection: Pill    Comment: Patient declined to answer sexual history.  Lifestyle  . Physical activity:    Days per week: Not on file    Minutes per session: Not on file  . Stress: Not on file  Relationships  . Social connections:    Talks on phone: Not on file    Gets together: Not on file    Attends religious service: Not on file    Active member of club or organization: Not on file    Attends meetings of clubs or organizations: Not on file    Relationship status: Not on file  . Intimate partner violence:    Fear of current or ex partner: Not on file    Emotionally abused: Not on file    Physically abused: Not on file  Forced sexual activity: Not on file  Other Topics Concern  . Not on file  Social History Narrative   Lives in [place] with her mother, Aira Sallade, and her brother, Lamount Cranker.  Identifies as [place]. Endorses tobacco use as well as rare THC use. Denies ETOH use. + sexually active. Has implanon.     GED   Installs pools    Past Surgical History:  Procedure Laterality Date  . WISDOM TOOTH EXTRACTION      Family History  Problem Relation Age of Onset  . Arrhythmia Maternal Grandmother   . Hypertension Maternal Grandfather   . Diabetes Maternal Grandfather   . Heart attack Maternal Grandfather   . Heart disease Maternal Grandfather   . Heart murmur Mother   . Melanoma Mother        stage 3 malignant melanoma  . Healthy Father   . Healthy Brother   . Scoliosis Brother   . Other Maternal Aunt         TACHYCARDIA  . Diabetes Paternal Grandfather   . Arrhythmia Other        Aunt w/ tachyarrythmia at age 38  . Cancer Other     Allergies  Allergen Reactions  . Adhesive [Tape] Itching and Rash    Blistering of skin      Current Outpatient Medications on File Prior to Visit  Medication Sig Dispense Refill  . ibuprofen (ADVIL,MOTRIN) 200 MG tablet Take 400 mg by mouth every 6 (six) hours as needed.    . norgestimate-ethinyl estradiol (SPRINTEC 28) 0.25-35 MG-MCG tablet Take 1 tablet by mouth daily. 28 tablet 11  . propranolol (INDERAL) 10 MG tablet Take 1 tablet (10 mg total) by mouth 4 (four) times daily as needed (palpitations). 30 tablet 6  . buPROPion (WELLBUTRIN) 100 MG tablet Take 1 tablet (100 mg total) by mouth 2 (two) times daily. (Patient not taking: Reported on 10/11/2018) 60 tablet 1   No current facility-administered medications on file prior to visit.     BP 118/74 (BP Location: Right Arm, Patient Position: Sitting, Cuff Size: Small)   Pulse 67   Temp 98.7 F (37.1 C) (Oral)   Resp 16   Ht 5\' 4"  (1.626 m)   Wt 171 lb (77.6 kg)   LMP 09/20/2018 (Approximate)   SpO2 98%   BMI 29.35 kg/m    Objective:   Physical Exam  Physical Exam  Constitutional: She is oriented to person, place, and time. She appears well-developed and well-nourished. No distress.  HENT:  Head: Normocephalic and atraumatic.  Right Ear: Tympanic membrane and ear canal normal.  Left Ear: Tympanic membrane and ear canal normal.  Mouth/Throat: Oropharynx is clear and moist.  Eyes: Pupils are equal, round, and reactive to light. No scleral icterus.  Neck: Normal range of motion. + Thyromegaly noted R lobe>L lobe Cardiovascular: Normal rate and regular rhythm.   No murmur heard. Pulmonary/Chest: Effort normal and breath sounds normal. No respiratory distress. He has no wheezes. She has no rales. She exhibits no tenderness.  Abdominal: Soft. Bowel sounds are normal. She exhibits no distension  and no mass. There is no tenderness. There is no rebound and no guarding.  Musculoskeletal: She exhibits no edema.  Lymphadenopathy:    She has no cervical adenopathy.  Neurological: She is alert and oriented to person, place, and time. She has normal patellar reflexes. She exhibits normal muscle tone. Coordination normal.  Skin: Skin is warm and dry.  Psychiatric: She has  a normal mood and affect. Her behavior is normal. Judgment and thought content normal.  Breasts: Examined lying Right: Without masses, retractions, discharge or axillary adenopathy.  Left: Without masses, retractions, discharge or axillary adenopathy.  Pelvic: deferred           Assessment & Plan:  Preventative care- reviewed [facility], pt never received hep A #2. Will reinitiate series. Only received one varicella- will give booster today. Would like flu shot. Would like gardisil #2 (only had one injection).   Thyromegaly- obtain US and TSH to further evaluate.  Hx of palpitations- has seen cardiology in the past and would like to be referred to cardiologist in our building for follow up.  Hx of vulvar abscess- resolved. Will refer to general surgeon for evaluation due to recurrent nature of this abscess.  Headache- ? Allergies, ?Tension. Trial of claritin, follow up if symptoms worsen or if they fail to improve.         Assessment & Plan:

## 2018-10-11 NOTE — Patient Instructions (Addendum)
Please add claritin once daily to see if helps with your headache.  You should be contacted about your appointment to re-establish with a cardiologist here in [facility]. You should be contacted about your referral to the surgeon to discuss recurrent abscess. You should be contacted about scheduling your thyroid ultrasound on the first floor. Complete lab work prior to leaving.

## 2018-10-12 ENCOUNTER — Encounter: Payer: Self-pay | Admitting: Family

## 2018-10-12 ENCOUNTER — Other Ambulatory Visit: Payer: Self-pay | Admitting: Family

## 2018-10-12 LAB — URINALYSIS, ROUTINE W REFLEX MICROSCOPIC
BACTERIA UA: NONE SEEN /HPF
Bilirubin Urine: NEGATIVE
GLUCOSE, UA: NEGATIVE
Hgb urine dipstick: NEGATIVE
Hyaline Cast: NONE SEEN /LPF
KETONES UR: NEGATIVE
Nitrite: NEGATIVE
Protein, ur: NEGATIVE
RBC / HPF: NONE SEEN /HPF (ref 0–2)
SPECIFIC GRAVITY, URINE: 1.024 (ref 1.001–1.03)
pH: 6 (ref 5.0–8.0)

## 2018-10-12 LAB — CBC WITH DIFFERENTIAL/PLATELET
BASOS ABS: 32 {cells}/uL (ref 0–200)
Basophils Relative: 0.4 %
EOS ABS: 88 {cells}/uL (ref 15–500)
Eosinophils Relative: 1.1 %
HCT: 38.9 % (ref 35.0–45.0)
Hemoglobin: 13.1 g/dL (ref 11.7–15.5)
Lymphs Abs: 2504 cells/uL (ref 850–3900)
MCH: 28.8 pg (ref 27.0–33.0)
MCHC: 33.7 g/dL (ref 32.0–36.0)
MCV: 85.5 fL (ref 80.0–100.0)
MONOS PCT: 5.7 %
MPV: 10.6 fL (ref 7.5–12.5)
Neutro Abs: 4920 cells/uL (ref 1500–7800)
Neutrophils Relative %: 61.5 %
Platelets: 235 10*3/uL (ref 140–400)
RBC: 4.55 10*6/uL (ref 3.80–5.10)
RDW: 11.7 % (ref 11.0–15.0)
TOTAL LYMPHOCYTE: 31.3 %
WBC mixed population: 456 cells/uL (ref 200–950)
WBC: 8 10*3/uL (ref 3.8–10.8)

## 2018-10-12 LAB — HEPATIC FUNCTION PANEL
AG RATIO: 1.8 (calc) (ref 1.0–2.5)
ALKALINE PHOSPHATASE (APISO): 47 U/L (ref 33–115)
ALT: 19 U/L (ref 6–29)
AST: 16 U/L (ref 10–30)
Albumin: 4.6 g/dL (ref 3.6–5.1)
BILIRUBIN DIRECT: 0.1 mg/dL (ref 0.0–0.2)
BILIRUBIN INDIRECT: 0.2 mg/dL (ref 0.2–1.2)
GLOBULIN: 2.5 g/dL (ref 1.9–3.7)
Total Bilirubin: 0.3 mg/dL (ref 0.2–1.2)
Total Protein: 7.1 g/dL (ref 6.1–8.1)

## 2018-10-12 LAB — HEMOGLOBIN A1C
EAG (MMOL/L): 5.5 (calc)
Hgb A1c MFr Bld: 5.1 % of total Hgb (ref <5.7)
MEAN PLASMA GLUCOSE: 100 (calc)

## 2018-10-12 LAB — BASIC METABOLIC PANEL
BUN: 12 mg/dL (ref 7–25)
CHLORIDE: 104 mmol/L (ref 98–110)
CO2: 23 mmol/L (ref 20–32)
CREATININE: 0.85 mg/dL (ref 0.50–1.10)
Calcium: 10.1 mg/dL (ref 8.6–10.2)
Glucose, Bld: 96 mg/dL (ref 65–99)
POTASSIUM: 4.1 mmol/L (ref 3.5–5.3)
Sodium: 138 mmol/L (ref 135–146)

## 2018-10-12 LAB — LIPID PANEL
CHOL/HDL RATIO: 4.7 (calc) (ref <5.0)
Cholesterol: 246 mg/dL — ABNORMAL HIGH (ref <200)
HDL: 52 mg/dL (ref >50)
LDL Cholesterol (Calc): 159 mg/dL (calc) — ABNORMAL HIGH
NON-HDL CHOLESTEROL (CALC): 194 mg/dL — AB (ref <130)
Triglycerides: 191 mg/dL — ABNORMAL HIGH (ref <150)

## 2018-10-12 LAB — TSH: TSH: 0.43 mIU/L

## 2018-10-12 MED ORDER — NITROFURANTOIN MONOHYD MACRO 100 MG PO CAPS: 100.0000 mg | ORAL_CAPSULE | Freq: Two times a day (BID) | ORAL | 0 refills | Status: DC

## 2018-10-29 ENCOUNTER — Telehealth: Payer: Self-pay | Admitting: Family

## 2018-10-29 ENCOUNTER — Ambulatory Visit (INDEPENDENT_AMBULATORY_CARE_PROVIDER_SITE_OTHER): Payer: Self-pay

## 2018-10-29 DIAGNOSIS — E010 Iodine-deficiency related diffuse (endemic) goiter: Secondary | ICD-10-CM

## 2018-10-29 DIAGNOSIS — E041 Nontoxic single thyroid nodule: Secondary | ICD-10-CM

## 2018-10-29 NOTE — Telephone Encounter (Signed)
See mychart.  

## 2018-11-08 ENCOUNTER — Ambulatory Visit: Payer: Self-pay | Admitting: Internal Medicine

## 2018-11-08 ENCOUNTER — Encounter: Payer: Self-pay | Admitting: Internal Medicine

## 2018-11-08 VITALS — BP 126/70 | HR 87 | Ht 64.0 in | Wt 172.0 lb

## 2018-11-08 DIAGNOSIS — E041 Nontoxic single thyroid nodule: Secondary | ICD-10-CM

## 2018-11-08 NOTE — Progress Notes (Signed)
Patient ID: Jenna [name], female   DOB: [birth year], 27 y.o.   MRN: [id number]    HPI  Jenna [name] is a 27 y.o.-year-old female, referred by her PCP, Jenna Alar, NP, for evaluation for a right thyroid nodule.  She is here with her mother who offers part of the history especially about the timeline of her thyroid nodule diagnosis and also patient's medical history.  Her nodule was found at the time of APE with her PCP.  At that time, PCP noted that she had a lump in her neck and sent her for a thyroid ultrasound.  The ultrasound showed a large right thyroid nodule and a complex right smaller nodule (see below)  Thyroid U/S (10/29/2018): Right inferior 2.1 x 1 x 1.3 cm solid, hypoechoic, thyroid nodule       Pt denies: - feeling nodules in neck - hoarseness - dysphagia - choking - SOB with lying down  I reviewed pt's thyroid tests -normal: Lab Results  Component Value Date   TSH 0.43 10/11/2018   TSH 0.49 07/27/2017   TSH 0.50 12/05/2016   TSH 0.96 11/29/2015   TSH 1.29 07/08/2015   TSH 0.895 08/10/2011    Pt mentions: - + Fatigue - + weight gain - 50 lbs in last 4 years - + heat intolerance - + face flushed - + constipation - + HA - + Palpitations (history of SVT)  + FH of thyroid ds in mother: multiple thyroid nodules - benign, MGGM - also thyroid nodule. No FH of thyroid cancer. No h/o radiation tx to head or neck.  No seaweed or kelp. No recent contrast studies. No steroid use. No herbal supplements. No Biotin supplements or Hair, Skin and Nails vitamins.  Pt also has a history of SVT - on beta blocker, heart murmur, HL.  ROS: Constitutional: + See HPI  Eyes: no blurry vision, no xerophthalmia ENT: no sore throat,  + see HPI Cardiovascular: no CP/SOB/+ palpitations/leg swelling Respiratory: + Cough/no SOB Gastrointestinal: no N/V/D/ + C/+ heartburn Musculoskeletal: no muscle/joint aches Skin: no rashes Neurological: no  tremors/numbness/tingling/dizziness, + headaches Psychiatric: no depression/+ anxiety  Past Medical History:  Diagnosis Date  . Chronic diarrhea   . Cyst of ovary 12/2017  . Heart murmur    since childhood  . Rapid palpitations 07/08/2015  . Vasovagal syncope    Past Surgical History:  Procedure Laterality Date  . WISDOM TOOTH EXTRACTION     Social History   Socioeconomic History  . Marital status: Single    Spouse name: Not on file  . Number of children: 0  . Years of education: Not on file  . Highest education level: Not on file  Occupational History  .  Maintenance manager  Social Needs  . Financial resource strain: Not on file  . Food insecurity:    Worry: Not on file    Inability: Not on file  . Transportation needs:    Medical: Not on file    Non-medical: Not on file  Tobacco Use  . Smoking status: Current Every Day Smoker    Packs/day: 1.00    Types: Cigarettes  . Smokeless tobacco: Never Used  . Tobacco comment: 7 cigarettes daily  Substance and Sexual Activity  . Alcohol use: Yes    Alcohol/week:     Types:     Comment: Rarely, beer, 3 drinks  . Drug use: No  Lifestyle  . Physical activity:    Days per week: Not on file  Minutes per session: Not on file  . Stress: Not on file  Social History Narrative   Lives in [place] with her mother, Jenna [name], and her brother, Jenna [name].  Identifies as [place]. Endorses tobacco use as well as rare THC use. Denies ETOH use. + sexually active. Has implanon.     GED   Installs pools   Current Outpatient Medications on File Prior to Visit  Medication Sig Dispense Refill  . ibuprofen (ADVIL,MOTRIN) 200 MG tablet Take 400 mg by mouth every 6 (six) hours as needed.    . norgestimate-ethinyl estradiol (SPRINTEC 28) 0.25-35 MG-MCG tablet Take 1 tablet by mouth daily. 28 tablet 11  . propranolol (INDERAL) 10 MG tablet Take 1 tablet (10 mg total) by mouth 4 (four) times daily as needed (palpitations).  30 tablet 6   No current facility-administered medications on file prior to visit.    Allergies  Allergen Reactions  . Adhesive [Tape] Itching and Rash    Blistering of skin     Family History  Problem Relation Age of Onset  . Arrhythmia Maternal Grandmother   . Hypertension Maternal Grandfather   . Diabetes Maternal Grandfather   . Heart attack Maternal Grandfather   . Heart disease Maternal Grandfather   . Heart murmur Mother   . Melanoma Mother        stage 3 malignant melanoma  . Healthy Father   . Healthy Brother   . Scoliosis Brother   . Other Maternal Aunt        TACHYCARDIA  . Diabetes Paternal Grandfather   . Arrhythmia Other        Aunt w/ tachyarrythmia at age 79  . Cancer Other     PE: BP 126/70   Pulse 87   Ht 5\' 4"  (1.626 m)   Wt 172 lb (78 kg)   LMP 10/24/2018   SpO2 98%   BMI 29.52 kg/m  Wt Readings from Last 3 Encounters:  11/08/18 172 lb (78 kg)  10/11/18 171 lb (77.6 kg)  09/18/18 173 lb (78.5 kg)   Constitutional: overweight, in NAD Eyes: PERRLA, EOMI, no exophthalmos ENT: moist mucous membranes, no thyromegaly but visible fulness of R thyroid lobe, no cervical lymphadenopathy Cardiovascular: RRR, No MRG Respiratory: CTA B Gastrointestinal: abdomen soft, NT, ND, BS+ Musculoskeletal: no deformities, strength intact in all 4;  Skin: moist, warm, no rashes Neurological: no tremor with outstretched hands, DTR normal in all 4  ASSESSMENT: 1. Thyroid nodule - right   PLAN: 1. Thyroid nodule - I reviewed the images of her thyroid ultrasound along with the patient and her mother. I pointed out that the dominant nodule is fairly large, this being a risk factor for cancer.  Otherwise, the nodule is - hypoechoic - without microcalcifications - without internal blood flow - more wide than tall - well delimited from surrounding tissue, but with possible atypical lateral extension Pt does not have a thyroid cancer family history or a personal  history of RxTx to head/neck. All these would favor benignity.  - the only way that we can tell exactly if it is cancer or not is by doing a thyroid biopsy (FNA). I explained what the test entails.  Her mother is aware of the procedure as she also had this done.  I explained that the results could be benign, malignant, or inconclusive.  If the results are inconclusive, we usually send a sample to be analyzed for molecular pathology [facility] test).  I explained  the possible results of this test. - patient decided to have the FNA done now >> I ordered this.  - I explained that this is not cancer, we can continue to follow her on a yearly basis, and check another ultrasound in another year or 2. - she should let me know if she develops neck compression symptoms, in that case, we might need to do either lobectomy or thyroidectomy - I did explain that, while thyroid surgery is not a complicated one, it still can have side effects and also she might have a risk of ~25% of becoming hypothyroid after hemithyroidectomy.  - I will see her back in a year, assuming her FNA is normal. If FNA abnormal, we will meet sooner.   Orders Placed This Encounter  Procedures  . [place] FNA BX THYROID 1ST LESION AFIRMA   I will addend the results when they become available.  Philemon Kingdom, MD PhD [facility]

## 2018-11-08 NOTE — Patient Instructions (Signed)
We will schedule a thyroid biopsy for you.  Please come back for a follow-up appointment in 1 year.   Thyroid Biopsy The thyroid gland is a butterfly-shaped gland located in the front of the neck. It produces hormones that affect metabolism, growth and development, and body temperature. Thyroid biopsy is a procedure in which small samples of tissue or fluid are removed from the thyroid gland. The samples are then looked at under a microscope to check for abnormalities. This procedure is done to determine the cause of thyroid problems. It may be done to check for infection, cancer, or other thyroid problems. Two methods may be used for a thyroid biopsy. In one method, a thin needle is inserted through the skin and into the thyroid gland. In the other method, an open incision is made through the skin. Tell a health care provider about:  Any allergies you have.  All medicines you are taking, including vitamins, herbs, eye drops, creams, and over-the-counter medicines.  Any problems you or family members have had with anesthetic medicines.  Any blood disorders you have.  Any surgeries you have had.  Any medical conditions you have. What are the risks? Generally, this is a safe procedure. However, problems can occur and include:  Bleeding from the procedure site.  Infection.  Injury to structures near the thyroid gland.  What happens before the procedure?  Ask your health care provider about: ? Changing or stopping your regular medicines. This is especially important if you are taking diabetes medicines or blood thinners. ? Taking medicines such as aspirin and ibuprofen. These medicines can thin your blood. Do not take these medicines before your procedure if your health care provider asks you not to.  Do not eat or drink anything after midnight on the night before the procedure or as directed by your health care provider.  You may have a blood sample taken. What happens during the  procedure? Either of these methods may be used to perform a thyroid biopsy:  Fine needle biopsy. You may be given medicine to help you relax (sedative). You will be asked to lie on your back with your head tipped backward to extend your neck. An area on your neck will be cleaned. A needle will then be inserted through the skin of your neck. You may be asked to avoid coughing, talking, swallowing, or making sounds during some portions of the procedure. The needle will be withdrawn once the tissue or fluid samples have been removed. Pressure may be applied to your neck to reduce swelling and ensure that bleeding has stopped. The samples will be sent to a lab for examination.  Open biopsy. You will be given medicine to make you sleep (general anesthetic). An incision will be made in your neck. A sample of thyroid tissue will be removed using surgical tools. The tissue sample will be sent for examination. In some cases, the sample may be examined during the biopsy. If that is done and cancer cells are found, some or all of the thyroid gland may be removed. The incision will be closed with stitches.  What happens after the procedure?  Your recovery will be assessed and monitored.  You may have soreness and tenderness at the site of the biopsy. This should go away after a few days.  If you had an open biopsy, you may have a hoarse voice or sore throat for a couple days.  It is your responsibility to get your test results. This information is not  intended to replace advice given to you by your health care provider. Make sure you discuss any questions you have with your health care provider. Document Released: 10/01/2007 Document Revised: 08/06/2016 Document Reviewed: 02/26/2014 Elsevier Interactive Patient Education  2018 [employer].   Thyroid Biopsy, Care After Refer to this sheet in the next few weeks. These instructions provide you with information on caring for yourself after your procedure.  Your health care provider may also give you more specific instructions. Your treatment has been planned according to current medical practices, but problems sometimes occur. Call your health care provider if you have any problems or questions after your procedure. What can I expect after the procedure? After your procedure, it is typical to have the following:  You may have soreness and tenderness at the biopsy site for a few days.  You may have a sore throat or a hoarse voice if you had an open biopsy. This should go away after a couple days.  Follow these instructions at home:  Take medicines only as directed by your health care provider.  To ease discomfort at the biopsy site: ? Keep your head raised on a pillow when you are lying down. ? Support the back of your head and neck with both hands as you sit up from a lying position.  If you have a sore throat, try using throat lozenges or gargling with warm salt water.  Keep all follow-up visits as directed by your health care provider. This is important. Contact a health care provider if:  You have a fever. Get help right away if:  You have severe bleeding from the biopsy site.  You have difficulty swallowing.  You have drainage, redness, swelling, or pain at the biopsy site.  You have swollen glands (lymph nodes) in your neck. This information is not intended to replace advice given to you by your health care provider. Make sure you discuss any questions you have with your health care provider. Document Released: 07/01/2014 Document Revised: 08/06/2016 Document Reviewed: 02/26/2014 Elsevier Interactive Patient Education  [employer].

## 2018-11-20 ENCOUNTER — Other Ambulatory Visit: Payer: Self-pay

## 2018-12-03 ENCOUNTER — Other Ambulatory Visit (HOSPITAL_COMMUNITY)
Admission: RE | Admit: 2018-12-03 | Discharge: 2018-12-03 | Disposition: A | Discharge status: Home / Self Care | Payer: Self-pay | Source: Ambulatory Visit | Attending: Interventional Radiology | Admitting: Interventional Radiology

## 2018-12-03 ENCOUNTER — Ambulatory Visit
Admission: RE | Admit: 2018-12-03 | Discharge: 2018-12-03 | Disposition: A | Discharge status: Home / Self Care | Payer: Self-pay | Source: Ambulatory Visit | Attending: Internal Medicine | Admitting: Internal Medicine

## 2018-12-03 DIAGNOSIS — E041 Nontoxic single thyroid nodule: Secondary | ICD-10-CM

## 2018-12-17 ENCOUNTER — Encounter: Payer: Self-pay | Admitting: Internal Medicine

## 2018-12-26 NOTE — Telephone Encounter (Signed)
I spoke to the cytology department and what they told me is that they were contacted with a request for the patient insurance information and she was listed as self pay and no longer insured. Due to this her results will be delayed and they will be contacting patient to help set her up with payment assistance.

## 2018-12-31 ENCOUNTER — Encounter: Payer: Self-pay | Admitting: Internal Medicine

## 2018-12-31 ENCOUNTER — Encounter (HOSPITAL_COMMUNITY): Payer: Self-pay

## 2019-03-04 ENCOUNTER — Other Ambulatory Visit: Payer: Self-pay | Admitting: Family

## 2019-07-16 ENCOUNTER — Other Ambulatory Visit: Payer: Self-pay | Admitting: Family

## 2019-11-07 ENCOUNTER — Other Ambulatory Visit: Payer: Self-pay

## 2019-11-11 ENCOUNTER — Other Ambulatory Visit: Payer: Self-pay | Admitting: Internal Medicine

## 2019-11-11 ENCOUNTER — Other Ambulatory Visit: Payer: Self-pay

## 2019-11-11 ENCOUNTER — Encounter: Payer: Self-pay | Admitting: Internal Medicine

## 2019-11-11 ENCOUNTER — Ambulatory Visit: Payer: Self-pay | Admitting: Internal Medicine

## 2019-11-11 VITALS — BP 110/72 | HR 84 | Ht 64.0 in | Wt 162.0 lb

## 2019-11-11 DIAGNOSIS — E041 Nontoxic single thyroid nodule: Secondary | ICD-10-CM

## 2019-11-11 DIAGNOSIS — E0590 Thyrotoxicosis, unspecified without thyrotoxic crisis or storm: Secondary | ICD-10-CM

## 2019-11-11 LAB — T3, FREE: T3, Free: 4.3 pg/mL — ABNORMAL HIGH (ref 2.3–4.2)

## 2019-11-11 LAB — T4, FREE: Free T4: 0.83 ng/dL (ref 0.60–1.60)

## 2019-11-11 LAB — TSH: TSH: 0.39 u[IU]/mL (ref 0.35–4.50)

## 2019-11-11 NOTE — Progress Notes (Signed)
Patient ID: Jenna [name], female   DOB: July 29, 1991, 28 y.o.   MRN: [id number]   This visit occurred during the SARS-CoV-2 public health emergency.  Safety protocols were in place, including screening questions prior to the visit, additional usage of staff PPE, and extensive cleaning of exam room while observing appropriate contact time as indicated for disinfecting solutions.   HPI  Jenna [name] is a 28 y.o.-year-old female, referred by her PCP, Debbrah Alar, NP, for evaluation for a right thyroid nodule.  Last visit a year ago.  Reviewed history: Her nodule was found at the time of APE with her PCP.  At that time, PCP noted that she had a lump in her neck and sent her for a thyroid ultrasound.  The ultrasound showed a large right thyroid nodule and a complex right smaller nodule (see below).  Thyroid U/S (10/29/2018): Right inferior 2.1 x 1 x 1.3 cm solid, hypoechoic, thyroid nodule       12/03/2018: FNA:  Adequacy Reason Satisfactory For Evaluation. 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   Afirma molecular marker: Benign  Pt denies: - feeling nodules in neck, but feels some increased pressure with swallowing - hoarseness - dysphagia - choking - SOB with lying down  Patient TFTs were normal: Lab Results  Component Value Date   TSH 0.43 10/11/2018   TSH 0.49 07/27/2017   TSH 0.50 12/05/2016   TSH 0.96 11/29/2015   TSH 1.29 07/08/2015   TSH 0.895 08/10/2011    At last visit, she mentions fatigue, weight gain (50 pounds in the previous 4 years), heat intolerance, constipation, headache, palpitations (h/o SVT).  At this visit, she lost 14 lbs (net) as she started dieting, resolved constipation, still having off and on palpitations.  + FH of thyroid ds in mother: multiple thyroid nodules - benign, MGGM - also thyroid nodule. No FH of thyroid cancer. No h/o  radiation tx to head or neck.  No seaweed or kelp. No recent contrast studies. No herbal supplements. No Biotin use. No recent steroids use.   Pt also has a history of SVT - on beta blocker, heart murmur, HL.  ROS: Constitutional: no weight gain/+ weight loss, no fatigue, no subjective hyperthermia, no subjective hypothermia Eyes: no blurry vision, no xerophthalmia ENT: no sore throat, + see HPI Cardiovascular: no CP/no SOB/+ palpitations/no leg swelling Respiratory: no cough/no SOB/no wheezing Gastrointestinal: no N/no V/no D/no C Musculoskeletal: no muscle aches/no joint aches Skin: no rashes, no hair loss Neurological: no tremors/no numbness/no tingling/no dizziness, + headaches  I reviewed pt's medications, allergies, PMH, social hx, family hx, and changes were documented in the history of present illness. Otherwise, unchanged from my initial visit note.  Past Medical History:  Diagnosis Date  . Chronic diarrhea   . Cyst of ovary 12/2017  . Heart murmur    since childhood  . Rapid palpitations 07/08/2015  . Vasovagal syncope    Past Surgical History:  Procedure Laterality Date  . WISDOM TOOTH EXTRACTION     Social History   Socioeconomic History  . Marital status: Single    Spouse name: Not on file  . Number of children: 0  . Years of education: Not on file  . Highest education level: Not on file  Occupational History  .  Maintenance manager  Social Needs  . Financial resource strain: Not on file  . Food insecurity:    Worry: Not  on file    Inability: Not on file  . Transportation needs:    Medical: Not on file    Non-medical: Not on file  Tobacco Use  . Smoking status: Current Every Day Smoker    Packs/day: 1.00    Types: Cigarettes  . Smokeless tobacco: Never Used  . Tobacco comment: 7 cigarettes daily  Substance and Sexual Activity  . Alcohol use: Yes    Alcohol/week:     Types:     Comment: Rarely, beer, 3 drinks  . Drug use: No  Lifestyle  .  Physical activity:    Days per week: Not on file    Minutes per session: Not on file  . Stress: Not on file  Social History Narrative   Lives in [place] with her mother, Lilybelle Stasiak, and her brother, Lamount Cranker.  Identifies as [place]. Endorses tobacco use as well as rare THC use. Denies ETOH use. + sexually active. Has implanon.     GED   Installs pools   Current Outpatient Medications on File Prior to Visit  Medication Sig Dispense Refill  . ibuprofen (ADVIL,MOTRIN) 200 MG tablet Take 400 mg by mouth every 6 (six) hours as needed.    . propranolol (INDERAL) 10 MG tablet Take 1 tablet (10 mg total) by mouth 4 (four) times daily as needed (palpitations). 30 tablet 6  . SPRINTEC 28 0.25-35 MG-MCG tablet Take 1 tablet by mouth once daily 1 Package 5  . valACYclovir (VALTREX) 1000 MG tablet TAKE 2 TABLETS BY MOUTH NOW AND  AGAIN  IN  12  HOURS 4 tablet 0   No current facility-administered medications on file prior to visit.    Allergies  Allergen Reactions  . Adhesive [Tape] Itching and Rash    Blistering of skin     Family History  Problem Relation Age of Onset  . Arrhythmia Maternal Grandmother   . Hypertension Maternal Grandfather   . Diabetes Maternal Grandfather   . Heart attack Maternal Grandfather   . Heart disease Maternal Grandfather   . Heart murmur Mother   . Melanoma Mother        stage 3 malignant melanoma  . Healthy Father   . Healthy Brother   . Scoliosis Brother   . Other Maternal Aunt        TACHYCARDIA  . Diabetes Paternal Grandfather   . Arrhythmia Other        Aunt w/ tachyarrythmia at age 13  . Cancer Other     PE: BP 110/72   Pulse 84   Ht 5\' 4"  (1.626 m) Comment: measured  Wt 162 lb (73.5 kg)   LMP 10/28/2019   SpO2 98%   BMI 27.81 kg/m  Wt Readings from Last 3 Encounters:  11/11/19 162 lb (73.5 kg)  11/08/18 172 lb (78 kg)  10/11/18 171 lb (77.6 kg)   Constitutional: slightly overweight, in NAD Eyes: PERRLA, EOMI, no  exophthalmos ENT: moist mucous membranes, no thyromegaly but visible and palpable fullness of the right thyroid lobe, no cervical lymphadenopathy Cardiovascular: RRR, No MRG Respiratory: CTA B Gastrointestinal: abdomen soft, NT, ND, BS+ Musculoskeletal: no deformities, strength intact in all 4 Skin: moist, warm, no rashes Neurological: no tremor with outstretched hands, DTR normal in all 4  ASSESSMENT: 1. Thyroid nodule - right   PLAN: 1. Thyroid nodule -I reviewed the images of her thyroid ultrasound and I also reviewed the report along with the patient.  The dominant nodule is fairly  large, this being cancer.  It is also hypoechoic.  However, he does not have microcalcifications, internal blood flow, it is more wide than tall and is well delimited from the surrounding tissue.  However, he does have possibly atypical lateral extension.  She does not have a thyroid cancer family history or personal history of radiation therapy to head or neck.  These would favor benignity. -At last visit, we checked an FNA of this nodule and this returned inconclusive, but then a formal molecular marker was reassuring, returning benign.  In this case, we discussed that the risk of cancer in this nodule is extremely low, not 0, though.  Therefore, we decided to check another ultrasound this year and repeat the biopsy if the thyroid nodule changed ultrasound characteristics.  Otherwise, we plan to repeat the thyroid ultrasound in another year.  If this is also reassuring, we may wait 2 to 3 years before repeating another ultrasound -We reviewed together her TFTs and it appears that her TSH is close to the lower limit of normal on the previous checks.  We will check this again today.  If the TSH is suppressed, the next step would be to obtain a thyroid uptake and scan to check if the right thyroid nodule is hot.  If so, the risk of cancer is extremely small -I will be in touch with her about the results of her TFTs  ending the thyroid ultrasound -Otherwise, I will see her back in a year  Orders Placed This Encounter  Procedures  . US THYROID  . xtpit - TSH  . xtpit - free T4  . xtpit - free T3   - time spent with the patient: 15 minutes, of which >50% was spent in obtaining information about her symptoms, reviewing her previous labs, evaluations, and treatments, counseling her about her condition (please see the discussed topics above), and developing a plan to further investigate and treat it; she had a number of questions which I addressed.  Component     Latest Ref Rng & Units 11/11/2019  TSH     0.35 - 4.50 uIU/mL 0.39  T4,Free(Direct)     0.60 - 1.60 ng/dL 0.83  Triiodothyronine,Free,Serum     2.3 - 4.2 pg/mL 4.3 (H)   12/01/2019: Thyroid U/S: CLINICAL DATA:  Prior ultrasound follow-up. History of right-sided thyroid nodule fine-needle aspiration performed 12/03/2018.  Parenchymal Echotexture: Mildly heterogenous Isthmus: Normal in size measures 0.3 cm in diameter Right lobe: Borderline enlarged measuring 6.8 x 1.4 x 2.0 cm, unchanged, previously, 6.7 x 1.9 x 2.0 cm Left lobe: Normal in size measuring 5.6 x 1.4 x 1.7 cm, unchanged previously, 5.0 x 1.4 x 1.7 cm _________________________________________________________  The previously biopsied nodule within the inferior pole of the right lobe of the thyroid (labeled 1) is unchanged to slightly increased in size compared to the 10/2018 examination, currently measuring 2.4 x 1.7 x 1.2 cm, previously, 2.1 x 1.3 x 1.0 cm. Correlation with previous biopsy results is advised. _________________________________________________________  Nodule # 2: Prior biopsy: No Location: Right; Inferior Maximum size: 1.0 cm; Other 2 dimensions: 0.9 x 0.7 cm, previously, 0.9 x 0.8 x 0.7 cm Composition: solid/almost completely solid (2) Echogenicity: hypoechoic (2) Echogenic foci: punctate echogenic foci (3) ACR TI-RADS total points: 7. Change  in features: Yes nodule now appears to contain punctate echogenic foci Change in ACR TI-RADS risk category: Yes previously characterized as a TR4 nodule, currently a TR5 nodule ACR TI-RADS recommendations:  **Given size (>/= 1.0 cm)  and appearance, fine needle aspiration of this highly suspicious nodule should be considered based on TI-RADS criteria.  _________________________________________________________  There is an approximately 0.6 x 0.5 x 0.5 cm well-defined hypoechoic nodule within the mid aspect the right lobe of the thyroid which is unchanged in hind site compared to the 11/2018 examination and again does not meet imaging criteria to recommend percutaneous sampling or continued dedicated follow-up.  There is a punctate (approximately 0.2 cm) hypoechoic nodule within the left lobe of the thyroid which is unchanged in hind site compared to the 11/2018 examination and again does not meet imaging criteria to recommend percutaneous sampling or continued dedicated follow-up.  IMPRESSION: 1. Findings suggestive of multinodular goiter. No definitive new worrisome thyroid nodules. 2. Nodule #2 now appears to contain punctate echogenic foci and as such has been up graded from a TR4 nodule to a TR5 nodule and now meets imaging criteria to recommend percutaneous sampling as clinically indicated. 3. Previously biopsied nodule within the inferior pole of the right lobe of the thyroid is unchanged to slightly increased in size compared to the 10/2018 examination, currently measuring 2.4 cm, previously, 2.1 cm. Correlation with previous biopsy results is advised. Regardless of slight increase in size, assuming a benign pathologic diagnosis, repeat sampling and/or continued dedicated follow-up is not recommended.  The above is in keeping with the ACR TI-RADS recommendations - J Am Coll Radiol 2017;14:587-595.  Electronically Signed   By: Sandi Mariscal M.D.   On: 12/01/2019  11:58  12/02/2019: Thyroid Uptake and scan: Hot nodule at inferior pole of RIGHT thyroid lobe with mild suppression of uptake throughout remaining thyroid tissue. No additional areas of increased or decreased tracer localization.  4 hour I-123 uptake = 14.5% (normal 5-20%)  24 hour I-123 uptake = 22.6% (normal 10-30%)  IMPRESSION: Normal 4 hour and 24 hour radio iodine uptakes.  Hot nodule at inferior pole RIGHT thyroid lobe with mild suppression of uptake of tracer within remaining thyroid tissue consistent with hyperfunctional RIGHT inferior thyroid adenoma.  Electronically Signed   By: Lavonia Dana M.D.   On: 12/02/2019 12:04  Msg sent: Dear Jenna [name], The thyroid ultrasound and the thyroid uptake and scan are both back and they show: -Your thyroid is not producing a high amount of hormones overall, however, you do have a right-sided thyroid nodule that is producing more hormones than the rest of the gland. As long as you are thyroid tests remain normal, no intervention is needed for this, but we will need to keep an eye on the thyroid tests. -The right thyroid nodule that we biopsied is approximately the same size as before or slightly larger, but with a benign molecular marker result, and especially since this could be overproducing hormones (these nodules are usually noncancerous), I would not recommend a repeat biopsy -However, there is another, smaller, right nodule that appears to contain some calcifications on the new ultrasound. Therefore, I would recommend biopsy for this nodule. Please let me know if this is okay with you and I will order it. Sincerely, Philemon Kingdom MD  Bx ordered - pending.  Philemon Kingdom, MD PhD [facility]

## 2019-11-11 NOTE — Progress Notes (Deleted)
Name: Jenna [name]  MRN/ DOB: [id number], 1991-05-07    Age/ Sex: 28 y.o., female     PCP: Debbrah Alar, NP   Reason for Endocrinology Evaluation: Right thyroid nodule      Initial Endocrinology Clinic Visit: 11/09/2019    PATIENT IDENTIFIER: Jenna [name] is a 28 y.o., female with unremarkable  past medical history.  She has followed with [facility] since 11/09/2019 for consultative assistance with management of her right thyroid nodule .   HISTORICAL SUMMARY: The patient was first diagnosed with a right thyroid nodule during annual physical exam in , a thyroid ultrasound showed a   SUBJECTIVE:   During last visit (***): ***  Today (11/11/2019):  Jenna [name] is here for ****   ROS:  As per HPI.   HISTORY:  Past Medical History:  Past Medical History:  Diagnosis Date  . Chronic diarrhea   . Cyst of ovary 12/2017  . Heart murmur    since childhood  . Rapid palpitations 07/08/2015  . Vasovagal syncope     Past Surgical History:  Past Surgical History:  Procedure Laterality Date  . WISDOM TOOTH EXTRACTION       Social History:  reports that she has been smoking cigarettes. She has been smoking about 1.00 pack per day. She has never used smokeless tobacco. She reports current alcohol use of about 1.0 standard drinks of alcohol per week. She reports that she does not use drugs. Family History:  Family History  Problem Relation Age of Onset  . Arrhythmia Maternal Grandmother   . Hypertension Maternal Grandfather   . Diabetes Maternal Grandfather   . Heart attack Maternal Grandfather   . Heart disease Maternal Grandfather   . Heart murmur Mother   . Melanoma Mother        stage 3 malignant melanoma  . Healthy Father   . Healthy Brother   . Scoliosis Brother   . Other Maternal Aunt        TACHYCARDIA  . Diabetes Paternal Grandfather   . Arrhythmia Other        Aunt w/ tachyarrythmia at age 7  . Cancer Other       HOME  MEDICATIONS: Allergies as of 11/11/2019      Reactions   Adhesive [tape] Itching, Rash   Blistering of skin       Medication List       Accurate as of November 11, 2019  8:10 AM. If you have any questions, ask your nurse or doctor.        ibuprofen 200 MG tablet Commonly known as: ADVIL Take 400 mg by mouth every 6 (six) hours as needed.   propranolol 10 MG tablet Commonly known as: INDERAL Take 1 tablet (10 mg total) by mouth 4 (four) times daily as needed (palpitations).   Sprintec 28 0.25-35 MG-MCG tablet Generic drug: norgestimate-ethinyl estradiol Take 1 tablet by mouth once daily   valACYclovir 1000 MG tablet Commonly known as: VALTREX TAKE 2 TABLETS BY MOUTH NOW AND  AGAIN  IN  12  HOURS         OBJECTIVE:   PHYSICAL EXAM: VS: There were no vitals taken for this visit.   EXAM: General: Pt appears well and is in NAD  Hydration: Well-hydrated with moist mucous membranes and good skin turgor  Eyes: External eye exam normal without stare, lid lag or exophthalmos.  EOM intact.  PERRL.  Ears, Nose, Throat: Hearing: Grossly intact bilaterally Dental:  Good dentition  Throat: Clear without mass, erythema or exudate  Neck: General: Supple without adenopathy. Thyroid: Thyroid size normal.  No goiter or nodules appreciated. No thyroid bruit.  Lungs: Clear with good BS bilat with no rales, rhonchi, or wheezes  Heart: Auscultation: RRR.  Abdomen: Normoactive bowel sounds, soft, nontender, without masses or organomegaly palpable  Extremities: Gait and station: Normal gait  Digits and nails: No clubbing, cyanosis, petechiae, or nodes Head and neck: Normal alignment and mobility BL UE: Normal ROM and strength. BL LE: No pretibial edema normal ROM and strength.  Skin: Hair: Texture and amount normal with gender appropriate distribution Skin Inspection: No rashes, acanthosis nigricans/skin tags. No lipohypertrophy Skin Palpation: Skin temperature, texture, and thickness  normal to palpation  Neuro: Cranial nerves: II - XII grossly intact  Cerebellar: Normal coordination and movement; no tremor Motor: Normal strength throughout DTRs: 2+ and symmetric in UE without delay in relaxation phase  Mental Status: Judgment, insight: Intact Orientation: Oriented to time, place, and person Memory: Intact for recent and remote events Mood and affect: No depression, anxiety, or agitation     DATA REVIEWED: *** FNA RLP (11/23/2018)   Diagnosis THYROID, FINE NEEDLE ASPIRATION, RLP (SPECIMEN 1 OF 1, COLLECTED 12/03/2018): 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 ***  Plan:  ***    Medications   ***   Signed electronically by: Mack Guise, MD  [facility]  [address] [place], [place] [address] Phone: [phone] FAX: [phone]      CC: Debbrah Alar, NP [address] [place] [address] Phone: [phone]  Fax: [phone]   Return to Endocrinology clinic as below: Future Appointments  Date Time Provider [place]  11/11/2019  8:50 AM Norris Brumbach, Melanie Crazier, MD [id number] None

## 2019-11-11 NOTE — Patient Instructions (Signed)
Please stop at the lab.  We will check another thyroid ultrasound now.  Please come back for a follow-up appointment in 1 year.

## 2019-12-01 ENCOUNTER — Encounter (HOSPITAL_COMMUNITY)
Admission: RE | Admit: 2019-12-01 | Discharge: 2019-12-01 | Disposition: A | Discharge status: Home / Self Care | Payer: Self-pay | Source: Ambulatory Visit | Attending: Internal Medicine | Admitting: Internal Medicine

## 2019-12-01 ENCOUNTER — Other Ambulatory Visit: Payer: Self-pay

## 2019-12-01 ENCOUNTER — Ambulatory Visit (HOSPITAL_COMMUNITY)
Admission: RE | Admit: 2019-12-01 | Discharge: 2019-12-01 | Disposition: A | Discharge status: Home / Self Care | Payer: Self-pay | Source: Ambulatory Visit | Attending: Internal Medicine | Admitting: Internal Medicine

## 2019-12-01 DIAGNOSIS — E041 Nontoxic single thyroid nodule: Secondary | ICD-10-CM | POA: Insufficient documentation

## 2019-12-01 DIAGNOSIS — E0590 Thyrotoxicosis, unspecified without thyrotoxic crisis or storm: Secondary | ICD-10-CM | POA: Insufficient documentation

## 2019-12-01 MED ORDER — SODIUM IODIDE I-123 7.4 MBQ CAPS
457.0000 | ORAL_CAPSULE | Freq: Once | ORAL | Status: AC
  Administered 2019-12-01: 457 via ORAL

## 2019-12-02 ENCOUNTER — Encounter (HOSPITAL_COMMUNITY)
Admission: RE | Admit: 2019-12-02 | Discharge: 2019-12-02 | Disposition: A | Discharge status: Home / Self Care | Payer: Self-pay | Source: Ambulatory Visit | Attending: Internal Medicine | Admitting: Internal Medicine

## 2019-12-03 ENCOUNTER — Encounter: Payer: Self-pay | Admitting: Internal Medicine

## 2019-12-03 ENCOUNTER — Other Ambulatory Visit: Payer: Self-pay | Admitting: Internal Medicine

## 2019-12-03 DIAGNOSIS — E041 Nontoxic single thyroid nodule: Secondary | ICD-10-CM

## 2019-12-23 ENCOUNTER — Inpatient Hospital Stay: Admission: RE | Admit: 2019-12-23 | Payer: Self-pay | Source: Ambulatory Visit

## 2020-01-27 ENCOUNTER — Ambulatory Visit
Admission: RE | Admit: 2020-01-27 | Discharge: 2020-01-27 | Disposition: A | Discharge status: Home / Self Care | Payer: BC Managed Care – PPO | Source: Ambulatory Visit | Attending: Internal Medicine | Admitting: Internal Medicine

## 2020-01-27 ENCOUNTER — Other Ambulatory Visit (HOSPITAL_COMMUNITY)
Admission: RE | Admit: 2020-01-27 | Discharge: 2020-01-27 | Disposition: A | Discharge status: Home / Self Care | Payer: Self-pay | Source: Ambulatory Visit | Attending: Internal Medicine | Admitting: Internal Medicine

## 2020-01-27 DIAGNOSIS — E042 Nontoxic multinodular goiter: Secondary | ICD-10-CM | POA: Diagnosis not present

## 2020-01-27 DIAGNOSIS — E041 Nontoxic single thyroid nodule: Secondary | ICD-10-CM | POA: Insufficient documentation

## 2020-01-28 LAB — CYTOLOGY - NON PAP

## 2020-02-10 ENCOUNTER — Encounter: Payer: Self-pay | Admitting: Internal Medicine

## 2020-02-13 ENCOUNTER — Encounter: Payer: Self-pay | Admitting: Internal Medicine

## 2020-02-16 ENCOUNTER — Telehealth: Payer: Self-pay

## 2020-02-16 NOTE — Telephone Encounter (Signed)
I will call [facility] to check on status.

## 2020-02-16 NOTE — Telephone Encounter (Signed)
-----   Message from Philemon Kingdom, MD sent at 02/13/2020  2:42 PM EST ----- M, Can you please check with the cytology department?  She had a thyroid biopsy more than 2 weeks ago and we still did not receive the [facility] test. Ty, C

## 2020-02-18 ENCOUNTER — Telehealth: Payer: Self-pay

## 2020-02-18 NOTE — Telephone Encounter (Signed)
Yes, we need it!! Thank you!!!

## 2020-02-18 NOTE — Telephone Encounter (Signed)
Contacted [facility] to check on status.  [facility] states patient was marked self pay so it was held until 02/11/2020 and results should be done by 02/28/2020.

## 2020-02-18 NOTE — Telephone Encounter (Signed)
[facility] called me back regarding the conversation we had this morning when I called to check on status.  I was told the sample had been on hold as she was self pay and did not start getting processed until 2/26.  The person I spoke to this time told me they have not started testing at all yet, it is still on hold because of two reasons  1. There are three different ordering doctors on the req form  2. They wanted to know if we were aware that the same nodule was previously tested and they do not recommend repeating it.  I assured the person I spoke to that we are aware it was previously tested and we are still wanting it tested. They will now go ahead with the test.  I made sure they have our clinic name and fax number .

## 2020-02-18 NOTE — Telephone Encounter (Signed)
Perfect!  Thank you so much for checking on this!

## 2020-02-25 ENCOUNTER — Other Ambulatory Visit: Payer: Self-pay | Admitting: Family

## 2020-02-26 ENCOUNTER — Other Ambulatory Visit: Payer: Self-pay | Admitting: Internal Medicine

## 2020-02-26 ENCOUNTER — Encounter: Payer: Self-pay | Admitting: Internal Medicine

## 2020-02-26 ENCOUNTER — Encounter (HOSPITAL_COMMUNITY): Payer: Self-pay

## 2020-02-26 DIAGNOSIS — E041 Nontoxic single thyroid nodule: Secondary | ICD-10-CM

## 2020-02-26 NOTE — Telephone Encounter (Signed)
Jenna [name] -- please advise refill request. Pt has not been seen by you since 2019.

## 2020-02-27 NOTE — Telephone Encounter (Signed)
Needs OV prior to additional refills. Looks like last refill from our office was 03/04/19.

## 2020-02-27 NOTE — Telephone Encounter (Signed)
Per patient rx was sent to [place] by error. "She will call for an appointment if need it"

## 2020-03-24 ENCOUNTER — Ambulatory Visit: Payer: Self-pay | Admitting: Surgery

## 2020-03-24 DIAGNOSIS — E0590 Thyrotoxicosis, unspecified without thyrotoxic crisis or storm: Secondary | ICD-10-CM | POA: Diagnosis not present

## 2020-03-24 DIAGNOSIS — D440 Neoplasm of uncertain behavior of thyroid gland: Secondary | ICD-10-CM | POA: Diagnosis not present

## 2020-03-24 DIAGNOSIS — E042 Nontoxic multinodular goiter: Secondary | ICD-10-CM | POA: Diagnosis not present

## 2020-04-19 IMAGING — NM NM THYROID IMAGING W/ UPTAKE MULTI (4&24 HR)
4 series · 4 of 4 positions shown · non-contrast
Comparison: Thyroid ultrasound 12/01/2019

CLINICAL DATA: Thyrotoxicosis without thyroid storm, low TSH

EXAM:
THYROID SCAN AND UPTAKE - 4 AND 24 HOURS
TECHNIQUE: Following oral administration of A-MM6 capsule, anterior planar
imaging was acquired at 24 hours. Thyroid uptake was calculated with
a thyroid probe at 4-6 hours and 24 hours.
RADIOPHARMACEUTICALS:  457 uCi A-MM6 sodium iodide p.o.

[iu thyroid uptake i123 · 3.10mm/px · 1 of 1 slices shown (1 of 4)]
[im 1/1]
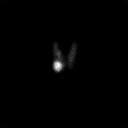

[iu thyroid uptake i123 · 3.10mm/px · 1 of 1 slices shown (2 of 4)]
[im 1/1]
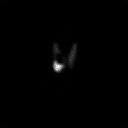

[iu thyroid uptake i123 · 3.10mm/px · 1 of 1 slices shown (3 of 4)]
[im 1/1]
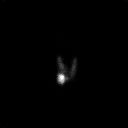

[iu thyroid uptake i123 · 3.10mm/px · 1 of 1 slices shown (4 of 4)]
[im 1/1]
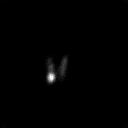

[4 of 4 positions shown; findings below may reference images not displayed]

FINDINGS: Hot nodule at inferior pole of RIGHT thyroid lobe with mild
suppression of uptake throughout remaining thyroid tissue. No
additional areas of increased or decreased tracer localization.

4 hour A-MM6 uptake = 14.5% (normal 5-20%)

24 hour A-MM6 uptake = 22.6% (normal 10-30%)
IMPRESSION: Normal 4 hour and 24 hour radio iodine uptakes.

Hot nodule at inferior pole RIGHT thyroid lobe with mild suppression
of uptake of tracer within remaining thyroid tissue consistent with
hyperfunctional RIGHT inferior thyroid adenoma.

## 2020-04-27 ENCOUNTER — Ambulatory Visit: Payer: Self-pay | Admitting: Family

## 2020-05-03 ENCOUNTER — Other Ambulatory Visit: Payer: Self-pay

## 2020-05-03 ENCOUNTER — Ambulatory Visit (INDEPENDENT_AMBULATORY_CARE_PROVIDER_SITE_OTHER): Payer: BC Managed Care – PPO | Admitting: Family

## 2020-05-03 ENCOUNTER — Encounter: Payer: Self-pay | Admitting: Family

## 2020-05-03 VITALS — BP 118/78 | HR 79 | Temp 97.6°F | Resp 17 | Ht 64.0 in | Wt 165.0 lb

## 2020-05-03 DIAGNOSIS — F419 Anxiety disorder, unspecified: Secondary | ICD-10-CM | POA: Diagnosis not present

## 2020-05-03 DIAGNOSIS — G43909 Migraine, unspecified, not intractable, without status migrainosus: Secondary | ICD-10-CM

## 2020-05-03 DIAGNOSIS — N764 Abscess of vulva: Secondary | ICD-10-CM

## 2020-05-03 DIAGNOSIS — E041 Nontoxic single thyroid nodule: Secondary | ICD-10-CM | POA: Diagnosis not present

## 2020-05-03 DIAGNOSIS — B002 Herpesviral gingivostomatitis and pharyngotonsillitis: Secondary | ICD-10-CM

## 2020-05-03 DIAGNOSIS — Z3041 Encounter for surveillance of contraceptive pills: Secondary | ICD-10-CM

## 2020-05-03 MED ORDER — CEPHALEXIN 500 MG PO CAPS: 500.0000 mg | ORAL_CAPSULE | Freq: Three times a day (TID) | ORAL | 0 refills | Status: DC

## 2020-05-03 MED ORDER — NORGESTIMATE-ETH ESTRADIOL 0.25-35 MG-MCG PO TABS: 1.0000 | ORAL_TABLET | Freq: Every day | ORAL | 11 refills | Status: DC

## 2020-05-03 NOTE — Patient Instructions (Signed)
Continue warm compresses to abscess twice daily. Begin keflex (antibiotic). You should be contacted about your referral to GYN. Call if symptoms worsen or if they do not improve with antibiotics.

## 2020-05-03 NOTE — Progress Notes (Signed)
Subjective:    Patient ID: Jenna [name], female    DOB: 18-Oct-1991, 29 y.o.   MRN: [id number]  HPI  Patient is a 29 yr old female who presents today for follow up.  Contraceptive management- continues sprintec. Reports that her periods are regular and last about 4 days on this pill.   Right Thyroid Nodule- Reports that she will have a right thyroidectomy in June. She continues to follow with Endocrinology.   Lab Results  Component Value Date   TSH 0.39 11/11/2019   Anxiety- overall anxiety is stable.  Has had a bit more stress recently related to her upcoming surgery, but overall seems to be handling it well.  Migraines- Has had more frequent HA's.  No photophobia  Abscess- has been present x 1 month- started doing soaks.    HSV1- using OTC abrevia.  This is helping currently.   Review of Systems   See HPI  Past Medical History:  Diagnosis Date  . Chronic diarrhea   . Cyst of ovary 12/2017  . Heart murmur    since childhood  . Rapid palpitations 07/08/2015  . Vasovagal syncope      Social History   Socioeconomic History  . Marital status: Single    Spouse name: Not on file  . Number of children: Not on file  . Years of education: Not on file  . Highest education level: Not on file  Occupational History  . Not on file  Tobacco Use  . Smoking status: Current Every Day Smoker    Packs/day: 1.00    Types: Cigarettes  . Smokeless tobacco: Never Used  . Tobacco comment: 7 cigarettes daily  Substance and Sexual Activity  . Alcohol use: Yes    Alcohol/week: 1.0 standard drinks    Types: 1 Standard drinks or equivalent per week    Comment: 2 beers/week  . Drug use: No  . Sexual activity: Yes    Partners: Male    Birth control/protection: Pill    Comment: Patient declined to answer sexual history.  Other Topics Concern  . Not on file  Social History Narrative   Lives in [place] with her mother, Alandrea Suleman, and her brother, Lamount Cranker.  Identifies as White  and [place]. Endorses tobacco use as well as rare THC use. Denies ETOH use. + sexually active. Has implanon.     GED   Installs pools   Social Determinants of Health   Financial Resource Strain:   . Difficulty of Paying Living Expenses:   Food Insecurity:   . Worried About Charity fundraiser in the Last Year:   . Arboriculturist in the Last Year:   Transportation Needs:   . Film/video editor (Medical):   Marland Kitchen Lack of Transportation (Non-Medical):   Physical Activity:   . Days of Exercise per Week:   . Minutes of Exercise per Session:   Stress:   . Feeling of Stress :   Social Connections:   . Frequency of Communication with Friends and Family:   . Frequency of Social Gatherings with Friends and Family:   . Attends Religious Services:   . Active Member of Clubs or Organizations:   . Attends Archivist Meetings:   Marland Kitchen Marital Status:   Intimate Partner Violence:   . Fear of Current or Ex-Partner:   . Emotionally Abused:   Marland Kitchen Physically Abused:   . Sexually Abused:     Past Surgical History:  Procedure Laterality  Date  . WISDOM TOOTH EXTRACTION      Family History  Problem Relation Age of Onset  . Arrhythmia Maternal Grandmother   . Hypertension Maternal Grandfather   . Diabetes Maternal Grandfather   . Heart attack Maternal Grandfather   . Heart disease Maternal Grandfather   . Heart murmur Mother   . Melanoma Mother        stage 3 malignant melanoma  . Healthy Father   . Healthy Brother   . Scoliosis Brother   . Other Maternal Aunt        TACHYCARDIA  . Diabetes Paternal Grandfather   . Arrhythmia Other        Aunt w/ tachyarrythmia at age 35  . Cancer Other     Allergies  Allergen Reactions  . Adhesive [Tape] Itching and Rash    Blistering of skin      Current Outpatient Medications on File Prior to Visit  Medication Sig Dispense Refill  . SPRINTEC 28 0.25-35 MG-MCG tablet Take 1 tablet by mouth once daily 1 Package 5  .  valACYclovir (VALTREX) 1000 MG tablet TAKE 2 TABLETS BY MOUTH NOW AND  AGAIN  IN  12  HOURS 4 tablet 0   No current facility-administered medications on file prior to visit.    BP 118/78 (BP Location: Left Arm, Patient Position: Sitting, Cuff Size: Large)   Pulse 79   Temp 97.6 F (36.4 C) (Temporal)   Resp 17   Ht 5\' 4"  (1.626 m)   Wt 165 lb (74.8 kg)   SpO2 97%   BMI 28.32 kg/m       Objective:   Physical Exam Constitutional:      Appearance: She is well-developed.  Neck:     Thyroid: No thyromegaly.  Cardiovascular:     Rate and Rhythm: Normal rate and regular rhythm.     Heart sounds: Normal heart sounds. No murmur.  Pulmonary:     Effort: Pulmonary effort is normal. No respiratory distress.     Breath sounds: Normal breath sounds. No wheezing.  Genitourinary:      Comments: Approximately 0.5cm linear open area of drainage with minimal associated erythema Musculoskeletal:     Cervical back: Neck supple.  Skin:    General: Skin is warm and dry.  Neurological:     Mental Status: She is alert and oriented to person, place, and time.  Psychiatric:        Behavior: Behavior normal.        Thought Content: Thought content normal.        Judgment: Judgment normal.           Assessment & Plan:   Vulvar abscess- pt drained it. Will rx with keflex. Since this is recurrent for her I will also refer her to GYN for ongoing management.  Migraines- having occasional HA's but do not seem to be migraine level. Monitor.  Oral herpes-stable with otc medication.  Contraceptive management- continue sprintec.  This visit occurred during the SARS-CoV-2 public health emergency.  Safety protocols were in place, including screening questions prior to the visit, additional usage of staff PPE, and extensive cleaning of exam room while observing appropriate contact time as indicated for disinfecting solutions.

## 2020-05-10 ENCOUNTER — Other Ambulatory Visit (HOSPITAL_COMMUNITY): Payer: Self-pay

## 2020-05-15 ENCOUNTER — Encounter: Payer: Self-pay | Admitting: Surgery

## 2020-05-15 DIAGNOSIS — E0590 Thyrotoxicosis, unspecified without thyrotoxic crisis or storm: Secondary | ICD-10-CM | POA: Diagnosis present

## 2020-05-15 DIAGNOSIS — C73 Malignant neoplasm of thyroid gland: Secondary | ICD-10-CM | POA: Diagnosis present

## 2020-05-15 DIAGNOSIS — E042 Nontoxic multinodular goiter: Secondary | ICD-10-CM | POA: Diagnosis present

## 2020-05-15 NOTE — H&P (Signed)
General Surgery [facility]  Jenna [name] DOB: [birth year] Single / Language: [place] / Race: White Female   History of Present Illness   The patient is a 29 year old female who presents with a thyroid nodule.  CHIEF COMPLAINT: thyroid neoplasm of uncertain behavior  Patient is referred by Dr. Philemon Kingdom for surgical evaluation and management of multiple thyroid nodules and thyroid neoplasm of uncertain behavior. Patient was diagnosed by her primary care provider with right-sided thyroid nodules. She underwent evaluation with ultrasound showing a dominant nodule in the inferior right thyroid lobe. Fine-needle aspiration biopsy and molecular genetic testing found this to be a benign nodule. Patient however has had progressive decrease in her TSH level. Nuclear medicine scan shows that the dominant nodule is a "hot nodule" and her TSH level has been at the low end of normal. Follow-up ultrasound demonstrated slight increase in size of the dominant nodule as well as a new 1 cm nodule in the lower right thyroid lobe which was felt to be highly suspicious for malignancy. We reviewed this ultrasound report today in detail and I provided the patient with a copy. Fine-needle aspiration showed cytologic atypia, Bethesda category III. AFIRMA testing returned as suspicious, rendering a risk of malignancy of 50%. Patient is now referred for consideration for right thyroid lobectomy for definitive diagnosis of the small thyroid nodule and for management of the "hot nodule" in the inferior right thyroid lobe. The patient has had no prior head or neck surgery. She has never been on thyroid medication or anti-thyroid medication. She has never been on beta blockade. There is a family history of thyroid nodules and the patient's mother. There is a history of pituitary tumor and the patient's mother and grandmother. There is a history of adrenal tumor in the patient's maternal  aunt. There is no family history of thyroid cancer. Patient presents today to discuss thyroid surgery. She is accompanied by her mother.   Past Surgical History  Oral Surgery   Diagnostic Studies History  Colonoscopy  never Mammogram  never Pap Smear  1-5 years ago  Allergies  No Known Drug Allergies   Medication History  Sprintec 28 (0.25-35MG -MCG Tablet, Oral) Active. Medications Reconciled  Social History  Alcohol use  Occasional alcohol use. Caffeine use  Carbonated beverages, Coffee, Tea. Illicit drug use  Remotely quit drug use. Tobacco use  Current some day smoker.  Family History  Family history unknown  First Degree Relatives  Heart disease in female family member before age 25  Heart disease in female family member before age 54  Melanoma  Mother. Migraine Headache  Mother.  Pregnancy / Birth History  Age at menarche  86 years. Contraceptive History  Contraceptive implant, Oral contraceptives. Gravida  1 Maternal age  12-25 Para  0 Regular periods   Other Problems  Heart murmur  Other disease, cancer, significant illness  Thyroid Disease   Review of Systems General Present- Fatigue. Not Present- Appetite Loss, Chills, Fever, Night Sweats, Weight Gain and Weight Loss. Skin Not Present- Change in Wart/Mole, Dryness, Hives, Jaundice, New Lesions, Non-Healing Wounds, Rash and Ulcer. HEENT Not Present- Earache, Hearing Loss, Hoarseness, Nose Bleed, Oral Ulcers, Ringing in the Ears, Seasonal Allergies, Sinus Pain, Sore Throat, Visual Disturbances, Wears glasses/contact lenses and Yellow Eyes. Respiratory Not Present- Bloody sputum, Chronic Cough, Difficulty Breathing, Snoring and Wheezing. Breast Not Present- Breast Mass, Breast Pain, Nipple Discharge and Skin Changes. Cardiovascular Present- Palpitations. Not Present- Chest Pain, Difficulty Breathing Lying  Down, Leg Cramps, Rapid Heart Rate, Shortness of Breath and Swelling of  Extremities. Gastrointestinal Present- Bloating and Indigestion. Not Present- Abdominal Pain, Bloody Stool, Change in Bowel Habits, Chronic diarrhea, Constipation, Difficulty Swallowing, Excessive gas, Gets full quickly at meals, Hemorrhoids, Nausea, Rectal Pain and Vomiting. Female Genitourinary Present- Frequency. Not Present- Nocturia, Painful Urination, Pelvic Pain and Urgency. Musculoskeletal Not Present- Back Pain, Joint Pain, Joint Stiffness, Muscle Pain, Muscle Weakness and Swelling of Extremities. Neurological Present- Headaches. Not Present- Decreased Memory, Fainting, Numbness, Seizures, Tingling, Tremor, Trouble walking and Weakness. Psychiatric Present- Anxiety and Change in Sleep Pattern. Not Present- Bipolar, Depression, Fearful and Frequent crying. Endocrine Present- Cold Intolerance, Hair Changes, Heat Intolerance and Hot flashes. Not Present- Excessive Hunger and New Diabetes. Hematology Present- Easy Bruising. Not Present- Blood Thinners, Excessive bleeding, Gland problems, HIV and Persistent Infections.  Vitals  Weight: 166.8 lb Height: 64in Body Surface Area: 1.81 m Body Mass Index: 28.63 kg/m  Temp.: 98.45F (Thermal Scan)  Pulse: 95 (Regular)  BP: 114/64(Sitting, Right Arm, Standard)   Physical Exam   GENERAL APPEARANCE Development: normal Nutritional status: normal Gross deformities: none  SKIN Rash, lesions, ulcers: none Induration, erythema: none Nodules: none palpable  EYES Conjunctiva and lids: normal Pupils: equal and reactive Iris: normal bilaterally  EARS, NOSE, MOUTH, THROAT External ears: no lesion or deformity External nose: no lesion or deformity Hearing: grossly normal Due to Covid-19 pandemic, patient is wearing a mask.  NECK Symmetric: yes Trachea: midline Thyroid: Palpation of the right thyroid lobe shows an approximately 2 cm relatively soft nodule in the inferior pole. No other nodules are palpable on the right side.  Left thyroid lobe is normal to palpation. There is no associated lymphadenopathy palpable on either side.  CHEST Respiratory effort: normal Retraction or accessory muscle use: no Breath sounds: normal bilaterally Rales, rhonchi, wheeze: none  CARDIOVASCULAR Auscultation: regular rhythm, normal rate Murmurs: none Pulses: radial pulse 2+ palpable Lower extremity edema: none  MUSCULOSKELETAL Station and gait: normal Digits and nails: no clubbing or cyanosis Muscle strength: grossly normal all extremities Range of motion: grossly normal all extremities Deformity: none  LYMPHATIC Cervical: none palpable Supraclavicular: none palpable  PSYCHIATRIC Oriented to person, place, and time: yes Mood and affect: normal for situation Judgment and insight: appropriate for situation    Assessment & Plan  MULTIPLE THYROID NODULES (E04.2) 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 Patient is accompanied by her mother.  Patient provided with a copy of "The Thyroid Book: Medical and Surgical Treatment of Thyroid Problems", published by [employer], 16 pages. Book reviewed and explained to patient during visit today.  We reviewed her ultrasound report, her nuclear medicine scan, and her cytopathology reports and molecular genetic testing results. I provided the patient with a copy of these studies. Based on all of these studies, I have recommended proceeding with right thyroid lobectomy. We discussed the risk and benefits of the procedure including the risk of injury to recurrent laryngeal nerve and parathyroid glands. We discussed the alternative of proceeding with total thyroidectomy. We discussed the hospital stay to be anticipated. We discussed the location and size of the surgical  incision. We discussed the postoperative recovery and return to work and activities. We discussed the potential need for additional surgery or possibly radioactive iodine treatment. We discussed the potential need for lifelong thyroid hormone supplementation. The patient understands  and wishes to proceed with surgery in May 2021.  The risks and benefits of the procedure have been discussed at length with the patient. The patient understands the proposed procedure, potential alternative treatments, and the course of recovery to be expected. All of the patient's questions have been answered at this time. The patient wishes to proceed with surgery.  Armandina Gemma, MD [facility] Office: [phone]

## 2020-05-18 NOTE — Patient Instructions (Addendum)
DUE TO COVID-19 ONLY ONE VISITOR IS ALLOWED TO COME WITH YOU AND STAY IN THE WAITING ROOM ONLY DURING PRE OP AND PROCEDURE DAY OF SURGERY. THE 2 VISITORS  MAY VISIT WITH YOU AFTER SURGERY IN YOUR PRIVATE ROOM DURING VISITING HOURS ONLY!  YOU NEED TO HAVE A COVID 19 TEST ON_6/7______ @_9 :30______, THIS TEST MUST BE DONE BEFORE SURGERY, COME  Jenna [name] , [id number].  ([place]) ONCE YOUR COVID TEST IS COMPLETED, PLEASE BEGIN THE QUARANTINE INSTRUCTIONS AS OUTLINED IN YOUR HANDOUT.                Jenna [name]    Your procedure is scheduled on: 05/27/20   Report to [facility] Main  Entrance   Report to Short Stay at 5:30  AM     Call this number if you have problems the morning of surgery [phone]    Remember: Do not eat food or drink liquids :After Midnight.   BRUSH YOUR TEETH MORNING OF SURGERY AND RINSE YOUR MOUTH OUT, NO CHEWING GUM CANDY OR MINTS.     Take these medicines the morning of surgery with A SIP OF WATER: Contraceptive                                 You may not have any metal on your body including hair pins and              piercings  Do not wear jewelry, make-up, lotions, powders or perfumes, deodorant             Do not wear nail polish on your fingernails.             Do not shave  48 hours prior to surgery.     Do not bring valuables to the hospital. [place].  Contacts, dentures or bridgework may not be worn into surgery.       Special Instructions: N/A              Please read over the following fact sheets you were given: _____________________________________________________________________             [facility] - Preparing for Surgery    Before surgery, you can play an important role.   Because skin is not sterile, your skin needs to be as free of germs as possible .  You can reduce the number of germs on your skin by washing with CHG  (chlorahexidine gluconate) soap before surgery.   CHG is an antiseptic cleaner which kills germs and bonds with the skin to continue killing germs even after washing. Please DO NOT use if you have an allergy to CHG or antibacterial soaps.   If your skin becomes reddened/irritated stop using the CHG and inform your nurse when you arrive at Short Stay. Do not shave (including legs and underarms) for at least 48 hours prior to the first CHG shower.   . Please follow these instructions carefully:  1.  Shower with CHG Soap the night before surgery and the  morning of Surgery.  2.  If you choose to wash your hair, wash your hair first as usual with your  normal  shampoo.  3.  After you shampoo, rinse your hair and body thoroughly to remove the  shampoo.  4.  Use CHG as you would any other liquid soap.  You can apply chg directly  to the skin and wash                       Gently with a scrungie or clean washcloth.  5.  Apply the CHG Soap to your body ONLY FROM THE NECK DOWN.   Do not use on face/ open                           Wound or open sores. Avoid contact with eyes, ears mouth and genitals (private parts).                       Wash face,  Genitals (private parts) with your normal soap.             6.  Wash thoroughly, paying special attention to the area where your surgery  will be performed.  7.  Thoroughly rinse your body with warm water from the neck down.  8.  DO NOT shower/wash with your normal soap after using and rinsing off  the CHG Soap.             9.  Pat yourself dry with a clean towel.            10.  Wear clean pajamas.            11.  Place clean sheets on your bed the night of your first shower and do not  sleep with pets. Day of Surgery : Do not apply any lotions/deodorants the morning of surgery.  Please wear clean clothes to the hospital/surgery center.  FAILURE TO FOLLOW THESE INSTRUCTIONS MAY RESULT IN THE CANCELLATION OF YOUR  SURGERY PATIENT SIGNATURE_________________________________  NURSE SIGNATURE__________________________________  ________________________________________________________________________   Jenna [name]  An incentive spirometer is a tool that can help keep your lungs clear and active. This tool measures how well you are filling your lungs with each breath. Taking long deep breaths may help reverse or decrease the chance of developing breathing (pulmonary) problems (especially infection) following:  A long period of time when you are unable to move or be active. BEFORE THE PROCEDURE   If the spirometer includes an indicator to show your best effort, your nurse or respiratory therapist will set it to a desired goal.  If possible, sit up straight or lean slightly forward. Try not to slouch.  Hold the incentive spirometer in an upright position. INSTRUCTIONS FOR USE  1. Sit on the edge of your bed if possible, or sit up as far as you can in bed or on a chair. 2. Hold the incentive spirometer in an upright position. 3. Breathe out normally. 4. Place the mouthpiece in your mouth and seal your lips tightly around it. 5. Breathe in slowly and as deeply as possible, raising the piston or the ball toward the top of the column. 6. Hold your breath for 3-5 seconds or for as long as possible. Allow the piston or ball to fall to the bottom of the column. 7. Remove the mouthpiece from your mouth and breathe out normally. 8. Rest for a few seconds and repeat Steps 1 through 7 at least 10 times every 1-2 hours when you are awake. Take your time and take a few normal breaths between deep breaths. 9. The spirometer may include an indicator to show your best effort.  Use the indicator as a goal to work toward during each repetition. 10. After each set of 10 deep breaths, practice coughing to be sure your lungs are clear. If you have an incision (the cut made at the time of surgery), support your  incision when coughing by placing a pillow or rolled up towels firmly against it. Once you are able to get out of bed, walk around indoors and cough well. You may stop using the incentive spirometer when instructed by your caregiver.  RISKS AND COMPLICATIONS  Take your time so you do not get dizzy or light-headed.  If you are in pain, you may need to take or ask for pain medication before doing incentive spirometry. It is harder to take a deep breath if you are having pain. AFTER USE  Rest and breathe slowly and easily.  It can be helpful to keep track of a log of your progress. Your caregiver can provide you with a simple table to help with this. If you are using the spirometer at home, follow these instructions: [place] IF:   You are having difficultly using the spirometer.  You have trouble using the spirometer as often as instructed.  Your pain medication is not giving enough relief while using the spirometer.  You develop fever of 100.5 F (38.1 C) or higher. SEEK IMMEDIATE MEDICAL CARE IF:   You cough up bloody sputum that had not been present before.  You develop fever of 102 F (38.9 C) or greater.  You develop worsening pain at or near the incision site. MAKE SURE YOU:   Understand these instructions.  Will watch your condition.  Will get help right away if you are not doing well or get worse. Document Released: 04/16/2007 Document Revised: 02/26/2012 Document Reviewed: 06/17/2007 [facility] Patient Information 2014 [place], [place].   ________________________________________________________________________

## 2020-05-19 ENCOUNTER — Other Ambulatory Visit: Payer: Self-pay

## 2020-05-19 ENCOUNTER — Encounter (HOSPITAL_COMMUNITY)
Admission: RE | Admit: 2020-05-19 | Discharge: 2020-05-19 | Disposition: A | Discharge status: Home / Self Care | Payer: BC Managed Care – PPO | Source: Ambulatory Visit | Attending: Surgery | Admitting: Surgery

## 2020-05-19 ENCOUNTER — Encounter (HOSPITAL_COMMUNITY): Payer: Self-pay

## 2020-05-19 DIAGNOSIS — Z01812 Encounter for preprocedural laboratory examination: Secondary | ICD-10-CM | POA: Insufficient documentation

## 2020-05-19 HISTORY — DX: Headache, unspecified: R51.9

## 2020-05-19 HISTORY — DX: Depression, unspecified: F32.A

## 2020-05-19 HISTORY — DX: Cardiac arrhythmia, unspecified: I49.9

## 2020-05-19 HISTORY — DX: Anxiety disorder, unspecified: F41.9

## 2020-05-19 LAB — CBC
HCT: 41.6 % (ref 36.0–46.0)
Hemoglobin: 14 g/dL (ref 12.0–15.0)
MCH: 30.4 pg (ref 26.0–34.0)
MCHC: 33.7 g/dL (ref 30.0–36.0)
MCV: 90.2 fL (ref 80.0–100.0)
Platelets: 254 10*3/uL (ref 150–400)
RBC: 4.61 MIL/uL (ref 3.87–5.11)
RDW: 11.6 % (ref 11.5–15.5)
WBC: 7.9 10*3/uL (ref 4.0–10.5)
nRBC: 0 % (ref 0.0–0.2)

## 2020-05-19 NOTE — Progress Notes (Signed)
COVID Vaccine Completed:No Date COVID Vaccine completed: COVID vaccine manufacturer: [employer]   PCP - Debbrah Alar Cardiologist - no  Chest x-ray - no EKG - no Stress Test - no ECHO - no Cardiac Cath - no Sleep Study -  CPAP -   Fasting Blood Sugar -NA  Checks Blood Sugar _____ times a day  Blood Thinner Instructions:NA Aspirin Instructions: Last Dose:  Anesthesia review:   Patient denies shortness of breath, fever, cough and chest pain at PAT appointment  yes Patient verbalized understanding of instructions that were given to them at the PAT appointment. Patient was also instructed that they will need to review over the PAT instructions again at home before surgery. Yes

## 2020-05-23 ENCOUNTER — Encounter (HOSPITAL_COMMUNITY): Payer: Self-pay | Admitting: Surgery

## 2020-05-24 ENCOUNTER — Other Ambulatory Visit (HOSPITAL_COMMUNITY)
Admission: RE | Admit: 2020-05-24 | Discharge: 2020-05-24 | Disposition: A | Discharge status: Home / Self Care | Payer: BC Managed Care – PPO | Source: Ambulatory Visit | Attending: Surgery | Admitting: Surgery

## 2020-05-24 DIAGNOSIS — Z20822 Contact with and (suspected) exposure to covid-19: Secondary | ICD-10-CM | POA: Diagnosis not present

## 2020-05-24 DIAGNOSIS — Z01812 Encounter for preprocedural laboratory examination: Secondary | ICD-10-CM | POA: Insufficient documentation

## 2020-05-24 LAB — SARS CORONAVIRUS 2 (TAT 6-24 HRS): SARS Coronavirus 2: NEGATIVE

## 2020-05-26 NOTE — Anesthesia Preprocedure Evaluation (Addendum)
Anesthesia Evaluation  Patient identified by MRN, date of birth, ID band Patient awake    Reviewed: Allergy & Precautions, NPO status , Patient's Chart, lab work & pertinent test results  History of Anesthesia Complications Negative for: history of anesthetic complications  Airway Mallampati: II  TM Distance: >3 FB Neck ROM: Full    Dental  (+) Dental Advisory Given   Pulmonary Current SmokerPatient did not abstain from smoking.,  05/24/2020 SARS coronavirus NEG   breath sounds clear to auscultation       Cardiovascular + dysrhythmias (occ PVCs)  Rhythm:Regular Rate:Normal  '16 ECHO: EF 60-65%, mild TR   Neuro/Psych  Headaches, Anxiety Depression    GI/Hepatic Neg liver ROS, GERD  Poorly Controlled,  Endo/Other  Hyperthyroidism   Renal/GU negative Renal ROS     Musculoskeletal   Abdominal   Peds  Hematology negative hematology ROS (+)   Anesthesia Other Findings   Reproductive/Obstetrics OCPs                            Anesthesia Physical Anesthesia Plan  ASA: II  Anesthesia Plan: General   Post-op Pain Management:    Induction: Intravenous and Rapid sequence  PONV Risk Score and Plan: 2 and Ondansetron and Dexamethasone  Airway Management Planned: Oral ETT  Additional Equipment:   Intra-op Plan:   Post-operative Plan: Extubation in OR  Informed Consent: I have reviewed the patients History and Physical, chart, labs and discussed the procedure including the risks, benefits and alternatives for the proposed anesthesia with the patient or authorized representative who has indicated his/her understanding and acceptance.     Dental advisory given  Plan Discussed with: CRNA and Surgeon  Anesthesia Plan Comments:        Anesthesia Quick Evaluation

## 2020-05-27 ENCOUNTER — Encounter (HOSPITAL_COMMUNITY): Payer: Self-pay | Admitting: Surgery

## 2020-05-27 ENCOUNTER — Ambulatory Visit (HOSPITAL_COMMUNITY): Payer: BC Managed Care – PPO | Admitting: Anesthesiology

## 2020-05-27 ENCOUNTER — Encounter (HOSPITAL_COMMUNITY)
Admission: RE | Disposition: A | Discharge status: Home / Self Care | Payer: Self-pay | Source: Home / Self Care | Attending: Surgery

## 2020-05-27 ENCOUNTER — Other Ambulatory Visit: Payer: Self-pay

## 2020-05-27 ENCOUNTER — Ambulatory Visit (HOSPITAL_COMMUNITY)
Admission: RE | Admit: 2020-05-27 | Discharge: 2020-05-28 | Disposition: A | Discharge status: Home / Self Care | Payer: BC Managed Care – PPO | Attending: Surgery | Admitting: Surgery

## 2020-05-27 DIAGNOSIS — E042 Nontoxic multinodular goiter: Secondary | ICD-10-CM | POA: Diagnosis present

## 2020-05-27 DIAGNOSIS — D440 Neoplasm of uncertain behavior of thyroid gland: Secondary | ICD-10-CM | POA: Diagnosis not present

## 2020-05-27 DIAGNOSIS — C73 Malignant neoplasm of thyroid gland: Secondary | ICD-10-CM | POA: Diagnosis not present

## 2020-05-27 DIAGNOSIS — Z8349 Family history of other endocrine, nutritional and metabolic diseases: Secondary | ICD-10-CM | POA: Diagnosis not present

## 2020-05-27 DIAGNOSIS — E0590 Thyrotoxicosis, unspecified without thyrotoxic crisis or storm: Secondary | ICD-10-CM | POA: Diagnosis present

## 2020-05-27 DIAGNOSIS — E890 Postprocedural hypothyroidism: Secondary | ICD-10-CM | POA: Insufficient documentation

## 2020-05-27 DIAGNOSIS — G43909 Migraine, unspecified, not intractable, without status migrainosus: Secondary | ICD-10-CM | POA: Diagnosis not present

## 2020-05-27 DIAGNOSIS — F17200 Nicotine dependence, unspecified, uncomplicated: Secondary | ICD-10-CM | POA: Insufficient documentation

## 2020-05-27 DIAGNOSIS — E0580 Other thyrotoxicosis without thyrotoxic crisis or storm: Secondary | ICD-10-CM | POA: Diagnosis not present

## 2020-05-27 DIAGNOSIS — F419 Anxiety disorder, unspecified: Secondary | ICD-10-CM | POA: Diagnosis not present

## 2020-05-27 HISTORY — PX: THYROID LOBECTOMY: SHX420

## 2020-05-27 LAB — PREGNANCY, URINE: Preg Test, Ur: NEGATIVE

## 2020-05-27 SURGERY — LOBECTOMY, THYROID
Anesthesia: General | Site: Neck | Laterality: Right

## 2020-05-27 MED ORDER — MIDAZOLAM HCL 2 MG/2ML IJ SOLN: 0.5000 mg | Freq: Once | INTRAMUSCULAR | Status: DC | PRN

## 2020-05-27 MED ORDER — ACETAMINOPHEN 500 MG PO TABS
1000.0000 mg | ORAL_TABLET | Freq: Once | ORAL | Status: AC
  Administered 2020-05-27: 1000 mg via ORAL
  Filled 2020-05-27: qty 2

## 2020-05-27 MED ORDER — 0.9 % SODIUM CHLORIDE (POUR BTL) OPTIME
TOPICAL | Status: DC | PRN
  Administered 2020-05-27: 1000 mL

## 2020-05-27 MED ORDER — PROPOFOL 10 MG/ML IV BOLUS
INTRAVENOUS | Status: AC
  Filled 2020-05-27: qty 20

## 2020-05-27 MED ORDER — DEXAMETHASONE SODIUM PHOSPHATE 10 MG/ML IJ SOLN
INTRAMUSCULAR | Status: AC
  Filled 2020-05-27: qty 1

## 2020-05-27 MED ORDER — PROMETHAZINE HCL 25 MG/ML IJ SOLN: 6.2500 mg | INTRAMUSCULAR | Status: DC | PRN

## 2020-05-27 MED ORDER — OXYCODONE HCL 5 MG/5ML PO SOLN: 5.0000 mg | Freq: Once | ORAL | Status: DC | PRN

## 2020-05-27 MED ORDER — MIDAZOLAM HCL 2 MG/2ML IJ SOLN
INTRAMUSCULAR | Status: AC
  Filled 2020-05-27: qty 2

## 2020-05-27 MED ORDER — ACETAMINOPHEN 650 MG RE SUPP: 650.0000 mg | Freq: Four times a day (QID) | RECTAL | Status: DC | PRN

## 2020-05-27 MED ORDER — CHLORHEXIDINE GLUCONATE CLOTH 2 % EX PADS: 6.0000 | MEDICATED_PAD | Freq: Once | CUTANEOUS | Status: DC

## 2020-05-27 MED ORDER — MEPERIDINE HCL 50 MG/ML IJ SOLN: 6.2500 mg | INTRAMUSCULAR | Status: DC | PRN

## 2020-05-27 MED ORDER — ONDANSETRON HCL 4 MG/2ML IJ SOLN
4.0000 mg | Freq: Four times a day (QID) | INTRAMUSCULAR | Status: DC | PRN
  Administered 2020-05-27: 4 mg via INTRAVENOUS
  Filled 2020-05-27: qty 2

## 2020-05-27 MED ORDER — ONDANSETRON 4 MG PO TBDP
4.0000 mg | ORAL_TABLET | Freq: Four times a day (QID) | ORAL | Status: DC | PRN
  Administered 2020-05-27: 4 mg via ORAL
  Filled 2020-05-27: qty 1

## 2020-05-27 MED ORDER — CEFAZOLIN SODIUM-DEXTROSE 2-4 GM/100ML-% IV SOLN
2.0000 g | INTRAVENOUS | Status: AC
  Administered 2020-05-27: 2 g via INTRAVENOUS
  Filled 2020-05-27: qty 100

## 2020-05-27 MED ORDER — OXYCODONE HCL 5 MG PO TABS: 5.0000 mg | ORAL_TABLET | Freq: Once | ORAL | Status: DC | PRN

## 2020-05-27 MED ORDER — PHENYLEPHRINE HCL (PRESSORS) 10 MG/ML IV SOLN
INTRAVENOUS | Status: DC | PRN
Start: 2020-05-27 — End: 2020-05-27
  Administered 2020-05-27: 80 ug via INTRAVENOUS

## 2020-05-27 MED ORDER — PROPOFOL 10 MG/ML IV BOLUS
INTRAVENOUS | Status: DC | PRN
  Administered 2020-05-27: 200 mg via INTRAVENOUS

## 2020-05-27 MED ORDER — SUCCINYLCHOLINE CHLORIDE 200 MG/10ML IV SOSY
PREFILLED_SYRINGE | INTRAVENOUS | Status: DC | PRN
  Administered 2020-05-27: 120 mg via INTRAVENOUS

## 2020-05-27 MED ORDER — PHENYLEPHRINE 40 MCG/ML (10ML) SYRINGE FOR IV PUSH (FOR BLOOD PRESSURE SUPPORT)
PREFILLED_SYRINGE | INTRAVENOUS | Status: AC
  Filled 2020-05-27: qty 10

## 2020-05-27 MED ORDER — ROCURONIUM BROMIDE 10 MG/ML (PF) SYRINGE
PREFILLED_SYRINGE | INTRAVENOUS | Status: DC | PRN
  Administered 2020-05-27: 40 mg via INTRAVENOUS
  Administered 2020-05-27: 10 mg via INTRAVENOUS

## 2020-05-27 MED ORDER — HYDROMORPHONE HCL 1 MG/ML IJ SOLN
0.2500 mg | INTRAMUSCULAR | Status: DC | PRN
  Administered 2020-05-27 (×2): 0.5 mg via INTRAVENOUS

## 2020-05-27 MED ORDER — LIDOCAINE 2% (20 MG/ML) 5 ML SYRINGE
INTRAMUSCULAR | Status: DC | PRN
  Administered 2020-05-27: 100 mg via INTRAVENOUS

## 2020-05-27 MED ORDER — FENTANYL CITRATE (PF) 250 MCG/5ML IJ SOLN
INTRAMUSCULAR | Status: DC | PRN
  Administered 2020-05-27: 50 ug via INTRAVENOUS
  Administered 2020-05-27: 100 ug via INTRAVENOUS
  Administered 2020-05-27 (×2): 50 ug via INTRAVENOUS

## 2020-05-27 MED ORDER — LACTATED RINGERS IV SOLN: INTRAVENOUS | Status: DC

## 2020-05-27 MED ORDER — ONDANSETRON HCL 4 MG/2ML IJ SOLN
INTRAMUSCULAR | Status: DC | PRN
  Administered 2020-05-27: 4 mg via INTRAVENOUS

## 2020-05-27 MED ORDER — HYDROMORPHONE HCL 1 MG/ML IJ SOLN: 1.0000 mg | INTRAMUSCULAR | Status: DC | PRN

## 2020-05-27 MED ORDER — TRAMADOL HCL 50 MG PO TABS
50.0000 mg | ORAL_TABLET | Freq: Four times a day (QID) | ORAL | Status: DC | PRN
  Administered 2020-05-27 – 2020-05-28 (×2): 50 mg via ORAL
  Filled 2020-05-27 (×2): qty 1

## 2020-05-27 MED ORDER — DEXAMETHASONE SODIUM PHOSPHATE 10 MG/ML IJ SOLN
INTRAMUSCULAR | Status: DC | PRN
  Administered 2020-05-27: 10 mg via INTRAVENOUS

## 2020-05-27 MED ORDER — OXYCODONE HCL 5 MG PO TABS
5.0000 mg | ORAL_TABLET | ORAL | Status: DC | PRN
  Administered 2020-05-27: 10 mg via ORAL
  Administered 2020-05-27 – 2020-05-28 (×2): 5 mg via ORAL
  Filled 2020-05-27: qty 1
  Filled 2020-05-27 (×2): qty 2

## 2020-05-27 MED ORDER — MIDAZOLAM HCL 2 MG/2ML IJ SOLN
INTRAMUSCULAR | Status: DC | PRN
  Administered 2020-05-27: 2 mg via INTRAVENOUS

## 2020-05-27 MED ORDER — HYDROMORPHONE HCL 1 MG/ML IJ SOLN
INTRAMUSCULAR | Status: AC
  Administered 2020-05-27: 0.5 mg via INTRAVENOUS
  Filled 2020-05-27: qty 1

## 2020-05-27 MED ORDER — ACETAMINOPHEN 325 MG PO TABS: 650.0000 mg | ORAL_TABLET | Freq: Four times a day (QID) | ORAL | Status: DC | PRN

## 2020-05-27 MED ORDER — HYDROMORPHONE HCL 1 MG/ML IJ SOLN
INTRAMUSCULAR | Status: AC
  Filled 2020-05-27: qty 1

## 2020-05-27 MED ORDER — ORAL CARE MOUTH RINSE: 15.0000 mL | Freq: Once | OROMUCOSAL | Status: AC

## 2020-05-27 MED ORDER — SODIUM CHLORIDE 0.45 % IV SOLN: INTRAVENOUS | Status: DC

## 2020-05-27 MED ORDER — LIDOCAINE 2% (20 MG/ML) 5 ML SYRINGE
INTRAMUSCULAR | Status: AC
  Filled 2020-05-27: qty 5

## 2020-05-27 MED ORDER — ROCURONIUM BROMIDE 10 MG/ML (PF) SYRINGE
PREFILLED_SYRINGE | INTRAVENOUS | Status: AC
  Filled 2020-05-27: qty 10

## 2020-05-27 MED ORDER — ONDANSETRON HCL 4 MG/2ML IJ SOLN
INTRAMUSCULAR | Status: AC
  Filled 2020-05-27: qty 2

## 2020-05-27 MED ORDER — SUGAMMADEX SODIUM 200 MG/2ML IV SOLN
INTRAVENOUS | Status: DC | PRN
  Administered 2020-05-27: 200 mg via INTRAVENOUS

## 2020-05-27 MED ORDER — FENTANYL CITRATE (PF) 250 MCG/5ML IJ SOLN
INTRAMUSCULAR | Status: AC
  Filled 2020-05-27: qty 5

## 2020-05-27 MED ORDER — CHLORHEXIDINE GLUCONATE 0.12 % MT SOLN
15.0000 mL | Freq: Once | OROMUCOSAL | Status: AC
  Administered 2020-05-27: 15 mL via OROMUCOSAL

## 2020-05-27 SURGICAL SUPPLY — 29 items
ATTRACTOMAT 16X20 MAGNETIC DRP (DRAPES) ×3 IMPLANT
BLADE SURG 15 STRL LF DISP TIS (BLADE) ×1 IMPLANT
BLADE SURG 15 STRL SS (BLADE) ×3
CHLORAPREP W/TINT 26 (MISCELLANEOUS) ×3 IMPLANT
CLIP VESOCCLUDE MED 6/CT (CLIP) ×12 IMPLANT
CLIP VESOCCLUDE SM WIDE 6/CT (CLIP) ×6 IMPLANT
COVER SURGICAL LIGHT HANDLE (MISCELLANEOUS) ×3 IMPLANT
COVER WAND RF STERILE (DRAPES) ×3 IMPLANT
DERMABOND ADVANCED (GAUZE/BANDAGES/DRESSINGS) ×2
DERMABOND ADVANCED .7 DNX12 (GAUZE/BANDAGES/DRESSINGS) ×1 IMPLANT
DRAPE LAPAROTOMY T 98X78 PEDS (DRAPES) ×3 IMPLANT
ELECT PENCIL ROCKER SW 15FT (MISCELLANEOUS) ×3 IMPLANT
ELECT REM PT RETURN 15FT ADLT (MISCELLANEOUS) ×3 IMPLANT
GAUZE 4X4 16PLY RFD (DISPOSABLE) ×3 IMPLANT
GLOVE SURG ORTHO 8.0 STRL STRW (GLOVE) ×3 IMPLANT
GOWN STRL REUS W/TWL XL LVL3 (GOWN DISPOSABLE) ×6 IMPLANT
HEMOSTAT SURGICEL 2X4 FIBR (HEMOSTASIS) ×3 IMPLANT
ILLUMINATOR WAVEGUIDE N/F (MISCELLANEOUS) ×3 IMPLANT
KIT BASIN (CUSTOM PROCEDURE TRAY) ×3 IMPLANT
KIT TURNOVER KIT A (KITS) IMPLANT
PACK BASIC VI WITH GOWN DISP (CUSTOM PROCEDURE TRAY) ×3 IMPLANT
SHEARS HARMONIC 9CM CVD (BLADE) ×3 IMPLANT
SUT MNCRL AB 4-0 PS2 18 (SUTURE) ×3 IMPLANT
SUT VIC AB 3-0 SH 18 (SUTURE) ×6 IMPLANT
SYR BULB IRRIG 60ML STRL (SYRINGE) ×3 IMPLANT
TOWEL OR 17X26 10 PK STRL BLUE (TOWEL DISPOSABLE) ×3 IMPLANT
TOWEL OR NON WOVEN STRL DISP B (DISPOSABLE) ×3 IMPLANT
TUBING CONNECTING 10 (TUBING) ×2 IMPLANT
TUBING CONNECTING 10' (TUBING) ×1

## 2020-05-27 NOTE — Anesthesia Procedure Notes (Signed)
Procedure Name: Intubation Date/Time: 05/27/2020 7:26 AM Performed by: Sharlette Dense, CRNA Patient Re-evaluated:Patient Re-evaluated prior to induction Oxygen Delivery Method: Circle system utilized Preoxygenation: Pre-oxygenation with 100% oxygen Induction Type: IV induction, Rapid sequence and Cricoid Pressure applied Laryngoscope Size: Miller and 2 Grade View: Grade I Tube type: Reinforced Tube size: 7.0 mm Number of attempts: 1 Airway Equipment and Method: Stylet Placement Confirmation: ETT inserted through vocal cords under direct vision,  positive ETCO2 and breath sounds checked- equal and bilateral Secured at: 21 cm Tube secured with: Tape Dental Injury: Teeth and Oropharynx as per pre-operative assessment

## 2020-05-27 NOTE — Transfer of Care (Signed)
Immediate Anesthesia Transfer of Care Note  Patient: Jenna [name]  Procedure(s) Performed: RIGHT THYROID LOBECTOMY (Right Neck)  Patient Location: PACU  Anesthesia Type:General  Level of Consciousness: awake, alert  and oriented  Airway & Oxygen Therapy: Patient Spontanous Breathing and Patient connected to face mask oxygen  Post-op Assessment: Report given to RN and Post -op Vital signs reviewed and stable  Post vital signs: Reviewed and stable  Last Vitals:  Vitals Value Taken Time  BP    Temp    Pulse 110 05/27/20 0858  Resp 15 05/27/20 0858  SpO2 100 % 05/27/20 0858  Vitals shown include unvalidated device data.  Last Pain:  Vitals:   05/27/20 0612  TempSrc:   PainSc: 0-No pain      Patients Stated Pain Goal: 4 (43/88/87 5797)  Complications: No complications documented.

## 2020-05-27 NOTE — Op Note (Signed)
Procedure Note  Pre-operative Diagnosis:  Thyroid neoplasm of uncertain behavior, hyperthyroidism  Post-operative Diagnosis:  same  Surgeon:  Armandina Gemma, MD  Assistant:  none   Procedure:  Right thyroid lobectomy and isthmusectomy  Anesthesia:  General  Estimated Blood Loss:  minimal  Drains: none         Specimen: thyroid lobe to pathology  Indications:  Patient is referred by Dr. Philemon Kingdom for surgical evaluation and management of multiple thyroid nodules and thyroid neoplasm of uncertain behavior. Patient was diagnosed by her primary care provider with right-sided thyroid nodules. She underwent evaluation with ultrasound showing a dominant nodule in the inferior right thyroid lobe. Fine-needle aspiration biopsy and molecular genetic testing found this to be a benign nodule. Patient however has had progressive decrease in her TSH level. Nuclear medicine scan shows that the dominant nodule is a "hot nodule" and her TSH level has been at the low end of normal. Follow-up ultrasound demonstrated slight increase in size of the dominant nodule as well as a new 1 cm nodule in the lower right thyroid lobe which was felt to be highly suspicious for malignancy. We reviewed this ultrasound report today in detail and I provided the patient with a copy. Fine-needle aspiration showed cytologic atypia, Bethesda category III. AFIRMA testing returned as suspicious, rendering a risk of malignancy of 50%. Patient is now referred for consideration for right thyroid lobectomy for definitive diagnosis of the small thyroid nodule and for management of the "hot nodule" in the inferior right thyroid lobe. The patient has had no prior head or neck surgery. She has never been on thyroid medication or anti-thyroid medication. She has never been on beta blockade. There is a family history of thyroid nodules and the patient's mother. There is a history of pituitary tumor and the patient's mother and  grandmother. There is a history of adrenal tumor in the patient's maternal aunt. There is no family history of thyroid cancer. Patient presents today to discuss thyroid surgery. She is accompanied by her mother.  Procedure Details: Procedure was done in OR #1 at the [facility]. The patient was brought to the operating room and placed in a supine position on the operating room table. Following administration of general anesthesia, the patient was positioned and then prepped and draped in the usual aseptic fashion. After ascertaining that an adequate level of anesthesia had been achieved, a small Kocher incision was made with #15 blade. Dissection was carried through subcutaneous tissues and platysma. Hemostasis was achieved with the electrocautery. Skin flaps were elevated cephalad and caudad from the thyroid notch to the sternal notch. A self-retaining retractor was placed for exposure. Strap muscles were incised in the midline and dissection was begun on the right side. Strap muscles were reflected laterally. The right thyroid lobe was moderately enlarged with palpable nodules. The lobe was gently mobilized with blunt dissection. Superior pole vessels were dissected out and divided individually between small and medium ligaclips with the harmonic scalpel. The thyroid lobe was rolled anteriorly. Branches of the inferior thyroid artery were divided between small ligaclips with the harmonic scalpel. Inferior venous tributaries were divided between ligaclips. Both the superior and inferior parathyroid glands were identified and preserved on their vascular pedicles. The recurrent laryngeal nerve was identified and preserved along its course. The ligament of Gwenlyn Found was released with the electrocautery and the gland was mobilized onto the anterior trachea. Isthmus was mobilized across the midline. There was a small pyramidal lobe present which was  resected with the isthmus. The thyroid parenchyma was  transected at the junction of the isthmus and contralateral thyroid lobe with the harmonic scalpel. The thyroid lobe and isthmus were submitted to pathology for review.  The neck was irrigated with warm saline. Fibrillar was placed throughout the operative field. Strap muscles were approximated in the midline with interrupted 3-0 Vicryl sutures. Platysma was closed with interrupted 3-0 Vicryl sutures. Skin was closed with a running 4-0 Monocryl subcuticular suture.  Wound was washed and dried and Dermabond was applied. The patient was awakened from anesthesia and brought to the recovery room. The patient tolerated the procedure well.   Armandina Gemma, MD [facility] Office: [phone]

## 2020-05-27 NOTE — Anesthesia Postprocedure Evaluation (Signed)
Anesthesia Post Note  Patient: Jenna [name]  Procedure(s) Performed: RIGHT THYROID LOBECTOMY (Right Neck)     Patient location during evaluation: PACU Anesthesia Type: General Level of consciousness: awake and alert, oriented and patient cooperative Pain management: pain level controlled Vital Signs Assessment: post-procedure vital signs reviewed and stable Respiratory status: spontaneous breathing, nonlabored ventilation and respiratory function stable Cardiovascular status: blood pressure returned to baseline and stable Postop Assessment: no apparent nausea or vomiting Anesthetic complications: no   No complications documented.  Last Vitals:  Vitals:   05/27/20 1030 05/27/20 1048  BP: (!) 118/56 120/79  Pulse: 64 73  Resp: 17 18  Temp:  37.2 C  SpO2: 96% 99%    Last Pain:  Vitals:   05/27/20 1048  TempSrc: Oral  PainSc: 4                  Coty Student,E. Tanis Burnley

## 2020-05-27 NOTE — Interval H&P Note (Signed)
History and Physical Interval Note:  05/27/2020 7:00 AM  Jenna [name]  has presented today for surgery, with the diagnosis of THYROID NEOPLASM OF UNCERTAIN BEHAVIOR.  The various methods of treatment have been discussed with the patient and family. After consideration of risks, benefits and other options for treatment, the patient has consented to    Procedure(s): RIGHT THYROID LOBECTOMY (Right) as a surgical intervention.    The patient's history has been reviewed, patient examined, no change in status, stable for surgery.  I have reviewed the patient's chart and labs.  Questions were answered to the patient's satisfaction.    Armandina Gemma, MD [facility] Office: [place]

## 2020-05-28 ENCOUNTER — Encounter (HOSPITAL_COMMUNITY): Payer: Self-pay | Admitting: Surgery

## 2020-05-28 DIAGNOSIS — Z8349 Family history of other endocrine, nutritional and metabolic diseases: Secondary | ICD-10-CM | POA: Diagnosis not present

## 2020-05-28 DIAGNOSIS — F17200 Nicotine dependence, unspecified, uncomplicated: Secondary | ICD-10-CM | POA: Diagnosis not present

## 2020-05-28 DIAGNOSIS — C73 Malignant neoplasm of thyroid gland: Secondary | ICD-10-CM | POA: Diagnosis not present

## 2020-05-28 DIAGNOSIS — D440 Neoplasm of uncertain behavior of thyroid gland: Secondary | ICD-10-CM | POA: Diagnosis not present

## 2020-05-28 MED ORDER — TRAMADOL HCL 50 MG PO TABS: 50.0000 mg | ORAL_TABLET | Freq: Four times a day (QID) | ORAL | 0 refills | Status: DC | PRN

## 2020-05-28 NOTE — Progress Notes (Signed)
D/C instructions given to patient. Patient had no questions. NT or write will wheel patient out once family comes in

## 2020-05-28 NOTE — Discharge Summary (Signed)
Physician Discharge Summary [facility]  Patient ID: Jenna [name] MRN: [id number] DOB/AGE: July 24, 1991 29 y.o.  Admit date: 05/27/2020 Discharge date: 05/28/2020  Admission Diagnoses:  Toxic thyroid nodule  Discharge Diagnoses:  Principal Problem:   Neoplasm of uncertain behavior of thyroid gland Active Problems:   Multiple thyroid nodules   Hyperthyroidism, subclinical   Discharged Condition: good  Hospital Course: Patient was admitted for observation following thyroid surgery.  Post op course was uncomplicated.  Pain was well controlled.  Tolerated diet.  Patient was prepared for discharge home on POD#1.  Consults: None  Treatments: surgery: thyroid lobectomy  Discharge Exam: Blood pressure 128/81, pulse 77, temperature 98.1 F (36.7 C), temperature source Oral, resp. rate 18, height 5\' 3"  (1.6 m), weight 76.3 kg, last menstrual period 05/24/2020, SpO2 98 %. HEENT - clear Neck - wound dry and intact; mild STS; voice normal Chest - clear bilaterally Cor - RRR   Disposition: Home  Discharge Instructions    Diet - low sodium heart healthy   Complete by: As directed    Discharge instructions   Complete by: As directed    [facility]  THYROID & PARATHYROID SURGERY:  POST-OP INSTRUCTIONS  Always review your discharge instruction sheet from the facility where your surgery was performed.  A prescription for pain medication may be given to you upon discharge.  Take your pain medication as prescribed.  If narcotic pain medicine is not needed, then you may take acetaminophen (Tylenol) or ibuprofen (Advil) as needed.  Take your usually prescribed medications unless otherwise directed.  If you need a refill on your pain medication, please contact our office during regular business hours.  Prescriptions cannot be processed by our office after 5 pm or on weekends.  Start with a light diet upon arrival home, such as soup and crackers or  toast.  Be sure to drink plenty of fluids daily.  Resume your normal diet the day after surgery.  Most patients will experience some swelling and bruising on the chest and neck area.  Ice packs will help.  Swelling and bruising can take several days to resolve.   It is common to experience some constipation after surgery.  Increasing fluid intake and taking a stool softener (Colace) will usually help or prevent this problem.  A mild laxative (Milk of Magnesia or Miralax) should be taken according to package directions if there has been no bowel movement after 48 hours.  You have steri-strips and a gauze dressing over your incision.  You may remove the gauze bandage on the second day after surgery, and you may shower at that time.  Leave your steri-strips (small skin tapes) in place directly over the incision.  These strips should remain on the skin for 5-7 days and then be removed.  You may get them wet in the shower and pat them dry.  You may resume regular (light) daily activities beginning the next day (such as daily self-care, walking, climbing stairs) gradually increasing activities as tolerated.  You may have sexual intercourse when it is comfortable.  Refrain from any heavy lifting or straining until approved by your doctor.  You may drive when you no longer are taking prescription pain medication, you can comfortably wear a seatbelt, and you can safely maneuver your car and apply brakes.  You should see your doctor in the office for a follow-up appointment approximately three weeks after your surgery.  Make sure that you call for this  appointment within a day or two after you arrive home to insure a convenient appointment time.  WHEN TO CALL YOUR DOCTOR: -- Fever greater than 101.5 -- Inability to urinate -- Nausea and/or vomiting - persistent -- Extreme swelling or bruising -- Continued bleeding from incision -- Increased pain, redness, or drainage from the incision -- Difficulty  swallowing or breathing -- Muscle cramping or spasms -- Numbness or tingling in hands or around lips  The clinic staff is available to answer your questions during regular business hours.  Please don't hesitate to call and ask to speak to one of the nurses if you have concerns.  Armandina Gemma, MD [facility] Office: [phone]   Increase activity slowly   Complete by: As directed    No dressing needed   Complete by: As directed      Allergies as of 05/28/2020      Reactions   Adhesive [tape] Itching, Rash   Blistering of skin       Medication List    TAKE these medications   cephALEXin 500 MG capsule Commonly known as: KEFLEX Take 1 capsule (500 mg total) by mouth 3 (three) times daily.   norgestimate-ethinyl estradiol 0.25-35 MG-MCG tablet Commonly known as: Sprintec 28 Take 1 tablet by mouth daily.   traMADol 50 MG tablet Commonly known as: ULTRAM Take 1-2 tablets (50-100 mg total) by mouth every 6 (six) hours as needed.   valACYclovir 1000 MG tablet Commonly known as: VALTREX TAKE 2 TABLETS BY MOUTH NOW AND  AGAIN  IN  12  HOURS            Discharge Care Instructions  (From admission, onward)         Start     Ordered   05/28/20 0000  No dressing needed        05/28/20 1004          Follow-up Information    Armandina Gemma, MD. Schedule an appointment as soon as possible for a visit in 3 week(s).   Specialty: General Surgery Contact information: [address] [place] [address] [phone]               Earnstine Regal, MD, [facility] Office: [phone]   Signed: Armandina Gemma 05/28/2020, 10:05 AM

## 2020-05-31 LAB — SURGICAL PATHOLOGY

## 2020-06-04 ENCOUNTER — Encounter: Payer: Self-pay | Admitting: Family

## 2020-06-07 MED ORDER — IBUPROFEN 600 MG PO TABS: 600.0000 mg | ORAL_TABLET | Freq: Four times a day (QID) | ORAL | 0 refills | Status: DC | PRN

## 2020-06-17 ENCOUNTER — Ambulatory Visit (INDEPENDENT_AMBULATORY_CARE_PROVIDER_SITE_OTHER): Payer: BC Managed Care – PPO | Admitting: Internal Medicine

## 2020-06-17 ENCOUNTER — Encounter: Payer: Self-pay | Admitting: Internal Medicine

## 2020-06-17 ENCOUNTER — Other Ambulatory Visit: Payer: Self-pay

## 2020-06-17 VITALS — BP 124/60 | HR 112 | Ht 64.0 in | Wt 169.0 lb

## 2020-06-17 DIAGNOSIS — C73 Malignant neoplasm of thyroid gland: Secondary | ICD-10-CM

## 2020-06-17 DIAGNOSIS — Z9889 Other specified postprocedural states: Secondary | ICD-10-CM | POA: Diagnosis not present

## 2020-06-17 DIAGNOSIS — E0590 Thyrotoxicosis, unspecified without thyrotoxic crisis or storm: Secondary | ICD-10-CM

## 2020-06-17 LAB — T4, FREE: Free T4: 0.7 ng/dL (ref 0.60–1.60)

## 2020-06-17 LAB — T3, FREE: T3, Free: 3.4 pg/mL (ref 2.3–4.2)

## 2020-06-17 LAB — TSH: TSH: 3.78 u[IU]/mL (ref 0.35–4.50)

## 2020-06-17 NOTE — Patient Instructions (Signed)
Please stop at the lab.  Please come back for a follow-up appointment in 6 months.  

## 2020-06-17 NOTE — Progress Notes (Signed)
Patient ID: Jenna [name], female   DOB: 1991/03/06, 29 y.o.   MRN: [id number]   This visit occurred during the SARS-CoV-2 public health emergency.  Safety protocols were in place, including screening questions prior to the visit, additional usage of staff PPE, and extensive cleaning of exam room while observing appropriate contact time as indicated for disinfecting solutions.   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  Last visit 7 months ago.  Reviewed and addended history: Her nodule was found at the time of APE with her PCP.  At that time, PCP noted that she had a lump in her neck and sent her for a thyroid ultrasound.  The ultrasound showed a large right thyroid nodule and a complex right smaller nodule (see below).  Thyroid U/S (10/29/2018): Right inferior 2.1 x 1 x 1.3 cm solid, hypoechoic, thyroid nodule       12/03/2018: FNA:  Adequacy Reason Satisfactory For Evaluation. 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   Afirma molecular marker: Benign  12/01/2019: Thyroid U/S: CLINICAL DATA:  Prior ultrasound follow-up. History of right-sided thyroid nodule fine-needle aspiration performed 12/03/2018.  Parenchymal Echotexture: Mildly heterogenous Isthmus: Normal in size measures 0.3 cm in diameter Right lobe: Borderline enlarged measuring 6.8 x 1.4 x 2.0 cm, unchanged, previously, 6.7 x 1.9 x 2.0 cm Left lobe: Normal in size measuring 5.6 x 1.4 x 1.7 cm, unchanged previously, 5.0 x 1.4 x 1.7 cm _________________________________________________________  The previously biopsied nodule within the inferior pole of the right lobe of the thyroid (labeled 1) is unchanged to slightly increased in size compared to the  10/2018 examination, currently measuring 2.4 x 1.7 x 1.2 cm, previously, 2.1 x 1.3 x 1.0 cm. Correlation with previous biopsy results is advised. _________________________________________________________  Nodule # 2: Prior biopsy: No Location: Right; Inferior Maximum size: 1.0 cm; Other 2 dimensions: 0.9 x 0.7 cm, previously, 0.9 x 0.8 x 0.7 cm Composition: solid/almost completely solid (2) Echogenicity: hypoechoic (2) Echogenic foci: punctate echogenic foci (3) ACR TI-RADS total points: 7. Change in features: Yes nodule now appears to contain punctate echogenic foci Change in ACR TI-RADS risk category: Yes previously characterized as a TR4 nodule, currently a TR5 nodule ACR TI-RADS recommendations:  **Given size (>/= 1.0 cm) and appearance, fine needle aspiration of this highly suspicious nodule should be considered based on TI-RADS criteria.  _________________________________________________________  There is an approximately 0.6 x 0.5 x 0.5 cm well-defined hypoechoic nodule within the mid aspect the right lobe of the thyroid which is unchanged in hind site compared to the 11/2018 examination and again does not meet imaging criteria to recommend percutaneous sampling or continued dedicated follow-up.  There is a punctate (approximately 0.2 cm) hypoechoic nodule within the left lobe of the thyroid which is unchanged in hind site compared to the 11/2018 examination and again does not meet imaging criteria to recommend percutaneous sampling or continued dedicated follow-up.  IMPRESSION: 1. Findings suggestive of multinodular goiter. No definitive new worrisome thyroid nodules. 2. Nodule #2 now appears to contain punctate echogenic foci and as such has been up graded from a TR4 nodule to a TR5 nodule and now meets imaging criteria to recommend percutaneous sampling as clinically indicated. 3. Previously biopsied nodule within the inferior pole of the right lobe of  the  thyroid is unchanged to slightly increased in size compared to the 10/2018 examination, currently measuring 2.4 cm, previously, 2.1 cm. Correlation with previous biopsy results is advised. Regardless of slight increase in size, assuming a benign pathologic diagnosis, repeat sampling and/or continued dedicated follow-up is not recommended.  The above is in keeping with the ACR TI-RADS recommendations - J Am Coll Radiol 2017;14:587-595.  Electronically Signed   By: Sandi Mariscal M.D.   On: 12/01/2019 11:58  12/02/2019: Thyroid Uptake and scan: Hot nodule at inferior pole of RIGHT thyroid lobe with mild suppression of uptake throughout remaining thyroid tissue. No additional areas of increased or decreased tracer localization.  4 hour I-123 uptake = 14.5% (normal 5-20%)  24 hour I-123 uptake = 22.6% (normal 10-30%)  IMPRESSION: Normal 4 hour and 24 hour radio iodine uptakes.  Hot nodule at inferior pole RIGHT thyroid lobe with mild suppression of uptake of tracer within remaining thyroid tissue consistent with hyperfunctional RIGHT inferior thyroid adenoma.  Electronically Signed   By: Lavonia Dana M.D.   On: 12/02/2019 12:04  01/27/2020: FNA of the 1 x 0.9 x 0.7 cm right inferior nodule: FLUS (Bethesda category 3)  Afirma: Suspicious  05/27/2020: Right thyroid lobectomy with isthmusectomy by Dr. Harlow Asa: FINAL MICROSCOPIC DIAGNOSIS:   A. 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  - No extrathyroidal extension identified.  - Margins of resection are not involved.  - See oncology table.   Procedure: Right hemithyroidectomy  Tumor Focality: Multifocal  Tumor Site: Right lobe  Tumor Size: Greatest dimension: 0.6 cm  Histologic Type: Papillary carcinoma  Margins: Uninvolved by tumor  Angioinvasion: Not identified  Lymphatic Invasion: Not identified  Extrathyroidal Extension: Not  identified  Regional Lymph Nodes: No lymph nodes submitted or found  Pathologic Stage Classification (pTNM, AJCC 8th Edition): mpT1a, pNX  Representative Tumor Block: A6  Comment: Dr. Tresa Moore reviewed the case.   Pt denies: - feeling nodules in neck - hoarseness - dysphagia - choking - SOB with lying down  Patient was not started on levothyroxine after her surgery.  Reviewed TFTs: Lab Results  Component Value Date   TSH 0.39 11/11/2019   TSH 0.43 10/11/2018   TSH 0.49 07/27/2017   TSH 0.50 12/05/2016   TSH 0.96 11/29/2015   TSH 1.29 07/08/2015   TSH 0.895 08/10/2011   FREET4 0.83 11/11/2019    + FH of thyroid ds in mother: multiple thyroid nodules - benign, MGGM - also thyroid nodule. No FH of thyroid cancer. No h/o radiation tx to head or neck.  No herbal supplements. No Biotin use. No recent steroids use.   Pt also has a history of SVT (with chronic palpitations)- on beta blocker, heart murmur, HL.  ROS: Constitutional: no weight gain/no weight loss, no fatigue, no subjective hyperthermia, no subjective hypothermia Eyes: no blurry vision, no xerophthalmia ENT: no sore throat, + see HPI Cardiovascular: no CP/no SOB/+ palpitations/no leg swelling Respiratory: no cough/no SOB/no wheezing Gastrointestinal: no N/no V/no D/no C/no acid reflux Musculoskeletal: no muscle aches/no joint aches Skin: no rashes, no hair loss Neurological: no tremors/no numbness/no tingling/no dizziness  I reviewed pt's medications, allergies, PMH, social hx, family hx, and changes were documented in the history of present illness. Otherwise, unchanged from my initial visit note.  Past Medical History:  Diagnosis Date  . Anxiety   . Chronic diarrhea   . Cyst of ovary 12/2017  . Depression   . Dysrhythmia  PVCs  . Headache    stress related  . Heart murmur    since childhood  . Rapid palpitations 07/08/2015  . Vasovagal syncope 2011   no since then   Past Surgical History:  Procedure  Laterality Date  . THYROID LOBECTOMY Right 05/27/2020   Procedure: RIGHT THYROID LOBECTOMY;  Surgeon: Armandina Gemma, MD;  Location: [facility];  Service: General;  Laterality: Right;  . WISDOM TOOTH EXTRACTION     Social History   Socioeconomic History  . Marital status: Single    Spouse name: Not on file  . Number of children: 0  . Years of education: Not on file  . Highest education level: Not on file  Occupational History  .  Maintenance manager  Social Needs  . Financial resource strain: Not on file  . Food insecurity:    Worry: Not on file    Inability: Not on file  . Transportation needs:    Medical: Not on file    Non-medical: Not on file  Tobacco Use  . Smoking status: Current Every Day Smoker    Packs/day: 1.00    Types: Cigarettes  . Smokeless tobacco: Never Used  . Tobacco comment: 7 cigarettes daily  Substance and Sexual Activity  . Alcohol use: Yes    Alcohol/week:     Types:     Comment: Rarely, beer, 3 drinks  . Drug use: No  Lifestyle  . Physical activity:    Days per week: Not on file    Minutes per session: Not on file  . Stress: Not on file  Social History Narrative   Lives in [place] with her mother, Clinton Wahlberg, and her brother, Lamount Cranker.  Identifies as [place]. Endorses tobacco use as well as rare THC use. Denies ETOH use. + sexually active. Has implanon.     GED   Installs pools   Current Outpatient Medications on File Prior to Visit  Medication Sig Dispense Refill  . cephALEXin (KEFLEX) 500 MG capsule Take 1 capsule (500 mg total) by mouth 3 (three) times daily. 21 capsule 0  . ibuprofen (ADVIL) 600 MG tablet Take 1 tablet (600 mg total) by mouth every 6 (six) hours as needed. 20 tablet 0  . norgestimate-ethinyl estradiol (SPRINTEC 28) 0.25-35 MG-MCG tablet Take 1 tablet by mouth daily. 1 Package 11  . traMADol (ULTRAM) 50 MG tablet Take 1-2 tablets (50-100 mg total) by mouth every 6 (six) hours as needed. 15 tablet 0  .  valACYclovir (VALTREX) 1000 MG tablet TAKE 2 TABLETS BY MOUTH NOW AND  AGAIN  IN  12  HOURS (Patient not taking: Reported on 05/10/2020) 4 tablet 0   No current facility-administered medications on file prior to visit.   Allergies  Allergen Reactions  . Adhesive [Tape] Itching and Rash    Blistering of skin     Family History  Problem Relation Age of Onset  . Arrhythmia Maternal Grandmother   . Hypertension Maternal Grandfather   . Diabetes Maternal Grandfather   . Heart attack Maternal Grandfather   . Heart disease Maternal Grandfather   . Heart murmur Mother   . Melanoma Mother        stage 3 malignant melanoma  . Healthy Father   . Healthy Brother   . Scoliosis Brother   . Other Maternal Aunt        TACHYCARDIA  . Diabetes Paternal Grandfather   . Arrhythmia Other        Aunt  w/ tachyarrythmia at age 48  . Cancer Other     PE: LMP 05/24/2020 (Exact Date)  Wt Readings from Last 3 Encounters:  05/27/20 168 lb 4 oz (76.3 kg)  05/19/20 168 lb 4 oz (76.3 kg)  05/03/20 165 lb (74.8 kg)   Constitutional: Slightly overweight, in NAD Eyes: PERRLA, EOMI, no exophthalmos ENT: moist mucous membranes, no neck masses palpated, thyroidectomy scar healing, with a littleerythema, no pain, minimal keloid, no cervical lymphadenopathy Cardiovascular: RRR, No MRG Respiratory: CTA B Gastrointestinal: abdomen soft, NT, ND, BS+ Musculoskeletal: no deformities, strength intact in all 4 Skin: moist, warm, no rashes Neurological: no tremor with outstretched hands, DTR normal in all 4  ASSESSMENT: 1.  Papillary thyroid cancer -Multifocal, subcentimeter  2. S/p right thyroid lobectomy  3. Subclinical hyperthyroidism  PLAN: 1.  Papillary thyroid cancer -Reviewed the images of her previous thyroid ultrasounds and also previous investigation and treatment.  She has a history of thyroid nodules, of which the dominant nodule was fairly large, hypoechoic, but without other worrisome  characteristics.  Moreover, this nodule appeared to be hot on a thyroid uptake and scan.  However, on her ultrasound from 12/01/2019, she also had a small, right inferior thyroid nodule measuring 1 x 0.9 x 0.7 cm, which was solid, hypoechoic, and with microcalcifications and this nodule was recommended to be biopsied.  A biopsy was inconclusive on 01/27/2020 1 the Afirma molecular marker returned suspicious.  Therefore, I referred her to surgery for hemithyroidectomy.  Final pathology showed papillary thyroid cancer, multifocal, with the largest focus of 0.6 cm, but without lymphovascular extension or other worrisome characteristics. -At this visit, she returns for discussion about further plan.  I explained that her cancer is still very low risk (stage I) since she is so young, and the cancer was very small and without any apparent worrisome characteristics.  Therefore, I would not suggest completion thyroidectomy and RAI treatment.  We will continue to follow her by neck ultrasounds.  She agrees with the plan.  - thyroglobulin is not indicated anymore in follow-up for patients after hemithyroidectomy due to possible variability. -At today's visit, we will check her TFTs to see if she needs levothyroxine -Otherwise, I will see her back in 6 months, but possibly sooner for labs.  2.  S/p right thyroid lobectomy -Her surgical scar is healing well, but she does have a keloid and I advised her to try kelo-cote strips -No keloid formation, pain, or dysesthesia at the site of the surgery -We will recheck her TFTs today to see if she needs LT4 supplementation -We discussed that approximately 25% of patients may require LT4 after hemithyroidectomy  3. Subclinical hyperthyroidism -I am hoping that this is resolved now after her hemithyroidectomy -We will recheck TFTs today  Office Visit on 06/17/2020  Component Date Value Ref Range Status  . TSH 06/17/2020 3.78  0.35 - 4.50 uIU/mL Final  . Free T4  06/17/2020 0.70  0.60 - 1.60 ng/dL Final   Comment: Specimens from patients who are undergoing biotin therapy and /or ingesting biotin supplements may contain high levels of biotin.  The higher biotin concentration in these specimens interferes with this Free T4 assay.  Specimens that contain high levels  of biotin may cause false high results for this Free T4 assay.  Please interpret results in light of the total clinical presentation of the patient.    . T3, Free 06/17/2020 3.4  2.3 - 4.2 pg/mL Final   Tthyroid tests are normal.  We will need to repeat them in ~1.5 mo, but no intervention is needed for now.  Philemon Kingdom, MD PhD [facility] Endocrinology

## 2020-07-15 ENCOUNTER — Encounter: Payer: Self-pay | Admitting: Obstetrics & Gynecology

## 2020-07-15 ENCOUNTER — Other Ambulatory Visit: Payer: Self-pay

## 2020-07-15 ENCOUNTER — Ambulatory Visit (INDEPENDENT_AMBULATORY_CARE_PROVIDER_SITE_OTHER): Payer: BC Managed Care – PPO | Admitting: Obstetrics & Gynecology

## 2020-07-15 ENCOUNTER — Encounter: Payer: BC Managed Care – PPO | Admitting: Obstetrics & Gynecology

## 2020-07-15 ENCOUNTER — Other Ambulatory Visit (HOSPITAL_COMMUNITY)
Admission: RE | Admit: 2020-07-15 | Discharge: 2020-07-15 | Disposition: A | Discharge status: Home / Self Care | Payer: BC Managed Care – PPO | Source: Ambulatory Visit | Attending: Obstetrics & Gynecology | Admitting: Obstetrics & Gynecology

## 2020-07-15 VITALS — BP 120/66 | HR 74 | Ht 64.0 in | Wt 172.0 lb

## 2020-07-15 DIAGNOSIS — Z01419 Encounter for gynecological examination (general) (routine) without abnormal findings: Secondary | ICD-10-CM | POA: Diagnosis not present

## 2020-07-15 DIAGNOSIS — Z3009 Encounter for other general counseling and advice on contraception: Secondary | ICD-10-CM | POA: Diagnosis not present

## 2020-07-15 NOTE — Progress Notes (Signed)
Subjective:     Jenna [name] is a 29 y.o. female here for a routine exam. G1P0010 monthly cycles on OCPs. Pt is sexually active with one female partner.   Current complaints: Pt has a h/o an ovarian cyst. She had sx 2 days ago and wonders if she has a cyst. No sx currently.    Gynecologic History Patient's last menstrual period was 06/23/2020. Contraception: OCP (estrogen/progesterone) Last Pap: 07/2017. Results were: normal Last mammogram: n/a.   Obstetric History OB History  Gravida Para Term Preterm AB Living  1       1    SAB TAB Ectopic Multiple Live Births  1            # Outcome Date GA Lbr Len/2nd Weight Sex Delivery Anes PTL Lv  1 SAB 2018           The following portions of the patient's history were reviewed and updated as appropriate: allergies, current medications, past family history, past medical history, past social history, past surgical history and problem list.  Review of Systems Pertinent items are noted in HPI.    Objective:  BP 120/66   Pulse 74   Ht 5\' 4"  (1.626 m)   Wt 172 lb (78 kg)   LMP 06/23/2020   BMI 29.52 kg/m   General Appearance:    Alert, cooperative, no distress, appears stated age  Head:    Normocephalic, without obvious abnormality, atraumatic  Eyes:    conjunctiva/corneas clear, EOM's intact, both eyes  Ears:    Normal external ear canals, both ears  Nose:   Nares normal, septum midline, mucosa normal, no drainage    or sinus tenderness  Throat:   Lips, mucosa, and tongue normal; teeth and gums normal  Neck:   Supple, symmetrical, trachea midline, no adenopathy;    thyroid:  no enlargement/tenderness/nodules  Back:     Symmetric, no curvature, ROM normal, no CVA tenderness  Lungs:     respirations unlabored  Chest Wall:    No tenderness or deformity   Heart:    Regular rate and rhythm  Breast Exam:    No tenderness, masses, or nipple abnormality  Abdomen:     Soft, non-tender, bowel sounds active all four quadrants,    no masses, no  organomegaly  Genitalia:    Normal female without lesion; no Bartholin's swelling noted; discharge or tenderness; no adnexal masses or tenderness.      Extremities:   Extremities normal, atraumatic, no cyanosis or edema  Pulses:   2+ and symmetric all extremities  Skin:   Skin color, texture, turgor normal, no rashes or lesions; multiple tattoos.     Assessment:    Healthy female exam.   Contraception counseling- reviewed options with pt. She elects to cont OCPs.    Plan:  F/u in 1 year or sooner prn  Keep Sprintec daily F/u STI screen.   Tyrees Chopin L. Harraway-Smith, M.D., Cherlynn June

## 2020-07-19 LAB — CYTOLOGY - PAP
Chlamydia: NEGATIVE
Comment: NEGATIVE
Comment: NORMAL
Diagnosis: NEGATIVE
Neisseria Gonorrhea: NEGATIVE

## 2020-07-26 ENCOUNTER — Ambulatory Visit: Payer: BC Managed Care – PPO | Admitting: Obstetrics & Gynecology

## 2020-07-29 ENCOUNTER — Encounter: Payer: Self-pay | Admitting: Family

## 2020-07-29 ENCOUNTER — Telehealth (INDEPENDENT_AMBULATORY_CARE_PROVIDER_SITE_OTHER): Payer: BC Managed Care – PPO | Admitting: Family Medicine

## 2020-07-29 ENCOUNTER — Encounter: Payer: Self-pay | Admitting: Family Medicine

## 2020-07-29 VITALS — Wt 170.0 lb

## 2020-07-29 DIAGNOSIS — R42 Dizziness and giddiness: Secondary | ICD-10-CM

## 2020-07-29 DIAGNOSIS — R110 Nausea: Secondary | ICD-10-CM | POA: Diagnosis not present

## 2020-07-29 DIAGNOSIS — R197 Diarrhea, unspecified: Secondary | ICD-10-CM

## 2020-07-29 DIAGNOSIS — R194 Change in bowel habit: Secondary | ICD-10-CM

## 2020-07-29 DIAGNOSIS — L989 Disorder of the skin and subcutaneous tissue, unspecified: Secondary | ICD-10-CM

## 2020-07-29 DIAGNOSIS — Z85850 Personal history of malignant neoplasm of thyroid: Secondary | ICD-10-CM

## 2020-07-29 NOTE — Progress Notes (Addendum)
Virtual Visit via Video Note  I connected with Jenna [name]  on 07/29/20 at  3:40 PM EDT by a video enabled telemedicine application and verified that I am speaking with the correct person using two identifiers.  Location patient:  [name] Location provider:work or home office Persons participating in the virtual visit: patient, provider  I discussed the limitations of evaluation and management by telemedicine and the availability of in person appointments. The patient expressed understanding and agreed to proceed.   HPI:  Acute visit for diarrhea: -started the day before yesterday -symptoms include upset stomach, diarrhea or loose bowel (10x yesterday), some on and off pain in abd yesterday - waves, nausea a few times, felt lightheaded a few times yesterday while working outside, upset stomach and some lightheaded feelings today, feels warm -better today with no diarrhea today -she works outside Scientist, research (physical sciences) -she was stung by something 5 days ago on her thigh - swelled up and she took benadryl and the swelling and redness improved, but has been itchy and has some redness around it - she has been scratching it -denies fevers, melena, hematochezia, cough, CP, HA, congestion, sore throat, malaise -denies new medications or antibiotics recently -had thyroidectomy a few months ago for papillary carcinoma, not on any medications - monitored by surgery and endocrinology -no known sick contacts -not vaccinated for COVID19  ROS: See pertinent positives and negatives per HPI.  Past Medical History:  Diagnosis Date  . Anxiety   . Chronic diarrhea   . Cyst of ovary 12/2017  . Depression   . Dysrhythmia    PVCs  . Headache    stress related  . Heart murmur    since childhood  . Rapid palpitations 07/08/2015  . Vasovagal syncope 2011   no since then    Past Surgical History:  Procedure Laterality Date  . THYROID LOBECTOMY Right 05/27/2020   Procedure: RIGHT THYROID LOBECTOMY;  Surgeon:  Armandina Gemma, MD;  Location: [facility];  Service: General;  Laterality: Right;  . WISDOM TOOTH EXTRACTION      Family History  Problem Relation Age of Onset  . Arrhythmia Maternal Grandmother   . Hypertension Maternal Grandfather   . Diabetes Maternal Grandfather   . Heart attack Maternal Grandfather   . Heart disease Maternal Grandfather   . Heart murmur Mother   . Melanoma Mother        stage 3 malignant melanoma  . Healthy Father   . Healthy Brother   . Scoliosis Brother   . Other Maternal Aunt        TACHYCARDIA  . Diabetes Paternal Grandfather   . Arrhythmia Other        Aunt w/ tachyarrythmia at age 3  . Cancer Other     SOCIAL HX: see hpi   Current Outpatient Medications:  .  ibuprofen (ADVIL) 600 MG tablet, Take 1 tablet (600 mg total) by mouth every 6 (six) hours as needed., Disp: 20 tablet, Rfl: 0 .  norgestimate-ethinyl estradiol (SPRINTEC 28) 0.25-35 MG-MCG tablet, Take 1 tablet by mouth daily., Disp: 1 Package, Rfl: 11 .  valACYclovir (VALTREX) 1000 MG tablet, TAKE 2 TABLETS BY MOUTH NOW AND  AGAIN  IN  12  HOURS, Disp: 4 tablet, Rfl: 0  EXAM:  VITALS per patient if applicable:  GENERAL: alert, oriented, appears well and in no acute distress  HEENT: atraumatic, conjunttiva clear, no obvious abnormalities on inspection of external nose and ears  NECK: normal movements of the head and neck  LUNGS: on inspection no signs of respiratory distress, breathing rate appears normal, no obvious gross SOB, gasping or wheezing  CV: no obvious cyanosis  SKIN: small area of light erythema around small papule on thigh  MS: moves all visible extremities without noticeable abnormality  PSYCH/NEURO: pleasant and cooperative, no obvious depression or anxiety, speech and thought processing grossly intact  ASSESSMENT AND PLAN:  Discussed the following assessment and plan:  Diarrhea, unspecified type  Light headed  Nausea  Skin lesion  Hx of thyroid cancer  -we  discussed possible serious and likely etiologies, options for evaluation and workup, limitations of telemedicine visit vs in person visit, treatment, treatment risks and precautions. Pt prefers to treat via telemedicine empirically rather then risking or undertaking an in person visit at this moment. Query gastroenteritis as cause of the diarrhea, nausea, lightheaded feeling. May have gotten somewhat dehydrated working outside and having diarrhea yesterday. She thinks is doing a little better today. Advise oral hydration, bland diet, imodium if needed and COVID19 testing given surge in cases, unvacinnated and some cases start with diarrhea. I don't think the sting is related, and it seems like she likely had a local reaction since improved with benadryl. However, warned can sometimes develop secondary infection and advised marking the area and monitoring and seeking prompt inperson care if any worsening. Also, given hx advised low threshold to seek prompt in person care if worsening, new symptoms arise, or if is not improving with treatment. More than thirty minutes spent on this appointment.   I discussed the assessment and treatment plan with the patient. The patient was provided an opportunity to ask questions and all were answered. The patient agreed with the plan and demonstrated an understanding of the instructions.   The patient was advised to call back or seek an in-person evaluation if the symptoms worsen or if the condition fails to improve as anticipated.   Jenna Kern, DO

## 2020-07-29 NOTE — Patient Instructions (Signed)
-  dink plenty of water - if not eating drink soup broth or gatorade instead  -imodium if any further diarrhea  -get a COVID19 test  -stay home while sick except to seek medical care and until negative COVID19 test. If positive follow up with your doctor and also stay home for a full 10 days even if you are feeling better.  -with a ball point pen mark around the area of redness near the sting - monitor closely and if any worsening, spreading, pain or other symptoms seek prompt medical evaluation.  I hope you are feeling better soon! Seek care promptly if your symptoms worsen, new concerns arise or you are not improving with treatment.

## 2020-07-29 NOTE — Addendum Note (Signed)
Addended by: Lamar Blinks C on: 07/29/2020 05:57 PM   Modules accepted: Orders

## 2020-07-29 NOTE — Telephone Encounter (Signed)
Please advise in PCPs absence.  

## 2020-07-30 ENCOUNTER — Encounter: Payer: Self-pay | Admitting: Gastroenterology

## 2020-09-27 ENCOUNTER — Encounter: Payer: Self-pay | Admitting: *Deleted

## 2020-09-28 ENCOUNTER — Ambulatory Visit (INDEPENDENT_AMBULATORY_CARE_PROVIDER_SITE_OTHER): Payer: BC Managed Care – PPO | Admitting: Gastroenterology

## 2020-09-28 ENCOUNTER — Encounter: Payer: Self-pay | Admitting: Gastroenterology

## 2020-09-28 VITALS — BP 110/68 | HR 76 | Ht 64.0 in | Wt 172.0 lb

## 2020-09-28 DIAGNOSIS — R198 Other specified symptoms and signs involving the digestive system and abdomen: Secondary | ICD-10-CM

## 2020-09-28 DIAGNOSIS — R12 Heartburn: Secondary | ICD-10-CM | POA: Diagnosis not present

## 2020-09-28 MED ORDER — SUTAB 1479-225-188 MG PO TABS: 1.0000 | ORAL_TABLET | ORAL | 0 refills | Status: DC

## 2020-09-28 MED ORDER — PANTOPRAZOLE SODIUM 40 MG PO TBEC
40.0000 mg | DELAYED_RELEASE_TABLET | Freq: Every day | ORAL | 0 refills | Status: DC
Start: 2020-09-28 — End: 2021-01-24

## 2020-09-28 NOTE — Patient Instructions (Signed)
You have been scheduled for an endoscopy and colonoscopy. Please follow the written instructions given to you at your visit today. Please pick up your prep supplies at the pharmacy within the next 1-3 days. If you use inhalers (even only as needed), please bring them with you on the day of your procedure.  We have sent the following medications to your pharmacy for you to pick up at your convenience: Pantoprazole 40 mg every morning  Please follow reflux lifestyle modifications as reviewed at your visit today. We have given you a reflux handout to review today as well.  If you are age 27 or younger, your body mass index should be between 19-25. Your Body mass index is 29.52 kg/m. If this is out of the aformentioned range listed, please consider follow up with your Primary Care Provider.   Due to recent changes in healthcare laws, you may see the results of your imaging and laboratory studies on MyChart before your provider has had a chance to review them.  We understand that in some cases there may be results that are confusing or concerning to you. Not all laboratory results come back in the same time frame and the provider may be waiting for multiple results in order to interpret others.  Please give [place] 48 hours in order for your provider to thoroughly review all the results before contacting the office for clarification of your results.

## 2020-09-28 NOTE — Progress Notes (Addendum)
Referring Provider: Debbrah Alar, NP Primary Care Physician:  Debbrah Alar, NP  Reason for Consultation:  Change in bowel habits   IMPRESSION:  Alternating bowel habits with associated abdominal pain, nausea and bloating Daily heartburn not responding to Pepcid and TUMS Normal TSH following recent thyroidectomy  Suspected irritable bowel syndrome.Must consider IBS masqueraders including celiac disease, food intolerance (lactose, fructose, sucrose), SIBO, H pylori, IBD.  Will start with EGD and colonoscopy. Consider additional testing for parasites, breath test for SIBO, fructose testing if endoscopic evaluation is negative.  Heartburn: Start pantoprazole. Obtain esophageal biopsies at the time of EGD.   PLAN: Reviewed reflux lifestyle modifications Pantoprazole 40 mg QAM EGD with duodenal biopsies and colonoscopy with evaluation of the terminal ileum Low threshold to add dicyclomine, daily psyllium for symptomatic relief  Please see the "Patient Instructions" section for addition details about the plan.  HPI: Jenna [name] is a 29 y.o. female referred by Dr. Lorelei Pont for further evaluation of change in bowel habits. The history is obtained through the patient and review of her electronic health record. 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  She builds swimming pools.   History of altered bowels - predominantly constipation -  as a child attributed to stress.   Almost one year history of change in bowel habits with alternating urgent diarrhea and constipation. She has diffuse abdominal pain, with nausea, bloating, early satiety.  Noted distension after eating in the morning, even after drinking a glass of water. No accidents. Occasional nocturnal symptoms. Rare rectal pain that she wonders if it's gas build up. Debbrah Alar thought her symptoms may be related to stress but the symptoms  have persistent even after full recovery from there thyroidectomy.  No blood or mucous in the stool.  Frequent sour taste in her mouth and daily reflux that she treats with Pepcid PRN and TUMS PRN.  No blood or mucous. No dysphagia, odynophagia, dysphonia, sore throat, neck pain.   Follows a diet with little dairy. No mammalian meat triggers. Primarily eats chicken.  No symptoms with consumption of carbonated beverages, caffeinated beverages, lactose, gluten, fructose or other gas-producing foods such as legumes, onions, cabbage, brussel sprouts, artificial sweeteners or sugar substitutes.   CT abd/pelvis with contrast 01/04/18: 4.5 cm right ovarian/adnexal cyst. No other abnormalities.  TSH 3.78 06/17/20 Normal CBC 05/19/20  Paternal great grandmother had stomach cancer. Maternal great uncle with liver cancer. Maternal grandmother with IBS and polyps. No known family history of colon cancer or polyps. No family history of uterine/endometrial cancer, pancreatic cancer or gastric/stomach cancer.   Past Medical History:  Diagnosis Date  . Anxiety   . Chronic diarrhea   . Cyst of ovary 12/2017  . Depression   . Dysrhythmia    PVCs  . Headache    stress related  . Heart murmur    since childhood  . Papillary thyroid carcinoma ([place])   . Rapid palpitations 07/08/2015  . Vasovagal syncope 2011   no since then    Past Surgical History:  Procedure Laterality Date  . THYROID LOBECTOMY Right 05/27/2020   Procedure: RIGHT THYROID LOBECTOMY;  Surgeon: Armandina Gemma, MD;  Location: [facility];  Service: General;  Laterality: Right;  . WISDOM TOOTH EXTRACTION      Current Outpatient Medications  Medication Sig Dispense Refill  . ibuprofen (ADVIL) 600 MG tablet Take 1 tablet (600 mg total) by mouth every 6 (six) hours as needed. [place]  tablet 0  . norgestimate-ethinyl estradiol (SPRINTEC 28) 0.25-35 MG-MCG tablet Take 1 tablet by mouth daily. 1 Package 11  . valACYclovir (VALTREX) 1000 MG tablet TAKE 2  TABLETS BY MOUTH NOW AND  AGAIN  IN  12  HOURS 4 tablet 0   No current facility-administered medications for this visit.    Allergies as of 09/28/2020 - Review Complete 07/29/2020  Allergen Reaction Noted  . Adhesive [tape] Itching and Rash 11/17/2016    Family History  Problem Relation Age of Onset  . Arrhythmia Maternal Grandmother   . Hypertension Maternal Grandfather   . Diabetes Maternal Grandfather   . Heart attack Maternal Grandfather   . Heart disease Maternal Grandfather   . Heart murmur Mother   . Melanoma Mother        stage 3 malignant melanoma  . Healthy Father   . Healthy Brother   . Scoliosis Brother   . Other Maternal Aunt        TACHYCARDIA  . Diabetes Paternal Grandfather   . Arrhythmia Other        Aunt w/ tachyarrythmia at age 57  . Cancer Other     Social History   Socioeconomic History  . Marital status: Single    Spouse name: Not on file  . Number of children: Not on file  . Years of education: Not on file  . Highest education level: Not on file  Occupational History  . Not on file  Tobacco Use  . Smoking status: Current Every Day Smoker    Packs/day: 0.50    Years: 10.00    Pack years: 5.00    Types: Cigarettes  . Smokeless tobacco: Never Used  . Tobacco comment: 7 cigarettes daily  Vaping Use  . Vaping Use: Former  . Start date: 05/20/2015  . Quit date: 05/19/2017  . Substances: Nicotine, Flavoring  Substance and Sexual Activity  . Alcohol use: Yes    Alcohol/week: 1.0 standard drink    Types: 1 Standard drinks or equivalent per week    Comment: 2 beers/week  . Drug use: No  . Sexual activity: Yes    Partners: Male    Birth control/protection: Pill    Comment: Patient declined to answer sexual history.  Other Topics Concern  . Not on file  Social History Narrative   Lives in [place] with her mother, Hajira Verhagen, and her brother, Lamount Cranker.  Identifies as [place]. Endorses tobacco use as well as rare THC use.  Denies ETOH use. + sexually active. Has implanon.     GED   Installs pools   Social Determinants of Health   Financial Resource Strain:   . Difficulty of Paying Living Expenses: Not on file  Food Insecurity:   . Worried About Charity fundraiser in the Last Year: Not on file  . Ran Out of Food in the Last Year: Not on file  Transportation Needs:   . Lack of Transportation (Medical): Not on file  . Lack of Transportation (Non-Medical): Not on file  Physical Activity:   . Days of Exercise per Week: Not on file  . Minutes of Exercise per Session: Not on file  Stress:   . Feeling of Stress : Not on file  Social Connections:   . Frequency of Communication with Friends and Family: Not on file  . Frequency of Social Gatherings with Friends and Family: Not on file  . Attends Religious Services: Not on file  . Active Member  of Clubs or Organizations: Not on file  . Attends Archivist Meetings: Not on file  . Marital Status: Not on file  Intimate Partner Violence:   . Fear of Current or Ex-Partner: Not on file  . Emotionally Abused: Not on file  . Physically Abused: Not on file  . Sexually Abused: Not on file    Review of Systems: 12 system ROS is negative except as noted above with the addition of fatigue, headaches, night sweats, insomnia, and excessive thirst.   Physical Exam: General:   Alert,  well-nourished, pleasant and cooperative in NAD Head:  Normocephalic and atraumatic. Eyes:  Sclera clear, no icterus.   Conjunctiva pink. Ears:  Normal auditory acuity. Nose:  No deformity, discharge,  or lesions. Mouth:  No deformity or lesions.   Neck:  Supple; no masses or thyromegaly. Lungs:  Clear throughout to auscultation.   No wheezes. Heart:  Regular rate and rhythm; no murmurs. Abdomen:  Soft, nontender, nondistended, normal bowel sounds, no rebound or guarding. No hepatosplenomegaly.   Rectal:  Deferred  Msk:  Symmetrical. No boney deformities LAD: No inguinal or  umbilical LAD Extremities:  No clubbing or edema. Neurologic:  Alert and  oriented x4;  grossly nonfocal Skin:  Intact without significant lesions or rashes. Psych:  Alert and cooperative. Normal mood and affect.    Zakarie Sturdivant L. Tarri Glenn, MD, MPH 09/28/2020, 3:15 PM

## 2020-10-05 ENCOUNTER — Telehealth: Payer: Self-pay | Admitting: *Deleted

## 2020-10-05 NOTE — Telephone Encounter (Signed)
Patient's insurance, [employer] has denied pantoprazole as she has not tried and failed two over-the- counter generic PPI's (such as omeprazole, lansoprazole, esomeprazole).  Insurance also indicates that over-the-counter alternatives are not covered by pharmacy benefits.  Reference #[id number]  Pantoprazole is available for fairly cheap price with goodrx card. If patient would like, she can get rx at [facility] for $6.84, [facility] for $10.50, [facility] for $11.50 and [facility] for $16.28.  I have left a message for patient to call back.

## 2020-10-05 NOTE — Telephone Encounter (Signed)
I have spoken to patient to advise regarding pantoprazole and our suggestion on how to get for affordable pricing. She verbalizes understanding of this.  Patient also notes that her pharmacy told her the her Jenna [name] would be $60 dollars. I have contacted [facility] to ask that they run the prescription coupon again. Rx is now running through at $40.00.

## 2020-10-12 DIAGNOSIS — Z20822 Contact with and (suspected) exposure to covid-19: Secondary | ICD-10-CM | POA: Diagnosis not present

## 2020-10-18 NOTE — Telephone Encounter (Signed)
Patient called states the pharmacy is charging her more for the [facility]

## 2020-10-19 NOTE — Telephone Encounter (Signed)
I have left message advising patient that I am unsure why pharmacy is now telling her rx is $60 again since they ran it though at $40 on the 19th. I have just placed a sample at the front desk for her to pick up. That way, she doesn't have to worry about the discrepancy anymore.

## 2020-11-15 ENCOUNTER — Other Ambulatory Visit: Payer: Self-pay | Admitting: Gastroenterology

## 2020-11-15 DIAGNOSIS — Z1159 Encounter for screening for other viral diseases: Secondary | ICD-10-CM | POA: Diagnosis not present

## 2020-11-15 LAB — SARS CORONAVIRUS 2 (TAT 6-24 HRS): SARS Coronavirus 2: NEGATIVE

## 2020-11-16 ENCOUNTER — Ambulatory Visit: Payer: Self-pay | Admitting: Internal Medicine

## 2020-11-17 ENCOUNTER — Other Ambulatory Visit: Payer: Self-pay

## 2020-11-17 ENCOUNTER — Encounter: Payer: Self-pay | Admitting: Gastroenterology

## 2020-11-17 ENCOUNTER — Ambulatory Visit (AMBULATORY_SURGERY_CENTER): Payer: BC Managed Care – PPO | Admitting: Gastroenterology

## 2020-11-17 VITALS — BP 108/62 | HR 64 | Temp 97.7°F | Resp 25 | Ht 64.0 in | Wt 172.0 lb

## 2020-11-17 DIAGNOSIS — K2210 Ulcer of esophagus without bleeding: Secondary | ICD-10-CM | POA: Diagnosis not present

## 2020-11-17 DIAGNOSIS — K2980 Duodenitis without bleeding: Secondary | ICD-10-CM | POA: Diagnosis not present

## 2020-11-17 DIAGNOSIS — R12 Heartburn: Secondary | ICD-10-CM

## 2020-11-17 DIAGNOSIS — R194 Change in bowel habit: Secondary | ICD-10-CM

## 2020-11-17 DIAGNOSIS — K2970 Gastritis, unspecified, without bleeding: Secondary | ICD-10-CM | POA: Diagnosis not present

## 2020-11-17 DIAGNOSIS — K259 Gastric ulcer, unspecified as acute or chronic, without hemorrhage or perforation: Secondary | ICD-10-CM | POA: Diagnosis not present

## 2020-11-17 DIAGNOSIS — R198 Other specified symptoms and signs involving the digestive system and abdomen: Secondary | ICD-10-CM

## 2020-11-17 MED ORDER — SODIUM CHLORIDE 0.9 % IV SOLN
500.0000 mL | Freq: Once | INTRAVENOUS | Status: DC
Start: 2020-11-17 — End: 2020-11-17

## 2020-11-17 NOTE — Op Note (Signed)
[facility] Patient Name: Jenna [name] Procedure Date: 11/17/2020 1:04 PM MRN: [id number] Endoscopist: Thornton Park MD, MD Age: 29 Referring MD:  Date of Birth: [birth year] Gender: Female Account #: [id number] Procedure:                Colonoscopy Indications:              Abdominal pain with alternating bowel habits,                            nausea, and bloating Medicines:                Monitored Anesthesia Care Procedure:                Pre-Anesthesia Assessment:                           - Prior to the procedure, a History and Physical                            was performed, and patient medications and                            allergies were reviewed. The patient's tolerance of                            previous anesthesia was also reviewed. The risks                            and benefits of the procedure and the sedation                            options and risks were discussed with the patient.                            All questions were answered, and informed consent                            was obtained. Prior Anticoagulants: The patient has                            taken no previous anticoagulant or antiplatelet                            agents. ASA Grade Assessment: II - A patient with                            mild systemic disease. After reviewing the risks                            and benefits, the patient was deemed in                            satisfactory condition to undergo the procedure.  After obtaining informed consent, the colonoscope                            was passed under direct vision. Throughout the                            procedure, the patient's blood pressure, pulse, and                            oxygen saturations were monitored continuously. The                            Colonoscope was introduced through the anus and                            advanced to the 3 cm into the ileum. The                             colonoscopy was performed with moderate difficulty                            due to significant looping and a tortuous colon.                            Successful completion of the procedure was aided by                            applying abdominal pressure. The patient tolerated                            the procedure well. The quality of the bowel                            preparation was excellent. The terminal ileum,                            ileocecal valve, appendiceal orifice, and rectum                            were photographed. Scope In: 1:24:23 PM Scope Out: 1:35:07 PM Scope Withdrawal Time: 0 hours 5 minutes 37 seconds  Total Procedure Duration: 0 hours 10 minutes 44 seconds  Findings:                 The perianal and digital rectal examinations were                            normal.                           The colon (entire examined portion) appeared                            normal. Biopsies were taken from the right colon  and left colon with a cold forceps for histology.                            Estimated blood loss was minimal.                           The terminal ileum appeared normal.                           The exam was otherwise without abnormality on                            direct and retroflexion views. Complications:            No immediate complications. Estimated blood loss:                            Minimal. Estimated Blood Loss:     Estimated blood loss was minimal. Impression:               - The entire examined colon is normal. Biopsied.                           - The examined portion of the ileum was normal.                           - The examination was otherwise normal on direct                            and retroflexion views. Recommendation:           - Patient has a contact number available for                            emergencies. The signs and symptoms of potential                             delayed complications were discussed with the                            patient. Return to normal activities tomorrow.                            Written discharge instructions were provided to the                            patient.                           - Resume previous diet.                           - Continue present medications.                           - Await pathology results.                           -  Please call the office to schedule a follow-up                            appointment. Thornton Park MD, MD 11/17/2020 1:49:27 PM This report has been signed electronically.

## 2020-11-17 NOTE — Progress Notes (Signed)
Called to room to assist during endoscopic procedure.  Patient ID and intended procedure confirmed with present staff. Received instructions for my participation in the procedure from the performing physician.  

## 2020-11-17 NOTE — Progress Notes (Signed)
VS- Jenna [name] 

## 2020-11-17 NOTE — Patient Instructions (Signed)
Increase Pantoprazole to 40 mg twice daily.  No aspirin, ibuprofen, naproxen or other non-steroidal anti-inflammatory drugs.     YOU HAD AN ENDOSCOPIC PROCEDURE TODAY AT [facility]:   Refer to the procedure report that was given to you for any specific questions about what was found during the examination.  If the procedure report does not answer your questions, please call your gastroenterologist to clarify.  If you requested that your care partner not be given the details of your procedure findings, then the procedure report has been included in a sealed envelope for you to review at your convenience later.  YOU SHOULD EXPECT: Some feelings of bloating in the abdomen. Passage of more gas than usual.  Walking can help get rid of the air that was put into your GI tract during the procedure and reduce the bloating. If you had a lower endoscopy (such as a colonoscopy or flexible sigmoidoscopy) you may notice spotting of blood in your stool or on the toilet paper. If you underwent a bowel prep for your procedure, you may not have a normal bowel movement for a few days.  Please Note:  You might notice some irritation and congestion in your nose or some drainage.  This is from the oxygen used during your procedure.  There is no need for concern and it should clear up in a day or so.  SYMPTOMS TO REPORT IMMEDIATELY:   Following lower endoscopy (colonoscopy or flexible sigmoidoscopy):  Excessive amounts of blood in the stool  Significant tenderness or worsening of abdominal pains  Swelling of the abdomen that is new, acute  Fever of 100F or higher   Following upper endoscopy (EGD)  Vomiting of blood or coffee ground material  New chest pain or pain under the shoulder blades  Painful or persistently difficult swallowing  New shortness of breath  Fever of 100F or higher  Black, tarry-looking stools  For urgent or emergent issues, a gastroenterologist can be reached at any hour  by calling [phone]. Do not use MyChart messaging for urgent concerns.    DIET:  We do recommend a small meal at first, but then you may proceed to your regular diet.  Drink plenty of fluids but you should avoid alcoholic beverages for 24 hours.  ACTIVITY:  You should plan to take it easy for the rest of today and you should NOT DRIVE or use heavy machinery until tomorrow (because of the sedation medicines used during the test).    FOLLOW UP: Our staff will call the number listed on your records 48-72 hours following your procedure to check on you and address any questions or concerns that you may have regarding the information given to you following your procedure. If we do not reach you, we will leave a message.  We will attempt to reach you two times.  During this call, we will ask if you have developed any symptoms of COVID 19. If you develop any symptoms (ie: fever, flu-like symptoms, shortness of breath, cough etc.) before then, please call [phone].  If you test positive for Covid 19 in the 2 weeks post procedure, please call and report this information to [place].    If any biopsies were taken you will be contacted by phone or by letter within the next 1-3 weeks.  Please call us at [phone] if you have not heard about the biopsies in 3 weeks.    SIGNATURES/CONFIDENTIALITY: You and/or your care partner have signed paperwork which  will be entered into your electronic medical record.  These signatures attest to the fact that that the information above on your After Visit Summary has been reviewed and is understood.  Full responsibility of the confidentiality of this discharge information lies with you and/or your care-partner.

## 2020-11-17 NOTE — Progress Notes (Signed)
Report to PACU, RN, vss, BBS= Clear.  

## 2020-11-17 NOTE — Op Note (Signed)
[place] Patient Name: Jenna [name] Procedure Date: 11/17/2020 1:04 PM MRN: [id number] Endoscopist: Thornton Park MD, MD Age: 29 Referring MD:  Date of Birth: May 01, 1991 Gender: Female Account #: [id number] Procedure:                Upper GI endoscopy Indications:              Abdominal pain, Abdominal bloating, Nausea                           Daily heartburn not responding to Pepcid Medicines:                Monitored Anesthesia Care Procedure:                Pre-Anesthesia Assessment:                           - Prior to the procedure, a History and Physical                            was performed, and patient medications and                            allergies were reviewed. The patient's tolerance of                            previous anesthesia was also reviewed. The risks                            and benefits of the procedure and the sedation                            options and risks were discussed with the patient.                            All questions were answered, and informed consent                            was obtained. Prior Anticoagulants: The patient has                            taken no previous anticoagulant or antiplatelet                            agents. ASA Grade Assessment: II - A patient with                            mild systemic disease. After reviewing the risks                            and benefits, the patient was deemed in                            satisfactory condition to undergo the procedure.  After obtaining informed consent, the endoscope was                            passed under direct vision. Throughout the                            procedure, the patient's blood pressure, pulse, and                            oxygen saturations were monitored continuously. The                            Endoscope was introduced through the mouth, and                            advanced to the third  part of duodenum. The upper                            GI endoscopy was accomplished without difficulty.                            The patient tolerated the procedure well. Scope In: Scope Out: Findings:                 Many small cratered esophageal ulcers with no                            bleeding and no stigmata of recent bleeding were                            found in the mid to distal esophagus. The mucosa                            between ulcers appears normal. Biopsies were taken                            with a cold forceps for histology. Estimated blood                            loss was minimal.                           The remainder of the examined esophagus appears                            normal. Biopsies were obtained from the                            mid/proximal and distal esophagus with cold forceps                            for histology.                           Diffuse mild inflammation  characterized by                            erythema, friability, heme, and granularity was                            found in the gastric body. Biopsies were taken from                            the antrum, body, and fundus with a cold forceps                            for histology. Estimated blood loss was minimal.                           Diffuse mildly erythematous mucosa without active                            bleeding and with no stigmata of bleeding was found                            in the duodenal bulb. Biopsies were taken with a                            cold forceps for histology. Estimated blood loss                            was minimal.                           The cardia and gastric fundus were normal on                            retroflexion.                           The exam was otherwise without abnormality. Complications:            No immediate complications. Estimated blood loss:                            Minimal. Estimated Blood Loss:      Estimated blood loss was minimal. Impression:               - Esophageal ulcers with no bleeding and no                            stigmata of recent bleeding. Biopsied.                           - Normal esophagus. Biopsied.                           - Gastritis. Biopsied.                           -  Erythematous duodenopathy. Biopsied.                           - The examination was otherwise normal. Recommendation:           - Patient has a contact number available for                            emergencies. The signs and symptoms of potential                            delayed complications were discussed with the                            patient. Return to normal activities tomorrow.                            Written discharge instructions were provided to the                            patient.                           - Resume previous diet.                           - Continue present medications. Increase                            pantoprazole to 40 mg BID.                           - No aspirin, ibuprofen, naproxen, or other                            non-steroidal anti-inflammatory drugs.                           - Await pathology results.                           - Proceed with colonoscopy as previously planned. Thornton Park MD, MD 11/17/2020 1:44:28 PM This report has been signed electronically.

## 2020-11-18 ENCOUNTER — Telehealth: Payer: Self-pay | Admitting: *Deleted

## 2020-11-18 NOTE — Telephone Encounter (Signed)
Called patient back  and left her a voicemail.

## 2020-11-18 NOTE — Telephone Encounter (Signed)
Called pt to inquire further about her concerns. States she does not feel pain in her chest but rather in her epigastric region of her abd. States she feels bloated and gassy. Denies having productive cough, fever, chills, or dyspnea with ambulation or when exerting herself. Reassured that it is not uncommon to have gas or bloating-like symptoms after her procedure. Provided recommendations for positioning to help relieve her symptoms. Advised to continue to move around as she is able, to cough and deep breath to reduce risk for PN and to try carbonated beverages to aid in expulsion of gas. Expressed concern that she has not been able to eat a "normal diet". Reassured this is normal and advised to eat a light diet until she feels up to resuming her normal diet. Also advised to keep herself well hydrated.  While on the phone, pt states she also has discomfort with her back and her legs. In terms of her back, reassured this is not uncommon d/t positioning during the procedure and d/t her gas symptoms. Reminded this should improve within 48 hours. Wth regards to her lower extremities, asked if she had increased swelling to her lower extremities, if her lower extremities appeared asymmetrical, warmth, redness or any pain in the calf with ambulation or hyperflexion of her feet. Denied having any of these symptoms. States "it just feels like I worked out too hard". Again, advised to keep herself well hydrated, reduce sitting for extended periods of time or crossing her legs while relaxing. Advised she will notice improvement of her symptoms within 48 hours. Education and instructions were provided about s/sx of DVT, PE and PN, when to call the office or 911. Verbalized acceptance and understanding of all information provided.

## 2020-11-18 NOTE — Telephone Encounter (Signed)
I was off this morning and am just seeing this message. Would you please call the patient to see how she is feeling this afternoon?

## 2020-11-18 NOTE — Telephone Encounter (Signed)
Sarah, its Dr.Beavers patient, I am forwarding to her. Thanks

## 2020-11-18 NOTE — Telephone Encounter (Signed)
PT called c/o discomfort when she eats, drinks or takes a deep breath since her EGD from yesterday. No other pain, bleeding or fever. She did increase her pantoprazole to 40 mg this morning. told her were still waiting on Biopsy results. Will notify Dr. Silverio Decamp.

## 2020-11-19 ENCOUNTER — Telehealth: Payer: Self-pay

## 2020-11-19 NOTE — Telephone Encounter (Signed)
Good morning!  Patient returned phone call. Patient state sshe is feeling fine.. No concerns at this time..  Thanks, Have a great day!!!

## 2020-11-19 NOTE — Telephone Encounter (Signed)
Spoke with pt and she states her chest and stomach feel much better today. Reports she does not have that gas/bloating feeling now. Her throat is a little irritated. Discussed with her it should resolve but to call us if she has any other problems.

## 2020-11-19 NOTE — Telephone Encounter (Signed)
Please call the patient to see how she is feeling today. Thanks.

## 2020-11-19 NOTE — Telephone Encounter (Signed)
Called [phone] and I did not  leave a message that we tried to reach pt for a follow up call.  Her note did not say "yes" or "no" that we could leave a message. maw

## 2020-11-19 NOTE — Telephone Encounter (Signed)
Pt calls back for follow up, states she is doing well, no problems post procedure.

## 2020-11-19 NOTE — Telephone Encounter (Signed)
Great news. Thanks for the follow-up.

## 2020-12-08 DIAGNOSIS — Z03818 Encounter for observation for suspected exposure to other biological agents ruled out: Secondary | ICD-10-CM | POA: Diagnosis not present

## 2020-12-23 ENCOUNTER — Ambulatory Visit (INDEPENDENT_AMBULATORY_CARE_PROVIDER_SITE_OTHER): Payer: BC Managed Care – PPO | Admitting: Internal Medicine

## 2020-12-23 ENCOUNTER — Other Ambulatory Visit: Payer: Self-pay

## 2020-12-23 ENCOUNTER — Encounter: Payer: Self-pay | Admitting: Internal Medicine

## 2020-12-23 VITALS — BP 120/76 | HR 98 | Ht 64.0 in | Wt 172.2 lb

## 2020-12-23 DIAGNOSIS — Z9889 Other specified postprocedural states: Secondary | ICD-10-CM

## 2020-12-23 DIAGNOSIS — C73 Malignant neoplasm of thyroid gland: Secondary | ICD-10-CM | POA: Diagnosis not present

## 2020-12-23 LAB — TSH: TSH: 2.21 u[IU]/mL (ref 0.35–4.50)

## 2020-12-23 LAB — T4, FREE: Free T4: 0.7 ng/dL (ref 0.60–1.60)

## 2020-12-23 LAB — T3, FREE: T3, Free: 3.3 pg/mL (ref 2.3–4.2)

## 2020-12-23 NOTE — Progress Notes (Signed)
Patient ID: Jenna [name], female   DOB: December 13, 1991, 30 y.o.   MRN: [id number]   This visit occurred during the SARS-CoV-2 public health emergency.  Safety protocols were in place, including screening questions prior to the visit, additional usage of staff PPE, and extensive cleaning of exam room while observing appropriate contact time as indicated for disinfecting solutions.   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  Last visit 6 months ago.  Reviewed and addended history: Her nodule was found at the time of APE with her PCP.  At that time, PCP noted that she had a lump in her neck and sent her for a thyroid ultrasound.  The ultrasound showed a large right thyroid nodule and a complex right smaller nodule (see below).  Thyroid U/S (10/29/2018): Right inferior 2.1 x 1 x 1.3 cm solid, hypoechoic, thyroid nodule       12/03/2018: FNA:  Adequacy Reason Satisfactory For Evaluation. 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   Afirma molecular marker: Benign  12/01/2019: Thyroid U/S: CLINICAL DATA:  Prior ultrasound follow-up. History of right-sided thyroid nodule fine-needle aspiration performed 12/03/2018.  Parenchymal Echotexture: Mildly heterogenous Isthmus: Normal in size measures 0.3 cm in diameter Right lobe: Borderline enlarged measuring 6.8 x 1.4 x 2.0 cm, unchanged, previously, 6.7 x 1.9 x 2.0 cm Left lobe: Normal in size measuring 5.6 x 1.4 x 1.7 cm, unchanged previously, 5.0 x 1.4 x 1.7 cm _________________________________________________________  The previously biopsied nodule within the inferior pole of the right lobe of the thyroid (labeled 1) is unchanged to slightly increased in size compared to the 10/2018  examination, currently measuring 2.4 x 1.7 x 1.2 cm, previously, 2.1 x 1.3 x 1.0 cm. Correlation with previous biopsy results is advised. _________________________________________________________  Nodule # 2: Prior biopsy: No Location: Right; Inferior Maximum size: 1.0 cm; Other 2 dimensions: 0.9 x 0.7 cm, previously, 0.9 x 0.8 x 0.7 cm Composition: solid/almost completely solid (2) Echogenicity: hypoechoic (2) Echogenic foci: punctate echogenic foci (3) ACR TI-RADS total points: 7. Change in features: Yes nodule now appears to contain punctate echogenic foci Change in ACR TI-RADS risk category: Yes previously characterized as a TR4 nodule, currently a TR5 nodule ACR TI-RADS recommendations:  **Given size (>/= 1.0 cm) and appearance, fine needle aspiration of this highly suspicious nodule should be considered based on TI-RADS criteria.  _________________________________________________________  There is an approximately 0.6 x 0.5 x 0.5 cm well-defined hypoechoic nodule within the mid aspect the right lobe of the thyroid which is unchanged in hind site compared to the 11/2018 examination and again does not meet imaging criteria to recommend percutaneous sampling or continued dedicated follow-up.  There is a punctate (approximately 0.2 cm) hypoechoic nodule within the left lobe of the thyroid which is unchanged in hind site compared to the 11/2018 examination and again does not meet imaging criteria to recommend percutaneous sampling or continued dedicated follow-up.  IMPRESSION: 1. Findings suggestive of multinodular goiter. No definitive new worrisome thyroid nodules. 2. Nodule #2 now appears to contain punctate echogenic foci and as such has been up graded from a TR4 nodule to a TR5 nodule and now meets imaging criteria to recommend percutaneous sampling as clinically indicated. 3. Previously biopsied nodule within the inferior pole of the right lobe of the  thyroid  is unchanged to slightly increased in size compared to the 10/2018 examination, currently measuring 2.4 cm, previously, 2.1 cm. Correlation with previous biopsy results is advised. Regardless of slight increase in size, assuming a benign pathologic diagnosis, repeat sampling and/or continued dedicated follow-up is not recommended.  The above is in keeping with the ACR TI-RADS recommendations - J Am Coll Radiol 2017;14:587-595.  Electronically Signed   By: Simonne Come M.D.   On: 12/01/2019 11:58  12/02/2019: Thyroid Uptake and scan: Hot nodule at inferior pole of RIGHT thyroid lobe with mild suppression of uptake throughout remaining thyroid tissue. No additional areas of increased or decreased tracer localization.  4 hour I-123 uptake = 14.5% (normal 5-20%)  24 hour I-123 uptake = 22.6% (normal 10-30%)  IMPRESSION: Normal 4 hour and 24 hour radio iodine uptakes.  Hot nodule at inferior pole RIGHT thyroid lobe with mild suppression of uptake of tracer within remaining thyroid tissue consistent with hyperfunctional RIGHT inferior thyroid adenoma.  Electronically Signed   By: Ulyses Southward M.D.   On: 12/02/2019 12:04  01/27/2020: FNA of the 1 x 0.9 x 0.7 cm right inferior nodule: FLUS (Bethesda category 3)  Afirma: Suspicious  05/27/2020: Right thyroid lobectomy with isthmusectomy by Dr. Gerrit Friends: FINAL MICROSCOPIC DIAGNOSIS:   A. 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  - No extrathyroidal extension identified.  - Margins of resection are not involved.  - See oncology table.   Procedure: Right hemithyroidectomy  Tumor Focality: Multifocal  Tumor Site: Right lobe  Tumor Size: Greatest dimension: 0.6 cm  Histologic Type: Papillary carcinoma  Margins: Uninvolved by tumor  Angioinvasion: Not identified  Lymphatic Invasion: Not identified  Extrathyroidal Extension: Not  identified  Regional Lymph Nodes: No lymph nodes submitted or found  Pathologic Stage Classification (pTNM, AJCC 8th Edition): mpT1a, pNX  Representative Tumor Block: A6  Comment: Dr. Berneice Heinrich reviewed the case.   Pt denies: - feeling nodules in neck - hoarseness - dysphagia - choking - SOB with lying down  She is not on levothyroxine.  Reviewed her TFTs: Lab Results  Component Value Date   TSH 3.78 06/17/2020   TSH 0.39 11/11/2019   TSH 0.43 10/11/2018   TSH 0.49 07/27/2017   TSH 0.50 12/05/2016   TSH 0.96 11/29/2015   TSH 1.29 07/08/2015   TSH 0.895 08/10/2011   FREET4 0.70 06/17/2020   FREET4 0.83 11/11/2019    + FH of thyroid ds in mother: multiple thyroid nodules - benign, MGGM - also thyroid nodule. No FH of thyroid cancer. No h/o radiation tx to head or neck.  No herbal supplements. No Biotin use. No recent steroids use. .   Pt also has a history of SVT (with chronic palpitations)- on beta blocker, heart murmur, HL.  She was started on Protonix for gastroduodenitis.  ROS: Constitutional: + weight gain over the holidays/no weight loss, no fatigue, no subjective hyperthermia, no subjective hypothermia Eyes: no blurry vision, no xerophthalmia ENT: no sore throat, + see HPI Cardiovascular: no CP/no SOB/no palpitations/no leg swelling Respiratory: no cough/no SOB/no wheezing Gastrointestinal: no N/no V/no D/no C/no acid reflux Musculoskeletal: no muscle aches/no joint aches Skin: no rashes, no hair loss Neurological: no tremors/no numbness/no tingling/no dizziness  I reviewed pt's medications, allergies, PMH, social hx, family hx, and changes were documented in the history of present illness. Otherwise, unchanged from my initial visit note.  Past Medical History:  Diagnosis Date  . Anxiety   .  Chronic diarrhea   . Cyst of ovary 12/2017  . Depression   . Dysrhythmia    PVCs  . GERD (gastroesophageal reflux disease)   . Headache    stress related  . Heart  murmur    since childhood  . Papillary thyroid carcinoma (HCC)    nodules on left lobe still- right side removed  . Rapid palpitations 07/08/2015  . SVT (supraventricular tachycardia) ([place])    last episode- 2 years ago  . Vasovagal syncope 2011   no since then   Past Surgical History:  Procedure Laterality Date  . THYROID LOBECTOMY Right 05/27/2020   Procedure: RIGHT THYROID LOBECTOMY;  Surgeon: Armandina Gemma, MD;  Location: [facility];  Service: General;  Laterality: Right;  . WISDOM TOOTH EXTRACTION     Social History   Socioeconomic History  . Marital status: Single    Spouse name: Not on file  . Number of children: 0  . Years of education: Not on file  . Highest education level: Not on file  Occupational History  .  Maintenance manager  Social Needs  . Financial resource strain: Not on file  . Food insecurity:    Worry: Not on file    Inability: Not on file  . Transportation needs:    Medical: Not on file    Non-medical: Not on file  Tobacco Use  . Smoking status: Current Every Day Smoker    Packs/day: 1.00    Types: Cigarettes  . Smokeless tobacco: Never Used  . Tobacco comment: 7 cigarettes daily  Substance and Sexual Activity  . Alcohol use: Yes    Alcohol/week:     Types:     Comment: Rarely, beer, 3 drinks  . Drug use: No  Lifestyle  . Physical activity:    Days per week: Not on file    Minutes per session: Not on file  . Stress: Not on file  Social History Narrative   Lives in [place] with her mother, Crickett [place]ocks[place], and her brother, Lamount Cranker.  Identifies as [place]. Endorses tobacco use as well as rare THC use. Denies ETOH use. + sexually active. Has implanon.     GED   Installs pools   Current Outpatient Medications on File Prior to Visit  Medication Sig Dispense Refill  . Famotidine (PEPCID PO) Take by mouth as needed.    Marland Kitchen ibuprofen (ADVIL) 600 MG tablet Take 1 tablet (600 mg total) by mouth every 6 (six) hours as needed. 20 tablet 0   . norgestimate-ethinyl estradiol (SPRINTEC 28) 0.25-35 MG-MCG tablet Take 1 tablet by mouth daily. 1 Package 11  . pantoprazole (PROTONIX) 40 MG tablet Take 1 tablet (40 mg total) by mouth daily. 90 tablet 0  . valACYclovir (VALTREX) 1000 MG tablet TAKE 2 TABLETS BY MOUTH NOW AND  AGAIN  IN  12  HOURS 4 tablet 0   No current facility-administered medications on file prior to visit.   Allergies  Allergen Reactions  . Adhesive [Tape] Itching and Rash    Blistering of skin     Family History  Problem Relation Age of Onset  . Arrhythmia Maternal Grandmother   . Hypertension Maternal Grandfather   . Diabetes Maternal Grandfather   . Heart attack Maternal Grandfather   . Heart disease Maternal Grandfather   . Heart murmur Mother   . Melanoma Mother        stage 3 malignant melanoma  . Healthy Father   . Healthy Brother   .  Scoliosis Brother   . Other Maternal Aunt        TACHYCARDIA  . Diabetes Paternal Grandfather   . Arrhythmia Other        Aunt w/ tachyarrythmia at age 5  . Cancer Other   . Stomach cancer Other   . Colon cancer Neg Hx   . Esophageal cancer Neg Hx   . Rectal cancer Neg Hx    PE: BP 120/76   Pulse 98   Ht 5\' 4"  (1.626 m)   Wt 172 lb 3.2 oz (78.1 kg)   SpO2 98%   BMI 29.56 kg/m  Wt Readings from Last 3 Encounters:  12/23/20 172 lb 3.2 oz (78.1 kg)  11/17/20 172 lb (78 kg)  09/28/20 172 lb (78 kg)   Constitutional: Slightly overweight, in NAD Eyes: PERRLA, EOMI, no exophthalmos ENT: moist mucous membranes, no neck masses palpated, thyroidectomy scar without keloid, no cervical lymphadenopathy Cardiovascular: tachycardia, RR, No MRG Respiratory: CTA B Gastrointestinal: abdomen soft, NT, ND, BS+ Musculoskeletal: no deformities, strength intact in all 4 Skin: moist, warm, no rashes Neurological: no tremor with outstretched hands, DTR normal in all 4  ASSESSMENT: 1.  Papillary thyroid cancer -Multifocal, subcentimeter  2. S/p right thyroid  lobectomy  3. Subclinical hyperthyroidism  PLAN: 1.  Papillary thyroid cancer -Reviewed the images of her previous thyroid ultrasound and also previous investigation and treatment.  She has a history of thyroid nodules, of which the dominant nodule was fairly large, hypoechoic, without other worrisome characteristics.  The nodule appears to be hot on a thyroid uptake and scan.  However, on her ultrasound from 12/01/2019, she also had a small, right inferior thyroid nodule, measuring 1 x 0.9 x 0.7 cm, which was solid, hypoechoic, and with microcalcifications.  We biopsied this nodule with inconclusive results on 01/27/2020, however, DL for molecular marker returned suspicious.  Therefore, I referred her to surgery for hemithyroidectomy.  Final pathology showed papillary thyroid cancer, multifocal, with the largest focus of 0.6 cm, but without lymphovascular extension or other worrisome characteristics.  Therefore, since she has stage I, low risk, thyroid cancer and the cancer was very small, I did not suggest completion thyroidectomy and RAI treatment. We will continue to follow her with neck ultrasounds.  I plan to repeat another ultrasound in 6 months. -Thyroglobulin measurement is not indicated anymore in follow-up for patients undergoing hemithyroidectomy without RAI treatment -At today's visit, we will we will recheck her TFTs -I will see her back in 1 year but in 6 months for labs  2.  S/p right thyroid lobectomy -she did have a keloid at last visit so I advised her to use kelo-cote strips.  These worked very well for her so as of now, she has no keloid at the scar site -No pain or dysesthesia at the surgical site -We will recheck her TFTs today to see if she needs levothyroxine supplementation -We discussed that approximately 25% of patients may require levothyroxine after hemithyroidectomy  3. Subclinical hyperthyroidism -This appears to have resolved after her thyroid surgery -We will  recheck her TFTs now  Component     Latest Ref Rng & Units 12/23/2020  TSH     0.35 - 4.50 uIU/mL 2.21  T4,Free(Direct)     0.60 - 1.60 ng/dL 0.70  Triiodothyronine,Free,Serum     2.3 - 4.2 pg/mL 3.3  TFTs are normal.  We will recheck them in 6 months.  Philemon Kingdom, MD PhD [facility] Endocrinology

## 2020-12-23 NOTE — Patient Instructions (Addendum)
Please stop at the lab.  Please come back for a follow-up appointment in 1 year but for labs and a neck ultrasound in 6 months.

## 2021-01-24 ENCOUNTER — Encounter: Payer: Self-pay | Admitting: Gastroenterology

## 2021-01-24 ENCOUNTER — Telehealth: Payer: Self-pay

## 2021-01-24 ENCOUNTER — Ambulatory Visit (INDEPENDENT_AMBULATORY_CARE_PROVIDER_SITE_OTHER): Payer: BC Managed Care – PPO | Admitting: Gastroenterology

## 2021-01-24 VITALS — BP 116/60 | HR 72 | Ht 64.57 in | Wt 169.5 lb

## 2021-01-24 DIAGNOSIS — R12 Heartburn: Secondary | ICD-10-CM | POA: Diagnosis not present

## 2021-01-24 DIAGNOSIS — R198 Other specified symptoms and signs involving the digestive system and abdomen: Secondary | ICD-10-CM

## 2021-01-24 DIAGNOSIS — K2210 Ulcer of esophagus without bleeding: Secondary | ICD-10-CM | POA: Diagnosis not present

## 2021-01-24 DIAGNOSIS — K2970 Gastritis, unspecified, without bleeding: Secondary | ICD-10-CM | POA: Diagnosis not present

## 2021-01-24 DIAGNOSIS — K2990 Gastroduodenitis, unspecified, without bleeding: Secondary | ICD-10-CM

## 2021-01-24 MED ORDER — METAMUCIL 28.3 % PO POWD: ORAL | 0 refills | Status: DC

## 2021-01-24 MED ORDER — PANTOPRAZOLE SODIUM 40 MG PO TBEC: 40.0000 mg | DELAYED_RELEASE_TABLET | Freq: Two times a day (BID) | ORAL | 3 refills | Status: DC

## 2021-01-24 NOTE — Progress Notes (Signed)
Referring Provider: Debbrah Alar, NP Primary Care Physician:  Debbrah Alar, NP  Reason for Consultation:  Change in bowel habits   IMPRESSION:  H pylori negative gastritis and duodenitis on EGD  11/17/20    - was having daily heartburn not responding to Pepcid and TUMS Suspected IBS with alternating bowel habits with abdominal pain, nausea and bloating    - duodenal biopsies negative for celiac Normal TSH following recent thyroidectomy  H pylori negative gastritis and duodenitis on EGD  11/17/20: Continue pantoprazole 40 mg BID. Avoid NSAIDs.  Suspected irritable bowel syndrome: Will add psyllium for symptom stabilization. Discussed low FODMAP diet. Brochure provided. She will consider this option, but notes that she loves food too much. Uninterested in a trial of dairy free for the same reasons. Must consider  IBS masqueraders such as food intolerance (lactose, fructose, sucrose), SIBO, if symptoms are not improving.    PLAN: Pantoprazole 40 mg BID- refilled today Trial of Metamucil starting every morning Briefly discussed lowFODMAP diet Low threshold to add dicyclomine Follow-up in 3-4 months, earlier as needed  Please see the "Patient Instructions" section for addition details about the plan.  HPI: Jenna [name] is a 30 y.o. female who returns in follow-up for further evaluation of altered bowel habits. The interval history is obtained through the patient and review of her electronic health record. 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  She builds swimming pools.   History of altered bowels - predominantly constipation -  as a child attributed to stress.  Almost one year history of change in bowel habits with alternating urgent diarrhea and constipation. She has diffuse abdominal pain, with nausea, bloating, early satiety.  Noted distension after eating in the morning, even after drinking  a glass of water. Occasional nocturnal symptoms. Rare rectal pain that she wonders if it's gas build up. Follows a diet with little dairy. No mammalian meat triggers. Primarily eats chicken.  No symptoms with consumption of carbonated beverages, caffeinated beverages, lactose, gluten, fructose or other gas-producing foods such as legumes, onions, cabbage, brussel sprouts, artificial sweeteners or sugar substitutes.   CT abd/pelvis with contrast 01/04/18: 4.5 cm right ovarian/adnexal cyst. No other abnormalities.  TSH 3.78 06/17/20 Normal CBC 05/19/20  EGD 11/17/2020: Esophageal ulcers and gastritis; biopsies confirmed erosion, ulcer in the stomach and peptic duodenitis Colonoscopy 11/17/2020: Normal  Returns in follow-up today after starting pantoprazole 40 mg BID with dramatic improvement in her abominal pain and indigestion. Continues to have altered bowel habits, although it's not nearly as severe as it was before.   Past Medical History:  Diagnosis Date  . Anxiety   . Chronic diarrhea   . Cyst of ovary 12/2017  . Depression   . Dysrhythmia    PVCs  . GERD (gastroesophageal reflux disease)   . Headache    stress related  . Heart murmur    since childhood  . Papillary thyroid carcinoma (HCC)    nodules on left lobe still- right side removed  . Rapid palpitations 07/08/2015  . SVT (supraventricular tachycardia) ([place])    last episode- 2 years ago  . Vasovagal syncope 2011   no since then    Past Surgical History:  Procedure Laterality Date  . THYROID LOBECTOMY Right 05/27/2020   Procedure: RIGHT THYROID LOBECTOMY;  Surgeon: Armandina Gemma, MD;  Location: [facility];  Service: General;  Laterality: Right;  . WISDOM TOOTH EXTRACTION      Current  Outpatient Medications  Medication Sig Dispense Refill  . norgestimate-ethinyl estradiol (SPRINTEC 28) 0.25-35 MG-MCG tablet Take 1 tablet by mouth daily. 1 Package 11  . pantoprazole (PROTONIX) 40 MG tablet Take 1 tablet (40 mg total) by mouth  daily. 90 tablet 0  . valACYclovir (VALTREX) 1000 MG tablet TAKE 2 TABLETS BY MOUTH NOW AND  AGAIN  IN  12  HOURS (Patient not taking: Reported on 01/24/2021) 4 tablet 0   No current facility-administered medications for this visit.    Allergies as of 01/24/2021 - Review Complete 01/24/2021  Allergen Reaction Noted  . Adhesive [tape] Itching and Rash 11/17/2016    Family History  Problem Relation Age of Onset  . Arrhythmia Maternal Grandmother   . Hypertension Maternal Grandfather   . Diabetes Maternal Grandfather   . Heart attack Maternal Grandfather   . Heart disease Maternal Grandfather   . Heart murmur Mother   . Melanoma Mother        stage 3 malignant melanoma  . Healthy Father   . Healthy Brother   . Scoliosis Brother   . Other Maternal Aunt        TACHYCARDIA  . Diabetes Paternal Grandfather   . Arrhythmia Other        Aunt w/ tachyarrythmia at age 36  . Cancer Other   . Stomach cancer Other   . Colon cancer Neg Hx   . Esophageal cancer Neg Hx   . Rectal cancer Neg Hx     Social History   Socioeconomic History  . Marital status: Single    Spouse name: Not on file  . Number of children: Not on file  . Years of education: Not on file  . Highest education level: Not on file  Occupational History  . Not on file  Tobacco Use  . Smoking status: Current Every Day Smoker    Packs/day: 0.50    Years: 10.00    Pack years: 5.00    Types: Cigarettes  . Smokeless tobacco: Never Used  . Tobacco comment: 7 cigarettes daily  Vaping Use  . Vaping Use: Former  . Start date: 05/20/2015  . Quit date: 05/19/2017  . Substances: Nicotine, Flavoring  Substance and Sexual Activity  . Alcohol use: Yes    Alcohol/week: 1.0 standard drink    Types: 1 Standard drinks or equivalent per week    Comment: 2 beers/week  . Drug use: No  . Sexual activity: Yes    Partners: Male    Birth control/protection: Pill    Comment: Patient declined to answer sexual history.  Other Topics  Concern  . Not on file  Social History Narrative   Lives in [place] with her mother, Jenna [name], and her brother, Jenna [name].  Identifies as White and [place]. Endorses tobacco use as well as rare THC use. Denies ETOH use. + sexually active. Has implanon.     GED   Installs pools   Social Determinants of Health   Financial Resource Strain: Not on file  Food Insecurity: Not on file  Transportation Needs: Not on file  Physical Activity: Not on file  Stress: Not on file  Social Connections: Not on file  Intimate Partner Violence: Not on file     Physical Exam: General:   Alert,  well-nourished, pleasant and cooperative in NAD Head:  Normocephalic and atraumatic. Eyes:  Sclera clear, no icterus.   Conjunctiva pink. Abdomen:  Soft, nontender, nondistended, normal bowel sounds, no rebound or guarding. No hepatosplenomegaly.  Neurologic:  Alert and  oriented x4;  grossly nonfocal Skin:  Intact without significant lesions or rashes. Psych:  Alert and cooperative. Normal mood and affect.    Hao Dion L. Tarri Glenn, MD, MPH 01/24/2021, 9:37 AM

## 2021-01-24 NOTE — Telephone Encounter (Signed)
PRIOR AUTHORIZATION  PA initiation date: 01/24/21  Medication: Pantoprazole Insurance Company: [employer] completed electronically through [employer] My Meds: Yes  APPROVAL  Medication: Pantoprazole Insurance Company: [employer]BS PA response: Approved Approval dates: 01/24/21 through 01/23/22  Jenna [name] (Key: [id number])  This request has received a Favorable outcome from [place].  Please keep in mind this is not a guarantee of payment. Eligibility and Benefit determinations will be made at the time of service.  Please note any additional information provided by [facility] at the bottom of the screen.  Jenna [name] (KeyPercell Belt) Rx #: [id number] Pantoprazole Sodium 40MG  dr tablets   Form [employer] [place] Commercial Electronic Request Form (CB)  PA Status for Patients  Created 5 hours ago Sent to Plan 26 minutes ago Plan Response 26 minutes ago Submit Clinical Questions 2 minutes ago Determination Favorable 2 minutes ago Message from Plan Effective from 01/24/2021 through 01/23/2022.

## 2021-01-24 NOTE — Patient Instructions (Addendum)
RECOMMENDATION(S):    I am providing you with a FODMAP diet brochure for your review.  PRESCRIPTION MEDICATION(S):   We have sent the following medication(s) to your pharmacy:  . Pantoprazole - Please take 40mg  by mouth twice daily  . NOTE: If your medication(s) requires a PRIOR AUTHORIZATION, we will receive notification from your pharmacy. Once received, the process to submit for approval may take up to 7-10 business days. You will be contacted about any denials we have received from your insurance company as well as alternatives recommended by your provider.  OVER THE COUNTER MEDICATION(S):   Please purchase the following medications over the counter and take as directed:  Marland Kitchen Metamucil - please take 1 dose by mouth daily for symptomatic relief  FOLLOW UP: Please call the office to schedule a follow up appointment with me in 3-4 months. I am happy to see you earlier if needed.  BMI:  If you are age 50 or younger, your body mass index should be between 19-25. Your Body mass index is 28.59 kg/m. If this is out of the aformentioned range listed, please consider follow up with your Primary Care Provider.   Thank you for trusting me with your gastrointestinal care!    Thornton Park, MD, MPH

## 2021-01-26 ENCOUNTER — Other Ambulatory Visit: Payer: Self-pay | Admitting: Family

## 2021-01-26 NOTE — Telephone Encounter (Signed)
Need this prescription before a chemical peel. Esthetician would like me to take this before appointment.   Requesting refill of Valtrex. See Pt message above.

## 2021-01-27 MED ORDER — VALACYCLOVIR HCL 1 G PO TABS: ORAL_TABLET | ORAL | 0 refills | Status: DC

## 2021-02-07 ENCOUNTER — Other Ambulatory Visit: Payer: Self-pay | Admitting: Family

## 2021-02-07 MED ORDER — VALACYCLOVIR HCL 1 G PO TABS: ORAL_TABLET | ORAL | 3 refills | Status: DC

## 2021-03-01 ENCOUNTER — Other Ambulatory Visit: Payer: Self-pay

## 2021-03-01 MED ORDER — NORGESTIMATE-ETH ESTRADIOL 0.25-35 MG-MCG PO TABS: 1.0000 | ORAL_TABLET | Freq: Every day | ORAL | 3 refills | Status: DC

## 2021-07-04 ENCOUNTER — Other Ambulatory Visit: Payer: Self-pay | Admitting: Internal Medicine

## 2021-07-04 ENCOUNTER — Encounter: Payer: Self-pay | Admitting: Internal Medicine

## 2021-07-06 ENCOUNTER — Other Ambulatory Visit (INDEPENDENT_AMBULATORY_CARE_PROVIDER_SITE_OTHER): Payer: BC Managed Care – PPO

## 2021-07-06 DIAGNOSIS — Z9889 Other specified postprocedural states: Secondary | ICD-10-CM

## 2021-07-06 LAB — T4, FREE: Free T4: 0.68 ng/dL (ref 0.60–1.60)

## 2021-07-06 LAB — TSH: TSH: 1.46 u[IU]/mL (ref 0.35–5.50)

## 2021-07-06 LAB — T3, FREE: T3, Free: 3.6 pg/mL (ref 2.3–4.2)

## 2021-07-11 ENCOUNTER — Encounter: Payer: Self-pay | Admitting: Obstetrics & Gynecology

## 2021-07-11 ENCOUNTER — Encounter: Payer: BC Managed Care – PPO | Admitting: Obstetrics & Gynecology

## 2021-07-11 ENCOUNTER — Other Ambulatory Visit: Payer: Self-pay

## 2021-07-11 ENCOUNTER — Other Ambulatory Visit: Payer: Self-pay | Admitting: Obstetrics & Gynecology

## 2021-07-11 NOTE — Progress Notes (Signed)
Pt not seen. Too early for annual.   Clh-S

## 2021-07-12 ENCOUNTER — Other Ambulatory Visit: Payer: BC Managed Care – PPO

## 2021-07-18 ENCOUNTER — Ambulatory Visit: Payer: BC Managed Care – PPO | Admitting: Obstetrics & Gynecology

## 2021-08-09 ENCOUNTER — Other Ambulatory Visit: Payer: Self-pay

## 2021-08-09 ENCOUNTER — Ambulatory Visit (INDEPENDENT_AMBULATORY_CARE_PROVIDER_SITE_OTHER): Payer: BC Managed Care – PPO | Admitting: Advanced Practice Midwife

## 2021-08-09 ENCOUNTER — Encounter: Payer: Self-pay | Admitting: Advanced Practice Midwife

## 2021-08-09 VITALS — BP 119/53 | HR 68 | Ht 64.0 in

## 2021-08-09 DIAGNOSIS — Z01419 Encounter for gynecological examination (general) (routine) without abnormal findings: Secondary | ICD-10-CM | POA: Diagnosis not present

## 2021-08-09 MED ORDER — NORGESTIMATE-ETH ESTRADIOL 0.25-35 MG-MCG PO TABS: 1.0000 | ORAL_TABLET | Freq: Every day | ORAL | 3 refills | Status: DC

## 2021-08-09 NOTE — Progress Notes (Signed)
GYNECOLOGY ANNUAL PREVENTATIVE CARE ENCOUNTER NOTE  Subjective:   Jenna [name] is a 30 y.o. [id number] female here for a routine annual gynecologic exam.  Current complaints: none.   Denies abnormal vaginal bleeding, discharge, pelvic pain, problems with intercourse or other gynecologic concerns.  Doing well on Sprintec   Wants children in the future.  In monogamous relationship, declines STD testing  Patient Active Problem List   Diagnosis Date Noted   H/O partial thyroidectomy 05/27/2020   Papillary thyroid carcinoma ([place]) 05/15/2020   Multiple thyroid nodules 05/15/2020   Hyperthyroidism, subclinical 05/15/2020   Vaginitis 12/03/2015   Vasovagal syncope 12/03/2015   Migraines 12/03/2015   Rapid palpitations 07/08/2015   Screening examination for sexually transmitted disease 10/16/2014   Tattoo of skin 10/26/2011   Anxiety 09/22/2011   Followed closely by Endocrinology for Thyroid cancer.  Partial Thyroidectomy.  States labs are normal  Has had recent GI workup for IBS   Gynecologic History Patient's last menstrual period was 07/19/2021. Contraception: OCP (estrogen/progesterone) Last Pap: 07/15/2020. Results were: normal Last mammogram: never   Obstetric History OB History  Gravida Para Term Preterm AB Living  1       1    SAB IAB Ectopic Multiple Live Births  1            # Outcome Date GA Lbr Len/2nd Weight Sex Delivery Anes PTL Lv  1 SAB 2018            Past Medical History:  Diagnosis Date   Anxiety    Chronic diarrhea    Cyst of ovary 12/2017   Depression    Dysrhythmia    PVCs   GERD (gastroesophageal reflux disease)    Headache    stress related   Heart murmur    since childhood   Papillary thyroid carcinoma (HCC)    nodules on left lobe still- right side removed   Rapid palpitations 07/08/2015   SVT (supraventricular tachycardia) ([place])    last episode- 2 years ago   Vasovagal syncope 2011   no since then    Past Surgical History:  Procedure  Laterality Date   THYROID LOBECTOMY Right 05/27/2020   Procedure: RIGHT THYROID LOBECTOMY;  Surgeon: Armandina Gemma, MD;  Location: [facility];  Service: General;  Laterality: Right;   WISDOM TOOTH EXTRACTION      Current Outpatient Medications on File Prior to Visit  Medication Sig Dispense Refill   norgestimate-ethinyl estradiol ([place] 28) 0.25-35 MG-MCG tablet Take 1 tablet by mouth daily. 84 tablet 3   pantoprazole (PROTONIX) 40 MG tablet Take 1 tablet (40 mg total) by mouth 2 (two) times daily. 180 tablet 3   valACYclovir (VALTREX) 1000 MG tablet Take 2 tabs by mouth now and again in 12 hrs 4 tablet 3   Psyllium (METAMUCIL) 28.3 % POWD Take 1 dose by mouth qd; WILL PURCHASE OTC (Patient not taking: Reported on 08/09/2021) 575 g 0   No current facility-administered medications on file prior to visit.    Allergies  Allergen Reactions   Adhesive [Tape] Itching and Rash    Blistering of skin      Social History   Socioeconomic History   Marital status: Single    Spouse name: Not on file   Number of children: Not on file   Years of education: Not on file   Highest education level: Some college, no degree  Occupational History   Not on file  Tobacco Use   Smoking status: Every Day  Packs/day: 0.50    Years: 10.00    Pack years: 5.00    Types: Cigarettes   Smokeless tobacco: Never   Tobacco comments:    7 cigarettes daily  Vaping Use   Vaping Use: Former   Start date: 05/20/2015   Quit date: 05/19/2017   Substances: Nicotine, Flavoring  Substance and Sexual Activity   Alcohol use: Yes    Alcohol/week: 1.0 standard drink    Types: 1 Standard drinks or equivalent per week    Comment: 2 beers/week   Drug use: No   Sexual activity: Yes    Partners: Male    Birth control/protection: Pill    Comment: Patient declined to answer sexual history.  Other Topics Concern   Not on file  Social History Narrative   Lives in [place] with her mother, Shailene Brittingham, and her brother, Lamount Cranker.  Identifies as White and [place]. Endorses tobacco use as well as rare THC use. Denies ETOH use. + sexually active. Has implanon.     GED   Installs pools   Social Determinants of Health   Financial Resource Strain: Not on file  Food Insecurity: Not on file  Transportation Needs: Not on file  Physical Activity: Not on file  Stress: Not on file  Social Connections: Not on file  Intimate Partner Violence: Not on file    Family History  Problem Relation Age of Onset   Arrhythmia Maternal Grandmother    Hypertension Maternal Grandfather    Diabetes Maternal Grandfather    Heart attack Maternal Grandfather    Heart disease Maternal Grandfather    Heart murmur Mother    Melanoma Mother        stage 3 malignant melanoma   Healthy Father    Healthy Brother    Scoliosis Brother    Other Maternal Aunt        TACHYCARDIA   Diabetes Paternal Grandfather    Arrhythmia Other        Aunt w/ tachyarrythmia at age 46   Cancer Other    Stomach cancer Other    Colon cancer Neg Hx    Esophageal cancer Neg Hx    Rectal cancer Neg Hx     The following portions of the patient's history were reviewed and updated as appropriate: allergies, current medications, past family history, past medical history, past social history, past surgical history and problem list.  Review of Systems Pertinent items noted in HPI and remainder of comprehensive ROS otherwise negative.   Objective:  BP (!) 119/53   Pulse 68   Ht '5\' 4"'$  (1.626 m)   LMP 07/19/2021   BMI 29.52 kg/m  CONSTITUTIONAL: Well-developed, well-nourished female in no acute distress.  HENT:  Normocephalic, atraumatic, External right and left ear normal. Oropharynx is clear and moist EYES: Conjunctivae and EOM are normal. No scleral icterus.  NECK: Normal range of motion, supple, no masses.  Normal thyroid. Healed Incision SKIN: Skin is warm and dry. No rash noted. Not diaphoretic. No erythema. No pallor. NEUROLOGIC: Alert  and oriented to person, place, and time. Normal muscle tone coordination. No cranial nerve deficit noted. PSYCHIATRIC: Normal mood and affect. Normal behavior. Normal judgment and thought content. CARDIOVASCULAR: Normal heart rate noted, regular rhythm RESPIRATORY: Clear to auscultation bilaterally. Effort and breath sounds normal, no problems with respiration noted. BREASTS: Symmetric in size. No masses, skin changes, nipple drainage, or lymphadenopathy. ABDOMEN: Soft, normal bowel sounds, no distention noted.  No tenderness, rebound or guarding.  PELVIC: Normal appearing external genitalia; normal appearing vaginal mucosa and cervix.  No abnormal discharge noted.  Pap smear obtained.  Normal uterine size, no other palpable masses, no uterine or adnexal tenderness. MUSCULOSKELETAL: Normal range of motion. No tenderness.  No cyanosis, clubbing, or edema.  2+ distal pulses.   Assessment:  Annual gynecologic examination  Doing well on oral contraceptives S/P partial thyroidectomy for papillary thyroid carcinoma   Plan:  Routine preventative health maintenance measures emphasized. Followup with Endocrinology Continue Sprintec Please refer to After Visit Summary for other counseling recommendations.

## 2021-09-05 ENCOUNTER — Other Ambulatory Visit: Payer: Self-pay

## 2021-09-05 ENCOUNTER — Emergency Department (HOSPITAL_COMMUNITY): Payer: BC Managed Care – PPO

## 2021-09-05 ENCOUNTER — Encounter (HOSPITAL_COMMUNITY): Payer: Self-pay | Admitting: Emergency Medicine

## 2021-09-05 ENCOUNTER — Emergency Department (HOSPITAL_COMMUNITY)
Admission: EM | Admit: 2021-09-05 | Discharge: 2021-09-05 | Disposition: A | Discharge status: Home / Self Care | Payer: BC Managed Care – PPO | Attending: Emergency Medicine | Admitting: Emergency Medicine

## 2021-09-05 ENCOUNTER — Telehealth: Payer: Self-pay

## 2021-09-05 DIAGNOSIS — F17210 Nicotine dependence, cigarettes, uncomplicated: Secondary | ICD-10-CM | POA: Diagnosis not present

## 2021-09-05 DIAGNOSIS — Z85850 Personal history of malignant neoplasm of thyroid: Secondary | ICD-10-CM | POA: Insufficient documentation

## 2021-09-05 DIAGNOSIS — R079 Chest pain, unspecified: Secondary | ICD-10-CM | POA: Diagnosis not present

## 2021-09-05 DIAGNOSIS — R42 Dizziness and giddiness: Secondary | ICD-10-CM | POA: Insufficient documentation

## 2021-09-05 DIAGNOSIS — R0789 Other chest pain: Secondary | ICD-10-CM | POA: Diagnosis not present

## 2021-09-05 LAB — CBC WITH DIFFERENTIAL/PLATELET
Abs Immature Granulocytes: 0.03 10*3/uL (ref 0.00–0.07)
Basophils Absolute: 0 10*3/uL (ref 0.0–0.1)
Basophils Relative: 0 %
Eosinophils Absolute: 0.1 10*3/uL (ref 0.0–0.5)
Eosinophils Relative: 1 %
HCT: 40.9 % (ref 36.0–46.0)
Hemoglobin: 13.5 g/dL (ref 12.0–15.0)
Immature Granulocytes: 0 %
Lymphocytes Relative: 31 %
Lymphs Abs: 2.1 10*3/uL (ref 0.7–4.0)
MCH: 30.1 pg (ref 26.0–34.0)
MCHC: 33 g/dL (ref 30.0–36.0)
MCV: 91.1 fL (ref 80.0–100.0)
Monocytes Absolute: 0.4 10*3/uL (ref 0.1–1.0)
Monocytes Relative: 5 %
Neutro Abs: 4.2 10*3/uL (ref 1.7–7.7)
Neutrophils Relative %: 63 %
Platelets: 246 10*3/uL (ref 150–400)
RBC: 4.49 MIL/uL (ref 3.87–5.11)
RDW: 11.7 % (ref 11.5–15.5)
WBC: 6.8 10*3/uL (ref 4.0–10.5)
nRBC: 0 % (ref 0.0–0.2)

## 2021-09-05 LAB — COMPREHENSIVE METABOLIC PANEL
ALT: 24 U/L (ref 0–44)
AST: 21 U/L (ref 15–41)
Albumin: 4.1 g/dL (ref 3.5–5.0)
Alkaline Phosphatase: 45 U/L (ref 38–126)
Anion gap: 10 (ref 5–15)
BUN: 9 mg/dL (ref 6–20)
CO2: 24 mmol/L (ref 22–32)
Calcium: 9.7 mg/dL (ref 8.9–10.3)
Chloride: 103 mmol/L (ref 98–111)
Creatinine, Ser: 0.72 mg/dL (ref 0.44–1.00)
GFR, Estimated: >60 mL/min (ref >60)
Glucose, Bld: 100 mg/dL — ABNORMAL HIGH (ref 70–99)
Potassium: 4.3 mmol/L (ref 3.5–5.1)
Sodium: 137 mmol/L (ref 135–145)
Total Bilirubin: 0.4 mg/dL (ref 0.3–1.2)
Total Protein: 7.2 g/dL (ref 6.5–8.1)

## 2021-09-05 LAB — TSH: TSH: 1.094 u[IU]/mL (ref 0.350–4.500)

## 2021-09-05 LAB — I-STAT BETA HCG BLOOD, ED (MC, WL, AP ONLY): I-stat hCG, quantitative: <5 m[IU]/mL (ref <5)

## 2021-09-05 LAB — TROPONIN I (HIGH SENSITIVITY)
Troponin I (High Sensitivity): <2 ng/L (ref <18)
Troponin I (High Sensitivity): <2 ng/L (ref <18)

## 2021-09-05 NOTE — Telephone Encounter (Signed)
Nurse Assessment Nurse: Hardin Negus, RN, Mardene Celeste Date/Time Jenna [name] Time): 09/05/2021 11:18:16 AM Confirm and document reason for call. If symptomatic, describe symptoms. ---She started having chest pain on Saturday. The pain has become more intense. She is taking deeper breaths and that is when she feels the pain. No fever. 131/79 on Saturday HR 43 at rest 143/87 ,2 HX of SVTS Does the patient have any new or worsening symptoms? ---Yes Will a triage be completed? ---Yes Related visit to physician within the last 2 weeks? ---No Does the PT have any chronic conditions? (i.e. diabetes, asthma, this includes High risk factors for pregnancy, etc.) ---Yes List chronic conditions. ---cardiac issues, Is the patient pregnant or possibly pregnant? (Ask all females between the ages of 44-55) ---No Is this a behavioral health or substance abuse call? ---No Guidelines Guideline Title Affirmed Question Affirmed Notes Nurse Date/Time Jenna [name] Time) Chest Pain SEVERE chest pain Oren Bracket 09/05/2021 11:21:11 AM Disp. Time Jenna [name] Time) Disposition Final User 09/05/2021 11:17:03 AM Send to Urgent Queue Lonia Farber PLEASE NOTE: All timestamps contained within this report are represented as [place] Standard Time. CONFIDENTIALTY NOTICE: This fax transmission is intended only for the addressee. It contains information that is legally privileged, confidential or otherwise protected from use or disclosure. If you are not the intended recipient, you are strictly prohibited from reviewing, disclosing, copying using or disseminating any of this information or taking any action in reliance on or regarding this information. If you have received this fax in error, please notify us immediately by telephone so that we can arrange for its return to [place]. Phone: [phone], Toll-Free: [phone], Fax: [phone] Page: 2 of 2 Call Id: [id number] 09/05/2021 11:25:46 AM Go to ED Now Yes Hardin Negus, RN,  Lenox Ponds Disagree/Comply Comply Caller Understands Yes PreDisposition Call Doctor Care Advice Given Per Guideline GO TO ED NOW: * Do not eat or drink anything for now. NOTHING BY MOUTH: CALL EMS IF: CARE ADVICE given per Chest Pain (Adult) guideline. Referrals [facility] - ED  Pt currently at ED.

## 2021-09-05 NOTE — ED Provider Notes (Addendum)
Emergency Medicine Provider Triage Evaluation Note  Jenna [name] , a 30 y.o. female  was evaluated in triage.  Pt complains of chest pain since two days ago.  Started as intermittent now sharp and constant left sided.  Did have light headed and diaphoresis on Saturday with a HR in the low 40s while laying in bed at rest.  She has a history of thyroid cancer, isn't on any meds for it.   Compliant with metoprolol for SVT, no recent dose changes.  .  Review of Systems  Positive: Chest pain, diaphoresis Negative: fever  Physical Exam  BP 126/78 (BP Location: Left Arm)   Pulse 77   Temp 99 F (37.2 C) (Oral)   Resp 16   Ht '5\' 4"'$  (1.626 m)   Wt 77.1 kg   LMP 08/17/2021 (Exact Date)   SpO2 99%   BMI 29.18 kg/m  Gen:   Awake, no distress   Resp:  Normal effort  MSK:   Moves extremities without difficulty  Other:  Speech is not slurred.   Medical Decision Making  Medically screening exam initiated at 12:26 PM.  Appropriate orders placed.  Jenna [name] was informed that the remainder of the evaluation will be completed by another provider, this initial triage assessment does not replace that evaluation, and the importance of remaining in the ED until their evaluation is complete.     Lorin Glass, PA-C 09/05/21 1228    Lorin Glass, PA-C 09/05/21 1233    Lucrezia Starch, MD 09/12/21 [phone]

## 2021-09-05 NOTE — ED Provider Notes (Signed)
[facility] EMERGENCY DEPARTMENT Provider Note   CSN: [id number] Arrival date & time: 09/05/21  1216     History Chief Complaint  Patient presents with   Chest Pain    Jenna [name] is a 30 y.o. female.   Chest Pain Associated symptoms: no abdominal pain, no back pain, no cough, no shortness of breath and no weakness   Patient Zentz with chest pain.  Has had for last couple days.  Initially sharp.  Epigastric to left chest.  Comes and goes.  Not exertional.  States it may get worse with some breaths but does not feel short of breath.  No swelling or leg.  Goes away completely in between.  States has been more constant today.  No swelling her legs.  Did recently go to the beach.  States it started while she was there.  It is around a 4-hour drive.  Patient does smoke.  Is doing post control.  No fevers.  No hemoptysis.  Denies pregnancy. States she has felt her heart go slow at times.  Will occasionally feel a little dizzy.  Has a history of SVT.  Has the patient states propranolol to take as needed but states she has not taken it.    Past Medical History:  Diagnosis Date   Anxiety    Chronic diarrhea    Cyst of ovary 12/2017   Depression    Dysrhythmia    PVCs   GERD (gastroesophageal reflux disease)    Headache    stress related   Heart murmur    since childhood   Papillary thyroid carcinoma (HCC)    nodules on left lobe still- right side removed   Rapid palpitations 07/08/2015   SVT (supraventricular tachycardia) ([place])    last episode- 2 years ago   Vasovagal syncope 2011   no since then    Patient Active Problem List   Diagnosis Date Noted   H/O partial thyroidectomy 05/27/2020   Papillary thyroid carcinoma ([place]) 05/15/2020   Multiple thyroid nodules 05/15/2020   Hyperthyroidism, subclinical 05/15/2020   Vaginitis 12/03/2015   Vasovagal syncope 12/03/2015   Migraines 12/03/2015   Rapid palpitations 07/08/2015   Screening examination for  sexually transmitted disease 10/16/2014   Tattoo of skin 10/26/2011   Anxiety 09/22/2011    Past Surgical History:  Procedure Laterality Date   THYROID LOBECTOMY Right 05/27/2020   Procedure: RIGHT THYROID LOBECTOMY;  Surgeon: Armandina Gemma, MD;  Location: [facility];  Service: General;  Laterality: Right;   WISDOM TOOTH EXTRACTION       OB History     Gravida  1   Para      Term      Preterm      AB  1   Living         SAB  1   IAB      Ectopic      Multiple      Live Births              Family History  Problem Relation Age of Onset   Arrhythmia Maternal Grandmother    Hypertension Maternal Grandfather    Diabetes Maternal Grandfather    Heart attack Maternal Grandfather    Heart disease Maternal Grandfather    Heart murmur Mother    Melanoma Mother        stage 3 malignant melanoma   Healthy Father    Healthy Brother    Scoliosis Brother  Other Maternal Aunt        TACHYCARDIA   Diabetes Paternal Grandfather    Arrhythmia Other        Aunt w/ tachyarrythmia at age 57   Cancer Other    Stomach cancer Other    Colon cancer Neg Hx    Esophageal cancer Neg Hx    Rectal cancer Neg Hx     Social History   Tobacco Use   Smoking status: Every Day    Packs/day: 0.50    Years: 10.00    Pack years: 5.00    Types: Cigarettes   Smokeless tobacco: Never   Tobacco comments:    7 cigarettes daily  Vaping Use   Vaping Use: Former   Start date: 05/20/2015   Quit date: 05/19/2017   Substances: Nicotine, Flavoring  Substance Use Topics   Alcohol use: Yes    Alcohol/week: 1.0 standard drink    Types: 1 Standard drinks or equivalent per week    Comment: 2 beers/week   Drug use: No    Home Medications Prior to Admission medications   Medication Sig Start Date End Date Taking? Authorizing Provider  norgestimate-ethinyl estradiol (SPRINTEC 28) 0.25-35 MG-MCG tablet Take 1 tablet by mouth daily. 08/09/21   Seabron Spates, CNM  pantoprazole (PROTONIX)  40 MG tablet Take 1 tablet (40 mg total) by mouth 2 (two) times daily. 01/24/21   Thornton Park, MD  Psyllium (METAMUCIL) 28.3 % POWD Take 1 dose by mouth qd; WILL PURCHASE OTC Patient not taking: Reported on 08/09/2021 01/24/21   Thornton Park, MD  valACYclovir (VALTREX) 1000 MG tablet Take 2 tabs by mouth now and again in 12 hrs 02/07/21   Debbrah Alar, NP    Allergies    Adhesive [tape]  Review of Systems   Review of Systems  Constitutional:  Negative for appetite change.  HENT:  Negative for congestion.   Respiratory:  Negative for cough and shortness of breath.   Cardiovascular:  Positive for chest pain.  Gastrointestinal:  Negative for abdominal pain.  Genitourinary:  Negative for flank pain.  Musculoskeletal:  Negative for back pain.  Skin:  Negative for rash.  Neurological:  Negative for weakness.  Psychiatric/Behavioral:  Negative for confusion.    Physical Exam Updated Vital Signs BP 107/70   Pulse 64   Temp 99 F (37.2 C) (Oral)   Resp 18   Ht 5\' 4"  (1.626 m)   Wt 77.1 kg   LMP 08/17/2021 (Exact Date)   SpO2 98%   BMI 29.18 kg/m   Physical Exam Vitals and nursing note reviewed.  Constitutional:      Appearance: She is well-developed.  Cardiovascular:     Rate and Rhythm: Normal rate and regular rhythm.     Heart sounds: No murmur heard. Pulmonary:     Breath sounds: No wheezing, rhonchi or rales.  Chest:     Chest wall: No tenderness.  Abdominal:     Palpations: There is no splenomegaly.  Musculoskeletal:     Cervical back: Neck supple.     Right lower leg: No tenderness. No edema.     Left lower leg: No tenderness. No edema.  Skin:    General: Skin is warm and dry.     Capillary Refill: Capillary refill takes less than 2 seconds.  Neurological:     Mental Status: She is alert and oriented to person, place, and time.    ED Results / Procedures / Treatments   Labs (all labs  ordered are listed, but only abnormal results are  displayed) Labs Reviewed  COMPREHENSIVE METABOLIC PANEL - Abnormal; Notable for the following components:      Result Value   Glucose, Bld 100 (*)    All other components within normal limits  CBC WITH DIFFERENTIAL/PLATELET  TSH  I-STAT BETA HCG BLOOD, ED ([facility], [facility], [facility] ONLY)  TROPONIN I (HIGH SENSITIVITY)  TROPONIN I (HIGH SENSITIVITY)    EKG EKG Interpretation  Date/Time:  Monday September 05 2021 12:28:39 EDT Ventricular Rate:  59 PR Interval:  144 QRS Duration: 82 QT Interval:  434 QTC Calculation: 429 R Axis:   88 Text Interpretation: Sinus bradycardia Otherwise normal ECG Confirmed by Davonna Belling [phone]) on 09/05/2021 4:00:35 PM  Radiology DG Chest 2 View  Result Date: 09/05/2021 CLINICAL DATA:  Chest pain EXAM: CHEST - 2 VIEW COMPARISON:  2017 FINDINGS: The heart size and mediastinal contours are within normal limits. Both lungs are clear. No pleural effusion or pneumothorax. The visualized skeletal structures are unremarkable. IMPRESSION: No acute process in the chest. Electronically Signed   By: Macy Mis M.D.   On: 09/05/2021 13:13    Procedures Procedures   Medications Ordered in ED Medications - No data to display  ED Course  I have reviewed the triage vital signs and the nursing notes.  Pertinent labs & imaging results that were available during my care of the patient were reviewed by me and considered in my medical decision making (see chart for details).    MDM Rules/Calculators/A&P                           Patient with chest pain.  Anterior chest.  Comes and goes.  Sharp.  Not associated with eating.  No swelling her legs.  Not hypoxic.  No fevers.  Did have recent travel from the beach pain and started before that.  It was only around a 4-hour trip over.  Does smoke and have her control pills.  Doubt cardiac cause.  EKG reassuring.  Troponin negative x2.  Doubt pulmonary embolism.  Not hypoxic.  No leg swelling.  Pain does come and go.  Will  relieve completely in between.  X-ray reassuring.  Discharge home. Final Clinical Impression(s) / ED Diagnoses Final diagnoses:  Nonspecific chest pain    Rx / DC Orders ED Discharge Orders     None        Davonna Belling, MD 09/05/21 1639

## 2021-09-05 NOTE — ED Triage Notes (Signed)
Pt here for cp that started intermittently on saturday while driving home. Pt reports now she is having consistent sharp cp on the L side, no radiation. Endorses lightheadedness and diaphoresis on Saturday, reports HR decreased to 43. Pt hx of SVT, takes metoprolol, advised to come to ER by PCP.

## 2021-10-06 ENCOUNTER — Telehealth: Payer: Self-pay | Admitting: Family

## 2021-10-06 NOTE — Telephone Encounter (Signed)
Pt states she was having chest pain getting progressively worse, transferred to triage to get further advice.

## 2021-10-07 ENCOUNTER — Other Ambulatory Visit: Payer: Self-pay

## 2021-10-07 ENCOUNTER — Telehealth (INDEPENDENT_AMBULATORY_CARE_PROVIDER_SITE_OTHER): Payer: BC Managed Care – PPO | Admitting: Medical

## 2021-10-07 DIAGNOSIS — R0981 Nasal congestion: Secondary | ICD-10-CM

## 2021-10-07 DIAGNOSIS — R051 Acute cough: Secondary | ICD-10-CM | POA: Diagnosis not present

## 2021-10-07 DIAGNOSIS — R0781 Pleurodynia: Secondary | ICD-10-CM

## 2021-10-07 DIAGNOSIS — J40 Bronchitis, not specified as acute or chronic: Secondary | ICD-10-CM

## 2021-10-07 MED ORDER — BENZONATATE 100 MG PO CAPS: 100.0000 mg | ORAL_CAPSULE | Freq: Three times a day (TID) | ORAL | 0 refills | Status: DC | PRN

## 2021-10-07 MED ORDER — AZITHROMYCIN 250 MG PO TABS
ORAL_TABLET | ORAL | 0 refills | Status: AC
Start: 2021-10-07 — End: 2021-10-12

## 2021-10-07 MED ORDER — FLUTICASONE PROPIONATE 50 MCG/ACT NA SUSP: 2.0000 | Freq: Every day | NASAL | 1 refills | Status: DC

## 2021-10-07 NOTE — Patient Instructions (Signed)
Symptoms since Monday could represent bronchitis.  Some intermittent dry and productive cough.  Potential pleuritic pain described.  1 COVID test negative and patient is going to repeat second COVID test today.  Visit was done via video so difficult to determine exact diagnosis.  Approaching weekend so decided to go ahead and prescribe azithromycin antibiotic, Flonase for nasal congestion and benzonatate for cough.  Asked patient to get O2 sat monitor and make sure  levels above 96%.  If it is below this level then call our office to pass a message to me.  Note late in afternoon might not get my chart message until Monday morning.  If signs and symptoms not improving by early next week to recommend an office visit and might need chest x-ray.  Any worsening signs symptoms over the weekend then recommend ED evaluation.  Follow-up 7 days or sooner if needed.

## 2021-10-07 NOTE — Progress Notes (Signed)
   Subjective:    Patient ID: Jenna [name], female    DOB: April 16, 1991, 30 y.o.   MRN: [id number]  HPI  Virtual Visit via Video Note  I connected with Jenna [name] on 10/07/21 at 11:20 AM EDT by a video enabled telemedicine application and verified that I am speaking with the correct person using two identifiers.  Location: Patient: home Provider: office   I discussed the limitations of evaluation and management by telemedicine and the availability of in person appointments. The patient expressed understanding and agreed to proceed.  History of Present Illness:   Pt states around Sunday got some mild occasional cough all day long. Some pain in rt side scapula area pain when cough or takes deep breath. Some left side posterior back/rib area pain as well. Sounds pleuritic. No fever or sweats. Pt felt chill earlier today.  Mild stuffy nose in the morning.    Pt has some mild nausea. LMP- Sep 17, 2021. Normal cycle.  No abdomen pain. Mild nausea occasional. No vomiting.  No calf pain or calf swelling.  No chest pain.   Spring allergies but no fall allergies.  Hx of bronchitis.  No wheezing reported.   Pt did one covid test and it was negative on Monday. Pt has not retest. Observations/Objective:  General-no acute distress, pleasant, oriented. Lungs- on inspection lungs appear unlabored. Neck- no tracheal deviation or jvd on inspection. Neuro- gross motor function appears intact.    Assessment and Plan:  Patient Instructions  Symptoms since Monday could represent bronchitis.  Some intermittent dry and productive cough.  Potential pleuritic pain described.  1 COVID test negative and patient is going to repeat second COVID test today.  Visit was done via video so difficult to determine exact diagnosis.  Approaching weekend so decided to go ahead and prescribe azithromycin antibiotic, Flonase for nasal congestion and benzonatate for cough.  Asked patient to get O2 sat  monitor and make sure  levels above 96%.  If it is below this level then call our office to pass a message to me.  Note late in afternoon might not get my chart message until Monday morning.  If signs and symptoms not improving by early next week to recommend an office visit and might need chest x-ray.  Any worsening signs symptoms over the weekend then recommend ED evaluation.  Follow-up 7 days or sooner if needed.   Mackie Pai, PA-C   Time spent with patient today was  24  minutes which consisted of chart revdiew, discussing diagnosis, work up treatment and documentation.  Follow Up Instructions:    I discussed the assessment and treatment plan with the patient. The patient was provided an opportunity to ask questions and all were answered. The patient agreed with the plan and demonstrated an understanding of the instructions.   The patient was advised to call back or seek an in-person evaluation if the symptoms worsen or if the condition fails to improve as anticipated.     Mackie Pai, PA-C   Review of Systems     Objective:   Physical Exam        Assessment & Plan:

## 2021-10-07 NOTE — Telephone Encounter (Signed)
Patient was seen by Percell Miller today

## 2021-12-28 ENCOUNTER — Encounter: Payer: Self-pay | Admitting: Internal Medicine

## 2021-12-28 ENCOUNTER — Other Ambulatory Visit: Payer: Self-pay

## 2021-12-28 ENCOUNTER — Ambulatory Visit (INDEPENDENT_AMBULATORY_CARE_PROVIDER_SITE_OTHER): Payer: BC Managed Care – PPO | Admitting: Internal Medicine

## 2021-12-28 VITALS — BP 110/68 | HR 89 | Ht 64.0 in | Wt 171.4 lb

## 2021-12-28 DIAGNOSIS — C73 Malignant neoplasm of thyroid gland: Secondary | ICD-10-CM

## 2021-12-28 DIAGNOSIS — Z9889 Other specified postprocedural states: Secondary | ICD-10-CM

## 2021-12-28 LAB — T4, FREE: Free T4: 0.8 ng/dL (ref 0.60–1.60)

## 2021-12-28 LAB — T3, FREE: T3, Free: 4.1 pg/mL (ref 2.3–4.2)

## 2021-12-28 LAB — TSH: TSH: 1.95 u[IU]/mL (ref 0.35–5.50)

## 2021-12-28 NOTE — Progress Notes (Addendum)
Patient ID: Jenna [name], female   DOB: Jan 16, 1991, 31 y.o.   MRN: [id number]   This visit occurred during the SARS-CoV-2 public health emergency.  Safety protocols were in place, including screening questions prior to the visit, additional usage of staff PPE, and extensive cleaning of exam room while observing appropriate contact time as indicated for disinfecting solutions.   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  Last visit 1 year ago.  Interim history: She was seen in the emergency room on 09/05/2021 with chest pain.  This was believed to be pleuritic, in the setting of bronchitis.  TFTs were normal at that time. At this visit, she feels well, without complaints.  She mentions that the left side of her thyroid appears to be enlarged several weeks ago, but it decreased in size since then.  Reviewed history: Her nodule was found at the time of APE with her PCP.  At that time, PCP noted that she had a lump in her neck and sent her for a thyroid ultrasound.  The ultrasound showed a large right thyroid nodule and a complex right smaller nodule (see below).  Thyroid U/S (10/29/2018): Right inferior 2.1 x 1 x 1.3 cm solid, hypoechoic, thyroid nodule       12/03/2018: FNA:  Adequacy Reason Satisfactory For Evaluation. 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   Afirma molecular marker: Benign  12/01/2019: Thyroid U/S: CLINICAL DATA:  Prior ultrasound follow-up. History of right-sided thyroid nodule fine-needle aspiration performed 12/03/2018.  Parenchymal Echotexture: Mildly heterogenous Isthmus: Normal in size measures 0.3 cm in diameter Right lobe: Borderline enlarged measuring 6.8 x 1.4 x  2.0 cm, unchanged, previously, 6.7 x 1.9 x 2.0 cm Left lobe: Normal in size measuring 5.6 x 1.4 x 1.7 cm, unchanged previously, 5.0 x 1.4 x 1.7 cm  _________________________________________________________   The previously biopsied nodule within the inferior pole of the right lobe of the thyroid (labeled 1) is unchanged to slightly increased in size compared to the 10/2018 examination, currently measuring 2.4 x 1.7 x 1.2 cm, previously, 2.1 x 1.3 x 1.0 cm. Correlation with previous biopsy results is advised.  _________________________________________________________   Nodule # 2: Prior biopsy: No Location: Right; Inferior Maximum size: 1.0 cm; Other 2 dimensions: 0.9 x 0.7 cm, previously, 0.9 x 0.8 x 0.7 cm Composition: solid/almost completely solid (2) Echogenicity: hypoechoic (2) Echogenic foci: punctate echogenic foci (3) ACR TI-RADS total points: 7. Change in features: Yes nodule now appears to contain punctate echogenic foci Change in ACR TI-RADS risk category: Yes previously characterized as a TR4 nodule, currently a TR5 nodule ACR TI-RADS recommendations:   **Given size (>/= 1.0 cm) and appearance, fine needle aspiration of this highly suspicious nodule should be considered based on TI-RADS criteria.   _________________________________________________________   There is an approximately 0.6 x 0.5 x 0.5 cm well-defined hypoechoic nodule within the mid aspect the right lobe of the thyroid which is unchanged in hind site compared to the 11/2018 examination and again does not meet imaging criteria to recommend percutaneous sampling or continued dedicated follow-up.   There is a punctate (approximately 0.2 cm) hypoechoic nodule within the left lobe of the thyroid which is unchanged in hind site compared to the 11/2018 examination and again does not meet imaging  criteria to recommend percutaneous sampling or continued dedicated follow-up.   IMPRESSION: 1. Findings  suggestive of multinodular goiter. No definitive new worrisome thyroid nodules. 2. Nodule #2 now appears to contain punctate echogenic foci and as such has been up graded from a TR4 nodule to a TR5 nodule and now meets imaging criteria to recommend percutaneous sampling as clinically indicated. 3. Previously biopsied nodule within the inferior pole of the right lobe of the thyroid is unchanged to slightly increased in size compared to the 10/2018 examination, currently measuring 2.4 cm, previously, 2.1 cm. Correlation with previous biopsy results is advised. Regardless of slight increase in size, assuming a benign pathologic diagnosis, repeat sampling and/or continued dedicated follow-up is not recommended.  12/02/2019: Thyroid Uptake and scan: Hot nodule at inferior pole of RIGHT thyroid lobe with mild suppression of uptake throughout remaining thyroid tissue. No additional areas of increased or decreased tracer localization.   4 hour I-123 uptake = 14.5% (normal 5-20%)   24 hour I-123 uptake = 22.6% (normal 10-30%)   IMPRESSION: Normal 4 hour and 24 hour radio iodine uptakes.   Hot nodule at inferior pole RIGHT thyroid lobe with mild suppression of uptake of tracer within remaining thyroid tissue consistent with hyperfunctional RIGHT inferior thyroid adenoma.   Electronically Signed   By: Lavonia Dana M.D.   On: 12/02/2019 12:04  01/27/2020: FNA of the 1 x 0.9 x 0.7 cm right inferior nodule: FLUS (Bethesda category 3)  Afirma: Suspicious  05/27/2020: Right thyroid lobectomy with isthmusectomy by Dr. Harlow Asa: FINAL MICROSCOPIC DIAGNOSIS:   A. 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  - No extrathyroidal extension identified.  - Margins of resection are not involved.  - See oncology table.   Procedure: Right hemithyroidectomy  Tumor Focality: Multifocal  Tumor Site: Right lobe  Tumor  Size: Greatest dimension: 0.6 cm  Histologic Type: Papillary carcinoma  Margins: Uninvolved by tumor  Angioinvasion: Not identified  Lymphatic Invasion: Not identified  Extrathyroidal Extension: Not identified  Regional Lymph Nodes: No lymph nodes submitted or found  Pathologic Stage Classification (pTNM, AJCC 8th Edition): mpT1a, pNX  Representative Tumor Block: A6  Comment: Dr. Tresa Moore reviewed the case.   Pt denies: - feeling nodules in neck - hoarseness - dysphagia - choking - SOB with lying down  She is not on levothyroxine.  Reviewed her TFTs: Lab Results  Component Value Date   TSH 1.094 09/05/2021   TSH 1.46 07/06/2021   TSH 2.21 12/23/2020   TSH 3.78 06/17/2020   TSH 0.39 11/11/2019   TSH 0.43 10/11/2018   TSH 0.49 07/27/2017   TSH 0.50 12/05/2016   TSH 0.96 11/29/2015   TSH 1.29 07/08/2015   FREET4 0.68 07/06/2021   FREET4 0.70 12/23/2020   FREET4 0.70 06/17/2020   FREET4 0.83 11/11/2019    + FH of thyroid ds in mother: multiple thyroid nodules - benign, MGGM - also thyroid nodule. No FH of thyroid cancer. No h/o radiation tx to head or neck.  No herbal supplements. No Biotin use. No recent steroids use.  Pt also has a history of SVT (with chronic palpitations)- on beta blocker, heart murmur, HL. She is on Protonix for gastroduodenitis.  ROS: + see HPI  I reviewed pt's medications, allergies, PMH, social hx, family hx, and changes were documented in the history of present illness. Otherwise, unchanged from my initial visit note.  Past Medical History:  Diagnosis Date   Anxiety  Chronic diarrhea    Cyst of ovary 12/2017   Depression    Dysrhythmia    PVCs   GERD (gastroesophageal reflux disease)    Headache    stress related   Heart murmur    since childhood   Papillary thyroid carcinoma (HCC)    nodules on left lobe still- right side removed   Rapid palpitations 07/08/2015   SVT (supraventricular tachycardia) ([place])    last episode- 2 years  ago   Vasovagal syncope 2011   no since then   Past Surgical History:  Procedure Laterality Date   THYROID LOBECTOMY Right 05/27/2020   Procedure: RIGHT THYROID LOBECTOMY;  Surgeon: Armandina Gemma, MD;  Location: [facility];  Service: General;  Laterality: Right;   WISDOM TOOTH EXTRACTION     Social History   Socioeconomic History   Marital status: Single    Spouse name: Not on file   Number of children: 0   Years of education: Not on file   Highest education level: Not on file  Occupational History    Physiological scientist  Social Needs   Financial resource strain: Not on file   Food insecurity:    Worry: Not on file    Inability: Not on file   Transportation needs:    Medical: Not on file    Non-medical: Not on file  Tobacco Use   Smoking status: Current Every Day Smoker    Packs/day: 1.00    Types: Cigarettes   Smokeless tobacco: Never Used   Tobacco comment: 7 cigarettes daily  Substance and Sexual Activity   Alcohol use: Yes    Alcohol/week:     Types:     Comment: Rarely, beer, 3 drinks   Drug use: No  Lifestyle   Physical activity:    Days per week: Not on file    Minutes per session: Not on file   Stress: Not on file  Social History Narrative   Lives in [place] with her mother, Karlee Staff, and her brother, Lamount Cranker.  Identifies as [place]. Endorses tobacco use as well as rare THC use. Denies ETOH use. + sexually active. Has implanon.     GED   Installs pools   Current Outpatient Medications on File Prior to Visit  Medication Sig Dispense Refill   benzonatate (TESSALON) 100 MG capsule Take 1 capsule (100 mg total) by mouth 3 (three) times daily as needed for cough. 30 capsule 0   fluticasone (FLONASE) 50 MCG/ACT nasal spray Place 2 sprays into both nostrils daily. 16 g 1   norgestimate-ethinyl estradiol (SPRINTEC 28) 0.25-35 MG-MCG tablet Take 1 tablet by mouth daily. 84 tablet 3   pantoprazole (PROTONIX) 40 MG tablet Take 1 tablet (40 mg  total) by mouth 2 (two) times daily. 180 tablet 3   Psyllium (METAMUCIL) 28.3 % POWD Take 1 dose by mouth qd; WILL PURCHASE OTC (Patient not taking: Reported on 08/09/2021) 575 g 0   valACYclovir (VALTREX) 1000 MG tablet Take 2 tabs by mouth now and again in 12 hrs 4 tablet 3   No current facility-administered medications on file prior to visit.   Allergies  Allergen Reactions   Adhesive [Tape] Itching and Rash    Blistering of skin     Family History  Problem Relation Age of Onset   Arrhythmia Maternal Grandmother    Hypertension Maternal Grandfather    Diabetes Maternal Grandfather    Heart attack Maternal Grandfather    Heart disease Maternal Grandfather  Heart murmur Mother    Melanoma Mother        stage 3 malignant melanoma   Healthy Father    Healthy Brother    Scoliosis Brother    Other Maternal Aunt        TACHYCARDIA   Diabetes Paternal Grandfather    Arrhythmia Other        Aunt w/ tachyarrythmia at age 36   Cancer Other    Stomach cancer Other    Colon cancer Neg Hx    Esophageal cancer Neg Hx    Rectal cancer Neg Hx    PE: BP 110/68 (BP Location: Right Arm, Patient Position: Sitting, Cuff Size: Normal)    Pulse 89    Ht 5\' 4"  (1.626 m)    Wt 171 lb 6.4 oz (77.7 kg)    SpO2 98%    BMI 29.42 kg/m  Wt Readings from Last 3 Encounters:  12/28/21 171 lb 6.4 oz (77.7 kg)  09/05/21 170 lb (77.1 kg)  07/11/21 172 lb (78 kg)   Constitutional: Slightly overweight, in NAD Eyes: PERRLA, EOMI, no exophthalmos ENT: moist mucous membranes, no neck masses palpated, thyroidectomy scar without keloid, no cervical lymphadenopathy Cardiovascular: tachycardia, RR, No MRG Respiratory: CTA B Musculoskeletal: no deformities, strength intact in all 4 Skin: moist, warm, no rashes Neurological: no tremor with outstretched hands, DTR normal in all 4  ASSESSMENT: 1.  Papillary thyroid cancer -Multifocal, subcentimeter  2. S/p right thyroid lobectomy  3. Subclinical  hyperthyroidism  PLAN: 1.  Papillary thyroid cancer -Reviewed the images of her previous thyroid ultrasound and also previous investigation and treatment.  She has a history of thyroid nodules, of which the dominant nodule was fairly large, hypoechoic, without other worrisome characteristics.  The nodule appeared to be hot on a thyroid uptake and scan.  However, on her ultrasound from 12/01/2019, she also had a small, right inferior thyroid nodule, measuring 1 x 0.9 x 0.7 cm, which was solid, hypoechoic, and with microcalcifications.  We biopsied this nodule with inconclusive results on 01/27/2020, however, Afirma molecular marker returns suspicious.  Therefore, I referred her to surgery for hemithyroidectomy.  Final pathology showed papillary thyroid cancer, multifocal, with the largest focus of 0.6 cm, but without lymphovascular extension or other worrisome characteristics.  Since she has stage I, low risk, thyroid cancer and the cancer was very small, I did not suggest completion thyroidectomy and RAI treatment.  We will continue to follow her with neck ultrasounds.  We will check this now. -Thyroglobulin measurements are not indicated anymore in follow-up for patients undergoing hemithyroidectomy, without RAI treatment -We will recheck her TFTs today -I will see her back in 1 year  2.  S/p right thyroid lobectomy -She did have keloid after the surgical scar healed, so I advised her to use Kelo-cote strips.  These worked very well for her so as of now, she has no keloid at the scar site. -Also, no pain or dysesthesia at the surgical scar -We will recheck her TFTs today to see if she needs levothyroxine supplementation -Approximately 25% of patients may require levothyroxine after thyroidectomy but the risk decreases significantly with time  3. Subclinical hyperthyroidism -This appears to have resolved after her thyroid surgery -We will recheck her TFTs now  Component     Latest Ref Rng & Units  12/28/2021  TSH     0.35 - 5.50 uIU/mL 1.95  T4,Free(Direct)     0.60 - 1.60 ng/dL 0.80  Triiodothyronine,Free,Serum  2.3 - 4.2 pg/mL 4.1  Thyroid tests are normal.  No need to start levothyroxine.  Neck U/S (01/10/2022): 1. No significant abnormality of the right thyroidectomy bed. 2. No suspicious left thyroid nodules.  Philemon Kingdom, MD PhD [facility]

## 2021-12-28 NOTE — Patient Instructions (Addendum)
Please stop at the lab.  We will check a new thyroid U/S.  Please come back for a follow-up appointment in 1 year.

## 2022-01-10 ENCOUNTER — Ambulatory Visit
Admission: RE | Admit: 2022-01-10 | Discharge: 2022-01-10 | Disposition: A | Discharge status: Home / Self Care | Payer: BC Managed Care – PPO | Source: Ambulatory Visit | Attending: Internal Medicine | Admitting: Internal Medicine

## 2022-01-10 DIAGNOSIS — E041 Nontoxic single thyroid nodule: Secondary | ICD-10-CM | POA: Diagnosis not present

## 2022-01-10 DIAGNOSIS — E890 Postprocedural hypothyroidism: Secondary | ICD-10-CM | POA: Diagnosis not present

## 2022-01-10 DIAGNOSIS — C73 Malignant neoplasm of thyroid gland: Secondary | ICD-10-CM

## 2022-01-23 IMAGING — CR DG CHEST 2V
2 series · 2 of 2 positions shown · non-contrast
Comparison: 8639

CLINICAL DATA: Chest pain

EXAM:
CHEST - 2 VIEW

[chest pa]
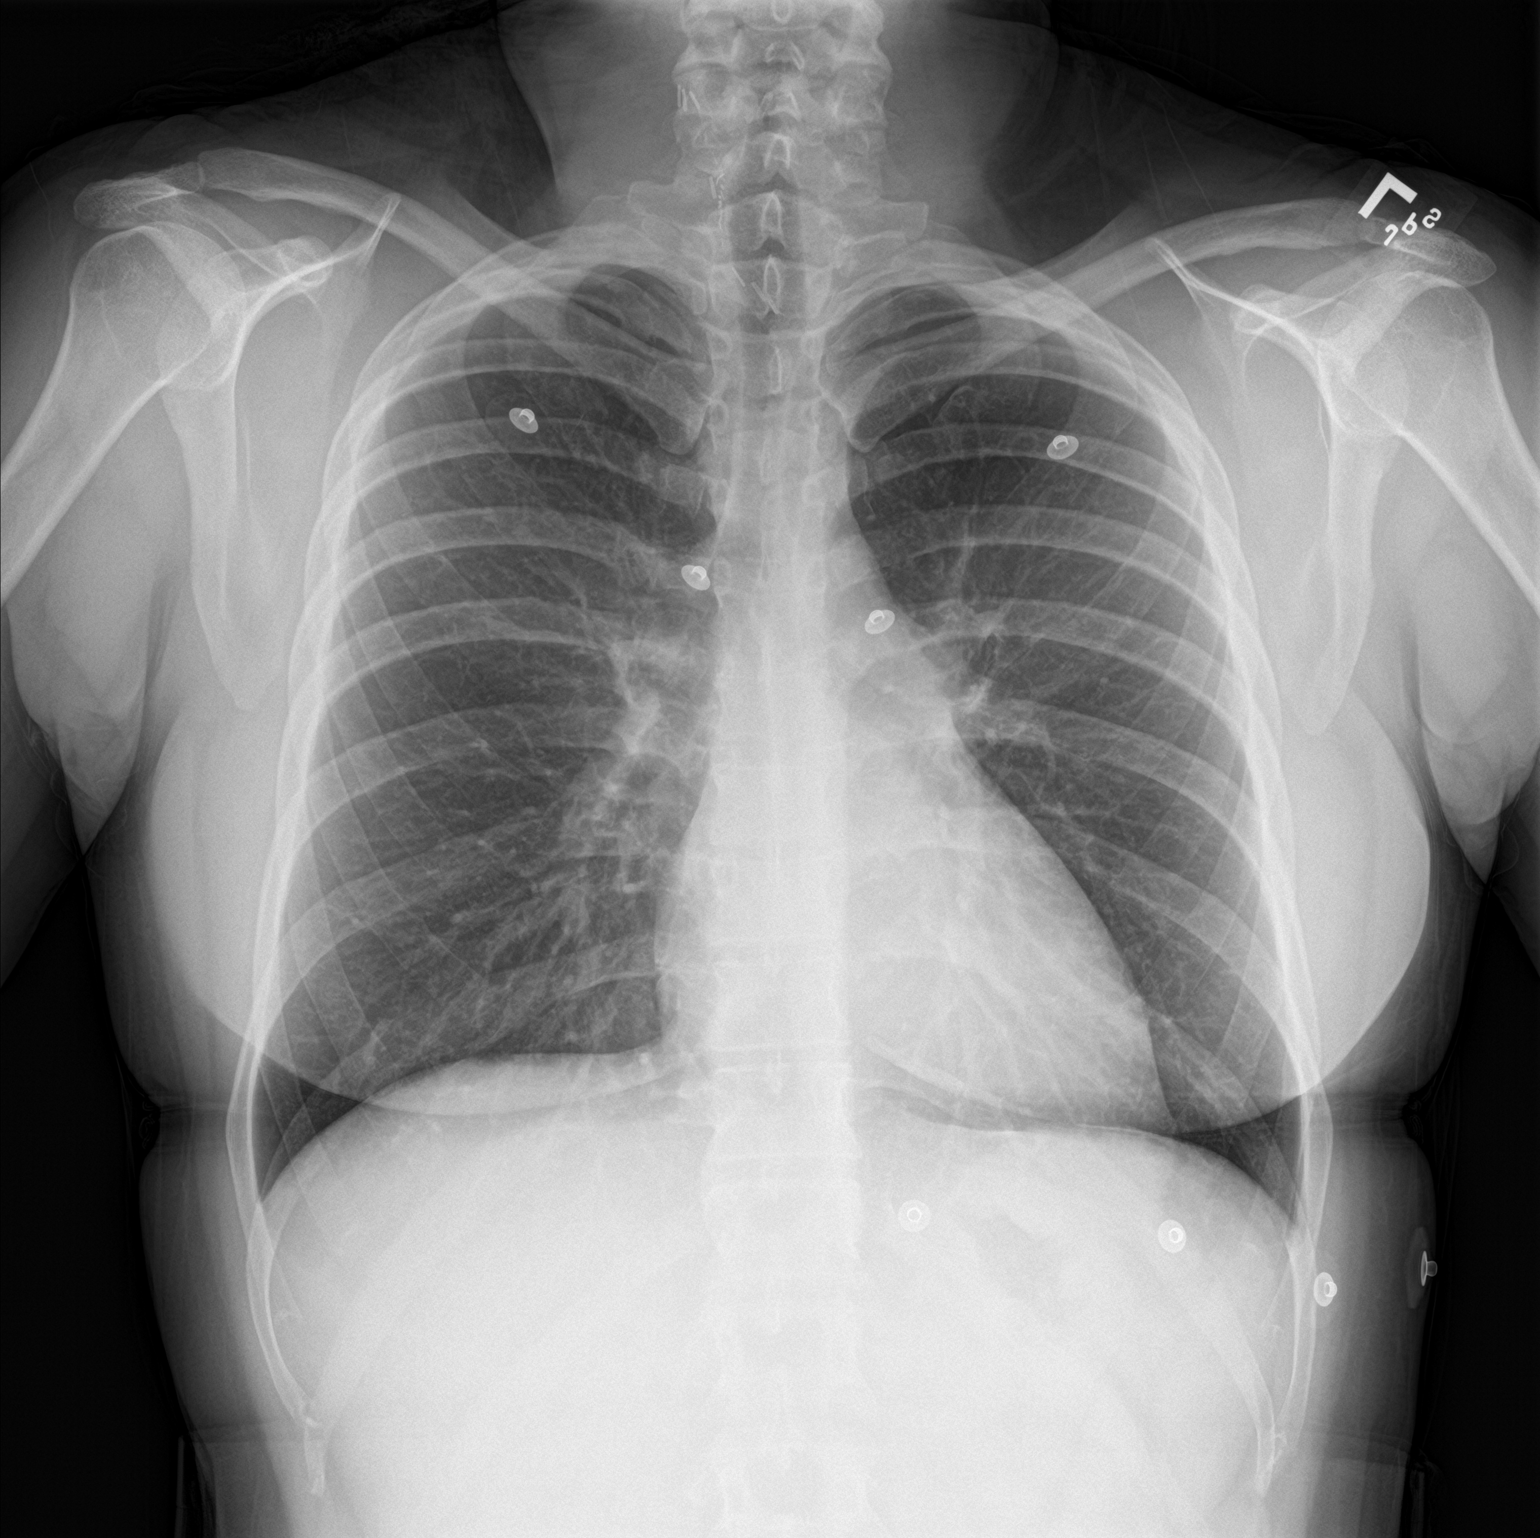

[chest lat]
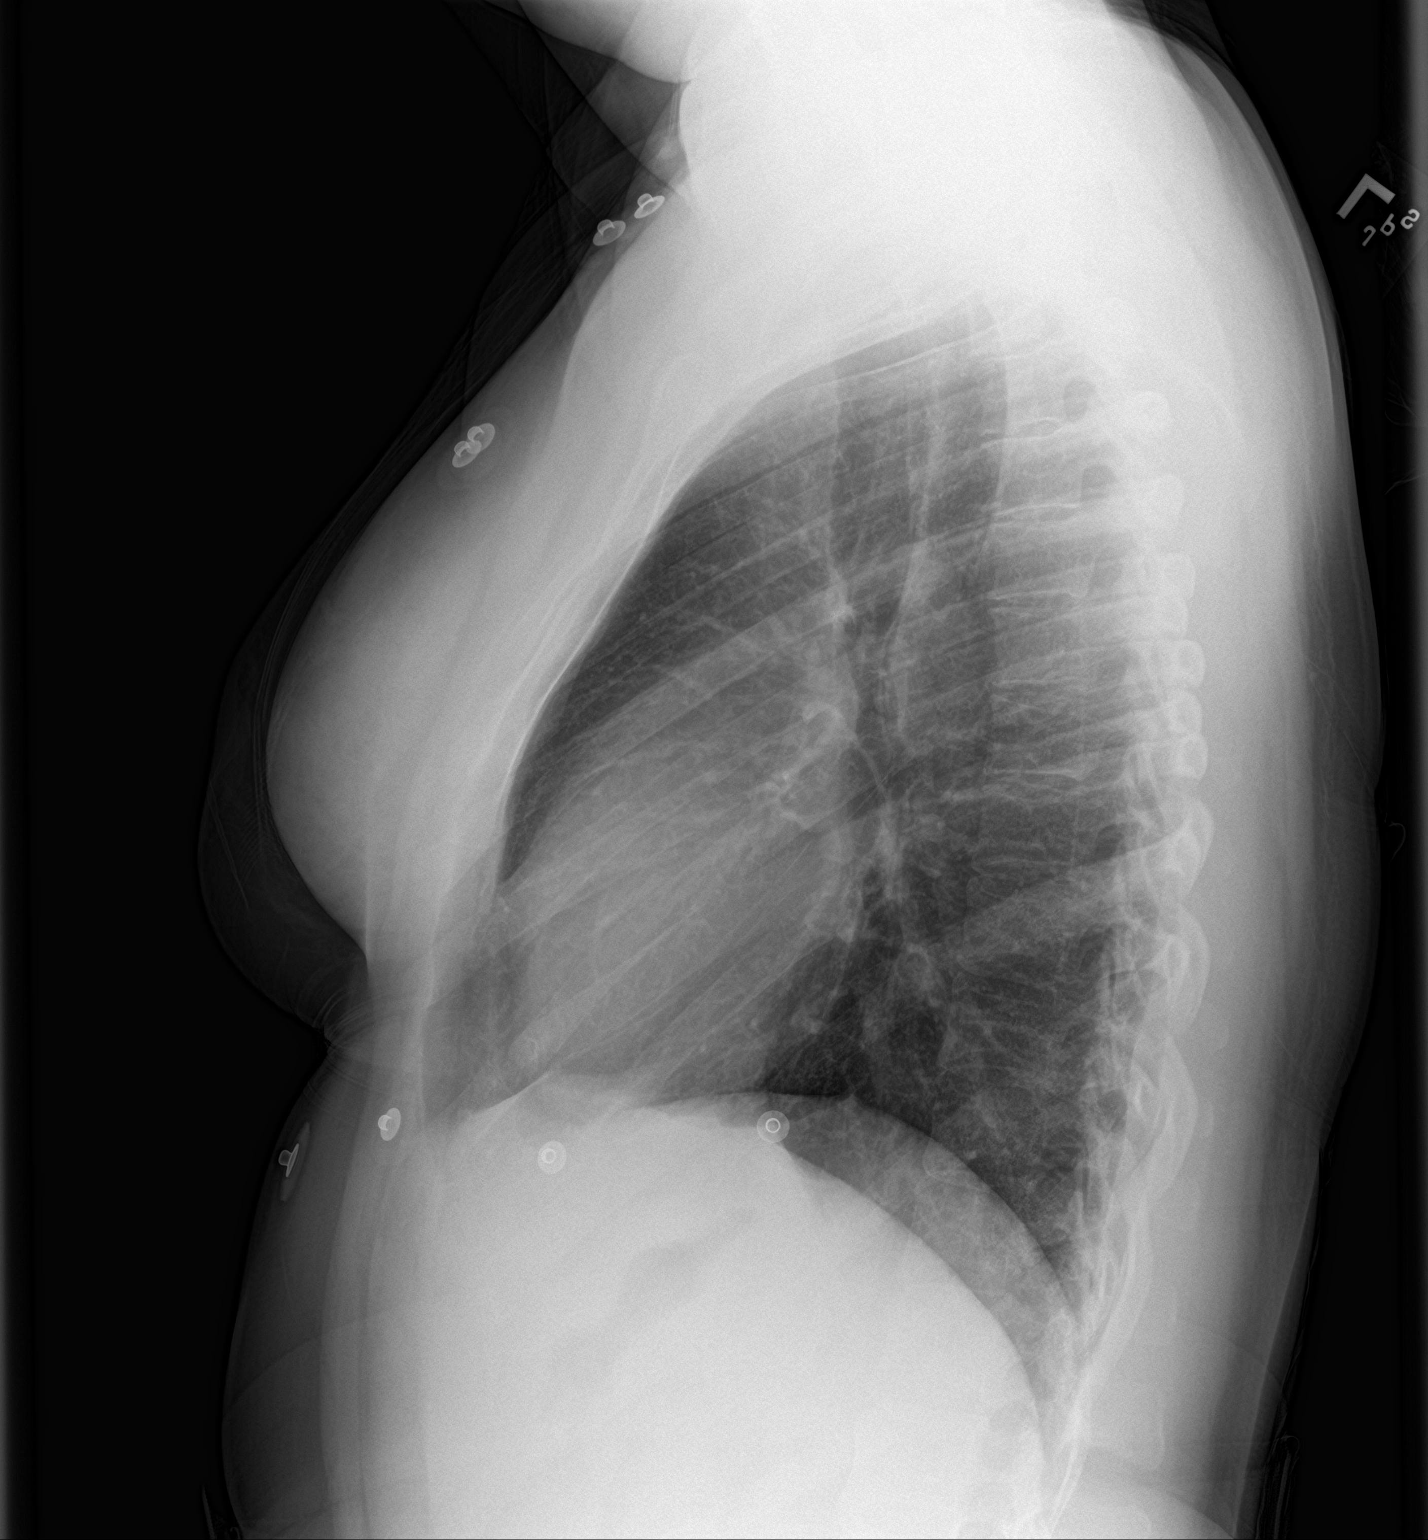

[2 of 2 positions shown; findings below may reference images not displayed]

FINDINGS: The heart size and mediastinal contours are within normal limits.
Both lungs are clear. No pleural effusion or pneumothorax. The
visualized skeletal structures are unremarkable.
IMPRESSION: No acute process in the chest.

## 2022-05-20 ENCOUNTER — Encounter: Payer: Self-pay | Admitting: Family

## 2022-05-21 ENCOUNTER — Other Ambulatory Visit: Payer: Self-pay | Admitting: Family

## 2022-05-21 MED ORDER — VALACYCLOVIR HCL 1 G PO TABS: ORAL_TABLET | ORAL | 3 refills | Status: AC

## 2022-05-30 IMAGING — US US THYROID
1 series · 14 of 25 positions shown · non-contrast
Comparison: 12/01/2019

01/27/2020

CLINICAL DATA: Papillary thyroid malignancy status post right
thyroidectomy

EXAM:
THYROID ULTRASOUND
TECHNIQUE: Ultrasound examination of the thyroid gland and adjacent soft
tissues was performed.

[Series 1: us thyroid · 0.06mm/px · 14 of 32 slices shown]
[im 1/32]
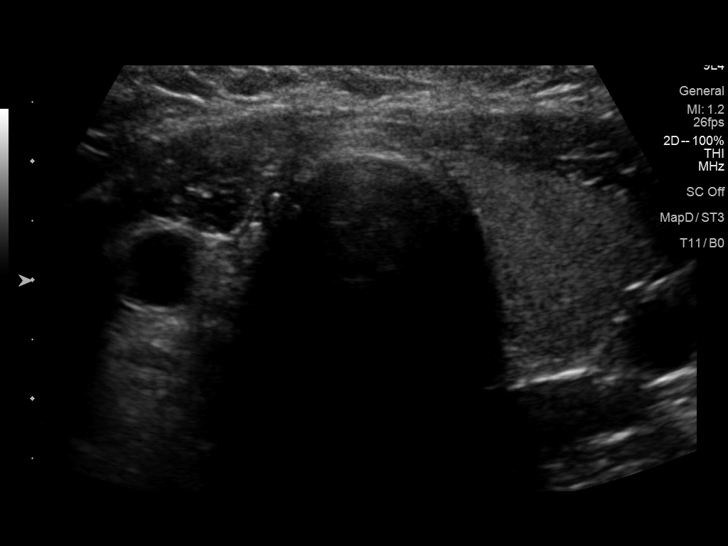
[im 3/32]
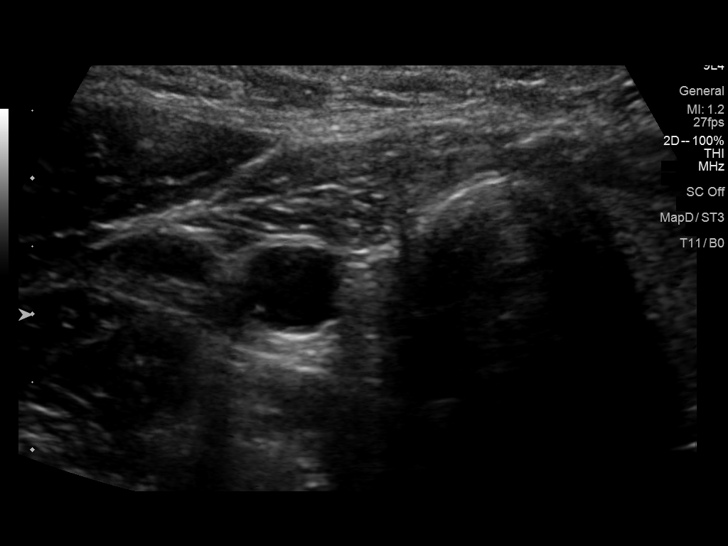
[im 6/32]
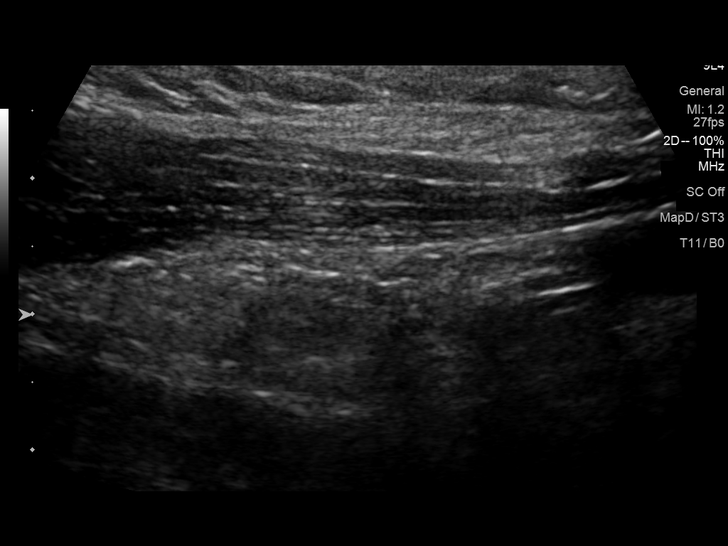
[im 8/32]
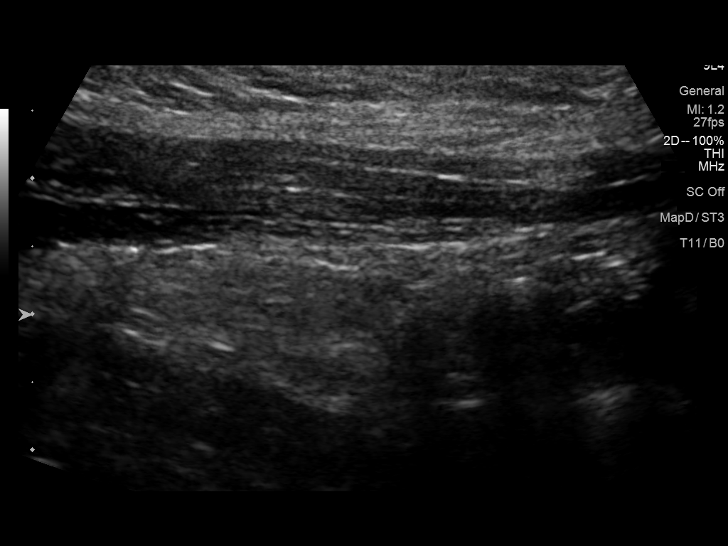
[im 11/32]
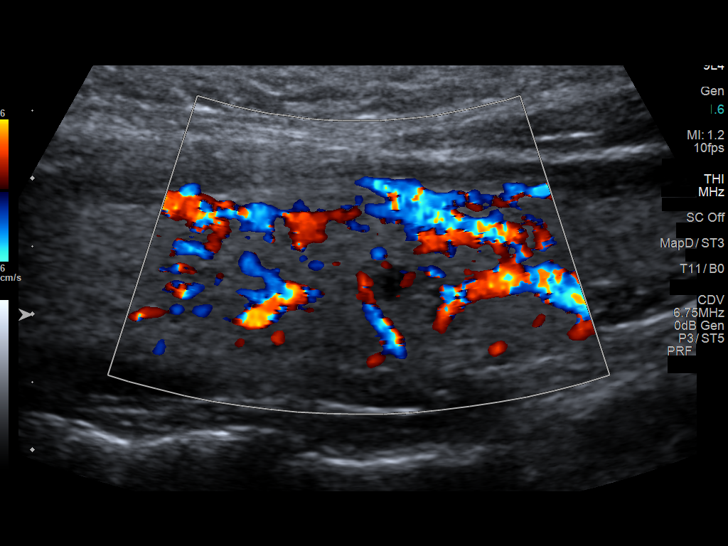
[im 12/32]
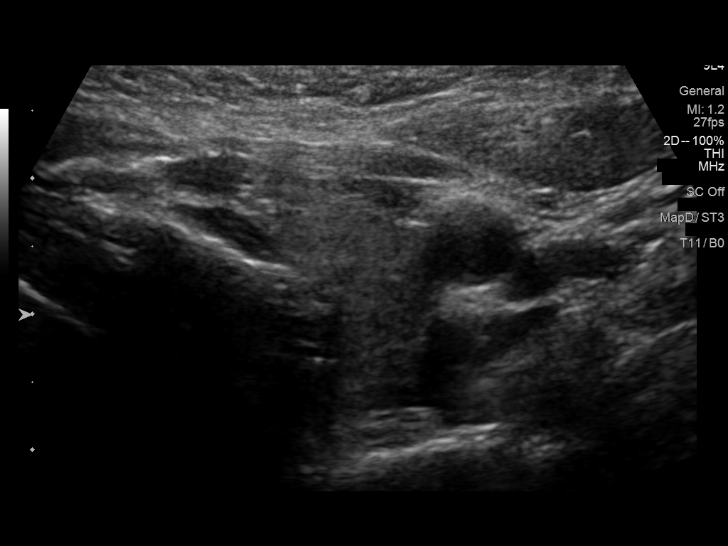
[im 15/32]
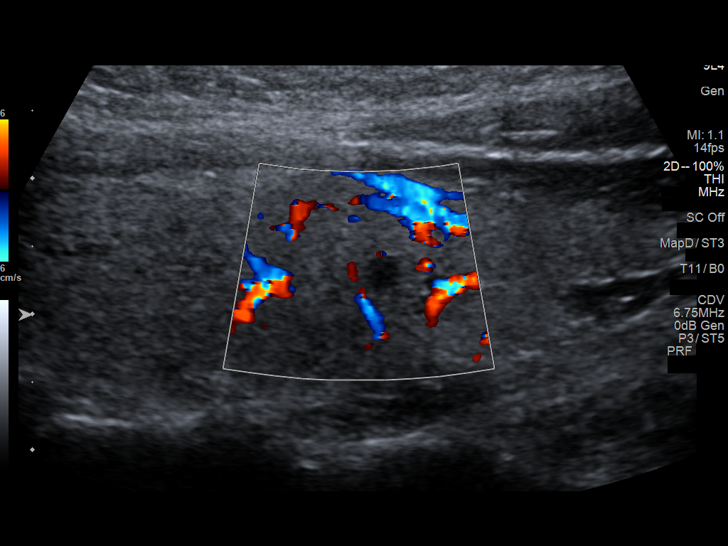
[im 17/32]
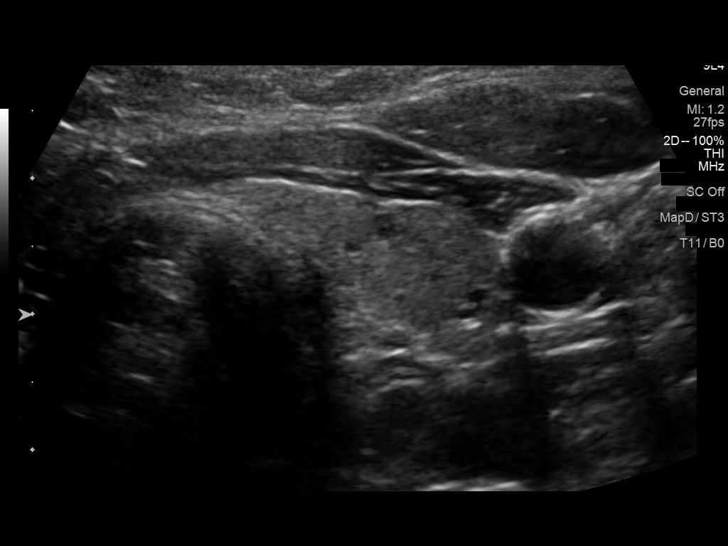
[im 20/32]
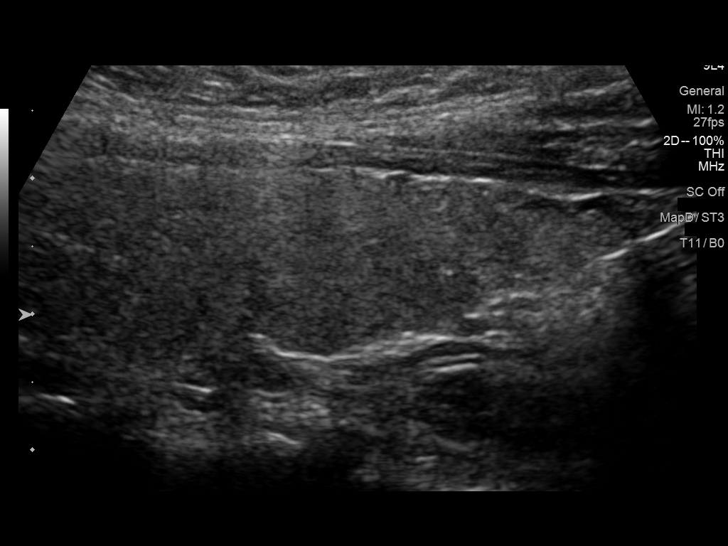
[im 21/32]
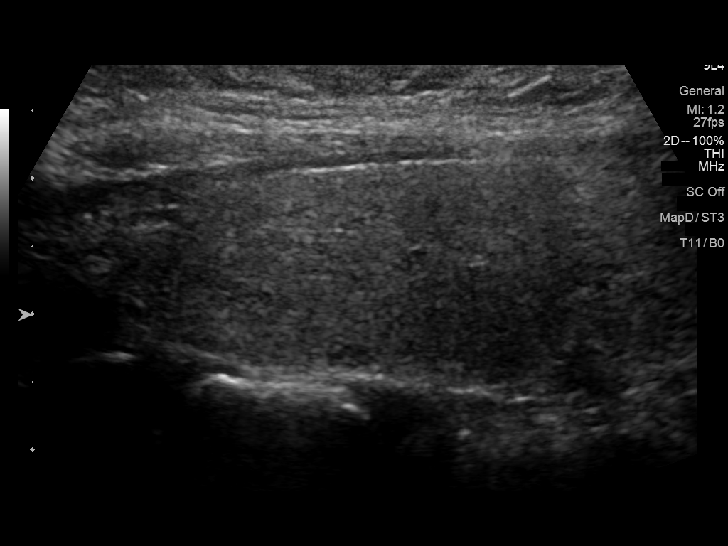
[im 24/32]
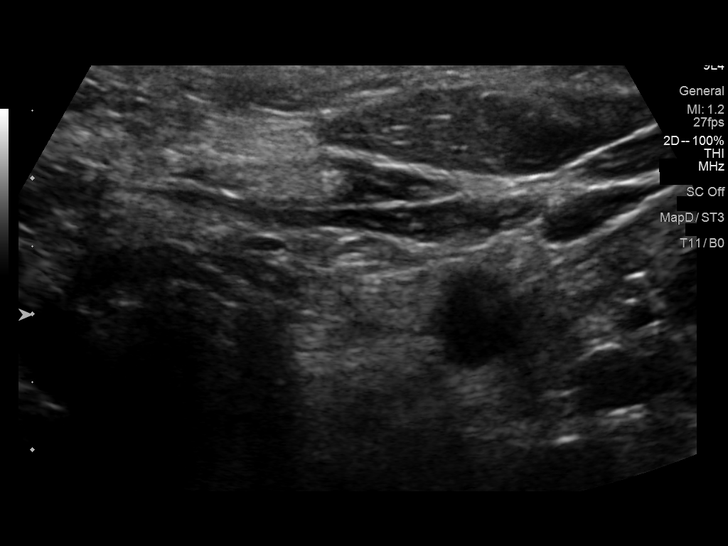
[im 26/32]
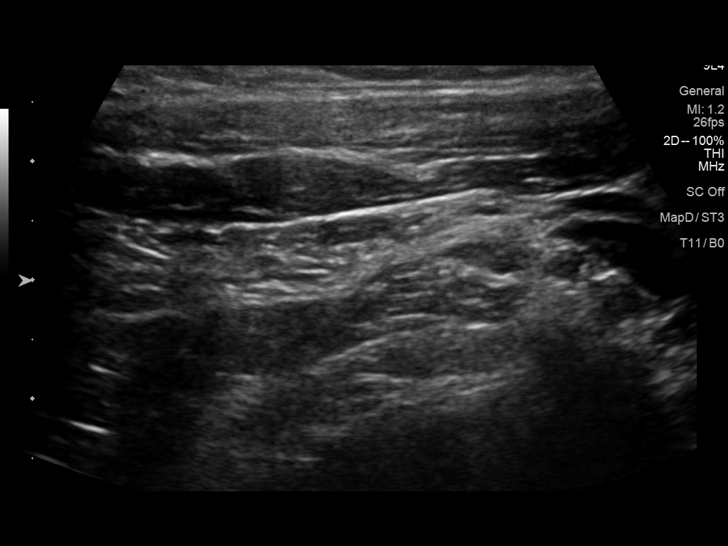
[im 29/32]
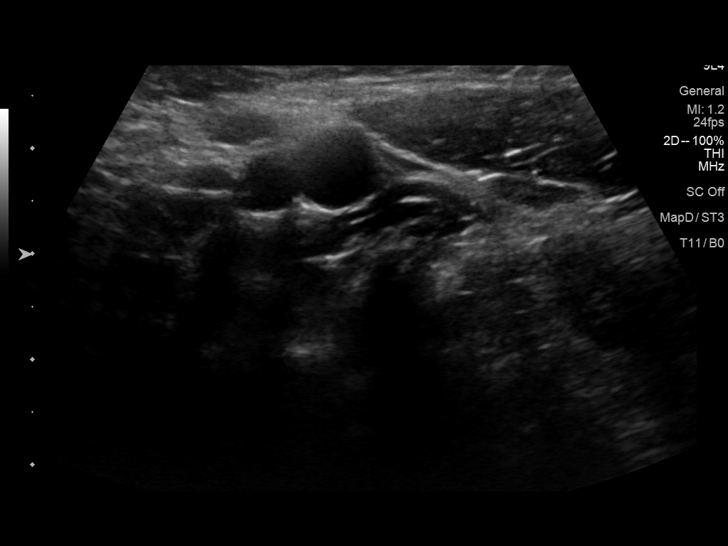
[im 32/32]
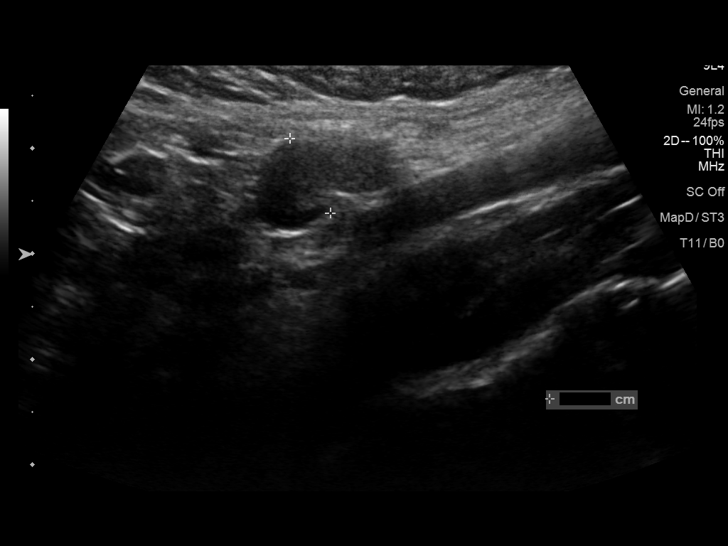

[14 of 25 positions shown; findings below may reference images not displayed]

FINDINGS: Parenchymal Echotexture: Mildly heterogeneous

Isthmus: Surgically absent

Right lobe: Surgically absent

Left lobe: 5.8 x 1.7 x 1.8 cm

_________________________________________________________

Estimated total number of nodules >/= 1 cm: 0

Number of spongiform nodules >/=  2 cm not described below (TR1): 0

Number of mixed cystic and solid nodules >/= 1.5 cm not described
below (TR2): 0

_________________________________________________________

No significant abnormality of the thyroid resection bed. No
abnormally enlarged neck lymph nodes.

Solitary predominantly cystic subcentimeter left thyroid nodule does
not meet criteria for FNA or imaging follow-up.
IMPRESSION: 1. No significant abnormality of the right thyroidectomy bed.
2. No suspicious left thyroid nodules.

The above is in keeping with the ACR TI-RADS recommendations - [HOSPITAL] 0952;[DATE].

## 2022-07-13 ENCOUNTER — Telehealth: Payer: Self-pay | Admitting: Family

## 2022-07-13 DIAGNOSIS — Z111 Encounter for screening for respiratory tuberculosis: Secondary | ICD-10-CM

## 2022-07-13 NOTE — Telephone Encounter (Signed)
Okay to order TB gold and schedule lab visit

## 2022-07-13 NOTE — Telephone Encounter (Signed)
Patient states she needs a TB test done for school, she stated the lab test would be ok. Please advise.

## 2022-07-14 NOTE — Telephone Encounter (Signed)
Lvm for patient to call back and scheduled a lab appointment.

## 2022-07-14 NOTE — Telephone Encounter (Signed)
Appointment made for 07/17/22

## 2022-07-14 NOTE — Telephone Encounter (Signed)
Order placed. Please schedule lab appointment.

## 2022-07-17 ENCOUNTER — Other Ambulatory Visit (INDEPENDENT_AMBULATORY_CARE_PROVIDER_SITE_OTHER): Payer: BC Managed Care – PPO

## 2022-07-17 DIAGNOSIS — Z111 Encounter for screening for respiratory tuberculosis: Secondary | ICD-10-CM

## 2022-07-18 ENCOUNTER — Ambulatory Visit: Payer: BC Managed Care – PPO | Attending: Internal Medicine

## 2022-07-18 ENCOUNTER — Other Ambulatory Visit (HOSPITAL_BASED_OUTPATIENT_CLINIC_OR_DEPARTMENT_OTHER): Payer: Self-pay

## 2022-07-18 DIAGNOSIS — Z23 Encounter for immunization: Secondary | ICD-10-CM

## 2022-07-18 MED ORDER — PFIZER COVID-19 VAC BIVALENT 30 MCG/0.3ML IM SUSP
INTRAMUSCULAR | 0 refills | Status: DC
  Filled 2022-07-18: qty 0.3, 1d supply, fill #0

## 2022-07-18 NOTE — Progress Notes (Signed)
   Covid-19 Vaccination Clinic  Name:  Jenna [name]    MRN: [id number] DOB: 07/26/1991  07/18/2022  [name] was observed post Covid-19 immunization for 15 minutes without incident. She was provided with Vaccine Information Sheet and instruction to access the V-Safe system.   [name] was instructed to call 911 with any severe reactions post vaccine: Difficulty breathing  Swelling of face and throat  A fast heartbeat  A bad rash all over body  Dizziness and weakness   Immunizations Administered     Name Date Dose VIS Date Route   Pfizer Covid-19 Vaccine Bivalent Booster 07/18/2022  9:40 AM 0.3 mL 08/17/2021 Intramuscular   Manufacturer: [place]   Lot: [id number]   [place]: [phone]

## 2022-07-19 LAB — QUANTIFERON-TB GOLD PLUS
Mitogen-NIL: 8.89 IU/mL
NIL: 0.01 IU/mL
QuantiFERON-TB Gold Plus: NEGATIVE
TB1-NIL: 0 IU/mL
TB2-NIL: 0 IU/mL

## 2022-08-01 ENCOUNTER — Other Ambulatory Visit (HOSPITAL_BASED_OUTPATIENT_CLINIC_OR_DEPARTMENT_OTHER): Payer: Self-pay

## 2022-08-03 ENCOUNTER — Other Ambulatory Visit (HOSPITAL_BASED_OUTPATIENT_CLINIC_OR_DEPARTMENT_OTHER): Payer: Self-pay

## 2022-08-08 ENCOUNTER — Other Ambulatory Visit (HOSPITAL_BASED_OUTPATIENT_CLINIC_OR_DEPARTMENT_OTHER): Payer: Self-pay

## 2022-08-10 ENCOUNTER — Other Ambulatory Visit: Payer: Self-pay | Admitting: Advanced Practice Midwife

## 2022-08-16 ENCOUNTER — Other Ambulatory Visit: Payer: Self-pay

## 2022-08-16 MED ORDER — NORGESTIMATE-ETH ESTRADIOL 0.25-35 MG-MCG PO TABS: 1.0000 | ORAL_TABLET | Freq: Every day | ORAL | 3 refills | Status: DC

## 2022-08-24 ENCOUNTER — Other Ambulatory Visit (HOSPITAL_BASED_OUTPATIENT_CLINIC_OR_DEPARTMENT_OTHER): Payer: Self-pay

## 2022-08-25 ENCOUNTER — Other Ambulatory Visit (HOSPITAL_BASED_OUTPATIENT_CLINIC_OR_DEPARTMENT_OTHER): Payer: Self-pay

## 2022-09-01 ENCOUNTER — Other Ambulatory Visit (HOSPITAL_BASED_OUTPATIENT_CLINIC_OR_DEPARTMENT_OTHER): Payer: Self-pay

## 2022-09-07 ENCOUNTER — Other Ambulatory Visit (HOSPITAL_BASED_OUTPATIENT_CLINIC_OR_DEPARTMENT_OTHER): Payer: Self-pay

## 2022-09-08 ENCOUNTER — Ambulatory Visit (INDEPENDENT_AMBULATORY_CARE_PROVIDER_SITE_OTHER): Payer: Self-pay | Admitting: Family

## 2022-09-08 VITALS — BP 113/61 | HR 71 | Temp 98.4°F | Resp 16 | Wt 174.0 lb

## 2022-09-08 DIAGNOSIS — J029 Acute pharyngitis, unspecified: Secondary | ICD-10-CM

## 2022-09-08 LAB — POCT RAPID STREP A (OFFICE): Rapid Strep A Screen: NEGATIVE

## 2022-09-08 NOTE — Assessment & Plan Note (Signed)
Rapid strep is negative. Suspect viral etiology. Pt is advised to continue supportive measures and to call if symptoms worsen or if not improved in 1 week.

## 2022-09-08 NOTE — Progress Notes (Signed)
Subjective:   By signing my name below, I, Carylon Perches, attest that this documentation has been prepared under the direction and in the presence of Jeri Lager' Su[place]ivan, NP 09/08/2022   Patient ID: Jenna [name], female    DOB: Oct 07, 1991, 31 y.o.   MRN: [id number]  Chief Complaint  Patient presents with   Sore Throat    Complains of sore throat    HPI Patient is in today for an office visit   Sore Throat: She complains of a sore throat that is prominent on one side of her throat. She describes her symptom as a burning sensation. She also describes occasional feelings to the area as though there is glass scrapping the back of her throat or her throat is being "torn apart." Symptoms appeared on 08/31/2022. She took a Covid-19 test on 09/07/2022 and reports that results were negative. She states that she is eating and drinking fine. She has been taking Ibuprofen and Tylenol but symptoms are only mildly alleviated. She denies of any fever.   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 side removed   Rapid palpitations 07/08/2015   SVT (supraventricular tachycardia) ([place])    last episode- 2 years ago   Vasovagal syncope 2011   no since then    Past Surgical History:  Procedure Laterality Date   THYROID LOBECTOMY Right 05/27/2020   Procedure: RIGHT THYROID LOBECTOMY;  Surgeon: Armandina Gemma, MD;  Location: [facility];  Service: General;  Laterality: Right;   WISDOM TOOTH EXTRACTION      Family History  Problem Relation Age of Onset    Arrhythmia Maternal Grandmother    Hypertension Maternal Grandfather    Diabetes Maternal Grandfather    Heart attack Maternal Grandfather    Heart disease Maternal Grandfather    Heart murmur Mother    Melanoma Mother        stage 3 malignant melanoma   Healthy Father    Healthy Brother    Scoliosis Brother    Other Maternal Aunt        TACHYCARDIA   Diabetes Paternal Grandfather    Arrhythmia Other        Aunt w/ tachyarrythmia at age 76   Cancer Other    Stomach cancer Other    Colon cancer Neg Hx    Esophageal cancer Neg Hx    Rectal cancer Neg Hx     Social History   Socioeconomic History   Marital status: Single    Spouse name: Not on file   Number of children: Not on file   Years of education: Not on file   Highest education level: Some college, no degree  Occupational History   Not on file  Tobacco Use   Smoking status: Every Day    Packs/day: 0.50    Years: 10.00    Total pack years: 5.00    Types: Cigarettes   Smokeless tobacco:  Never   Tobacco comments:    7 cigarettes daily  Vaping Use   Vaping Use: Former   Start date: 05/20/2015   Quit date: 05/19/2017   Substances: Nicotine, Flavoring  Substance and Sexual Activity   Alcohol use: Yes    Alcohol/week: 1.0 standard drink of alcohol    Types: 1 Standard drinks or equivalent per week    Comment: 2 beers/week   Drug use: No   Sexual activity: Yes    Partners: Male    Birth control/protection: Pill    Comment: Patient declined to answer sexual history.  Other Topics Concern   Not on file  Social History Narrative   Lives in [place] with her mother, Bekki Tavenner, and her brother, Lamount Cranker.  Identifies as [place]. Endorses tobacco use as well as rare THC use. Denies ETOH use. + sexually active. Has implanon.     GED   Installs pools   Social Determinants of Health   Financial Resource Strain: Not on file  Food Insecurity: Not on file  Transportation Needs: Not on file  Physical  Activity: Not on file  Stress: Not on file  Social Connections: Not on file  Intimate Partner Violence: Not on file    Outpatient Medications Prior to Visit  Medication Sig Dispense Refill   COVID-19 mRNA bivalent vaccine, Pfizer, (PFIZER COVID-19 VAC BIVALENT) injection Inject into the muscle. 0.3 mL 0   norgestimate-ethinyl estradiol (SPRINTEC 28) 0.25-35 MG-MCG tablet Take 1 tablet by mouth daily. 84 tablet 3   pantoprazole (PROTONIX) 40 MG tablet Take 1 tablet (40 mg total) by mouth 2 (two) times daily. 180 tablet 3   Psyllium (METAMUCIL) 28.3 % POWD Take 1 dose by mouth qd; WILL PURCHASE OTC 575 g 0   valACYclovir (VALTREX) 1000 MG tablet Take 2 tabs by mouth now and again in 12 hrs 4 tablet 3   No facility-administered medications prior to visit.    Allergies  Allergen Reactions   Adhesive [Tape] Itching and Rash    Blistering of skin      Review of Systems  Constitutional:  Negative for fever.  HENT:  Positive for sore throat.        Objective:    Physical Exam Constitutional:      General: She is not in acute distress.    Appearance: Normal appearance. She is not ill-appearing.  HENT:     Head: Normocephalic and atraumatic.     Right Ear: Tympanic membrane, ear canal and external ear normal.     Left Ear: Tympanic membrane, ear canal and external ear normal.     Mouth/Throat:     Pharynx: Posterior oropharyngeal erythema (Mild) present.  Eyes:     Extraocular Movements: Extraocular movements intact.     Pupils: Pupils are equal, round, and reactive to light.  Cardiovascular:     Rate and Rhythm: Normal rate and regular rhythm.     Heart sounds: Normal heart sounds. No murmur heard.    No gallop.  Pulmonary:     Effort: Pulmonary effort is normal. No respiratory distress.     Breath sounds: Normal breath sounds. No wheezing or rales.  Lymphadenopathy:     Cervical: No cervical adenopathy.  Skin:    General: Skin is warm and dry.  Neurological:      Mental Status: She is alert and oriented to person, place, and time.  Psychiatric:        Mood and Affect: Mood normal.  Behavior: Behavior normal.        Judgment: Judgment normal.     BP 113/61 (BP Location: Left Arm, Patient Position: Sitting, Cuff Size: Small)   Pulse 71   Temp 98.4 F (36.9 C) (Oral)   Resp 16   Wt 174 lb (78.9 kg)   SpO2 100%   BMI 29.87 kg/m  Wt Readings from Last 3 Encounters:  09/08/22 174 lb (78.9 kg)  12/28/21 171 lb 6.4 oz (77.7 kg)  09/05/21 170 lb (77.1 kg)       Assessment & Plan:   Problem List Items Addressed This Visit       Unprioritized   Sore throat - Primary    Rapid strep is negative. Suspect viral etiology. Pt is advised to continue supportive measures and to call if symptoms worsen or if not improved in 1 week.       Relevant Orders   POCT rapid strep A (Completed)   No orders of the defined types were placed in this encounter.   I, Nance Pear, NP, personally preformed the services described in this documentation.  All medical record entries made by the scribe were at my direction and in my presence.  I have reviewed the chart and discharge instructions (if applicable) and agree that the record reflects my personal performance and is accurate and complete. 09/08/2022   I,Amber Collins,acting as a scribe for Nance Pear, NP.,have documented all relevant documentation on the behalf of Nance Pear, NP,as directed by  Nance Pear, NP while in the presence of Nance Pear, NP.    Nance Pear, NP

## 2022-09-12 ENCOUNTER — Other Ambulatory Visit (HOSPITAL_BASED_OUTPATIENT_CLINIC_OR_DEPARTMENT_OTHER): Payer: Self-pay

## 2022-09-13 ENCOUNTER — Other Ambulatory Visit (HOSPITAL_BASED_OUTPATIENT_CLINIC_OR_DEPARTMENT_OTHER): Payer: Self-pay

## 2022-09-19 ENCOUNTER — Other Ambulatory Visit (HOSPITAL_BASED_OUTPATIENT_CLINIC_OR_DEPARTMENT_OTHER): Payer: Self-pay

## 2022-09-25 ENCOUNTER — Other Ambulatory Visit (HOSPITAL_BASED_OUTPATIENT_CLINIC_OR_DEPARTMENT_OTHER): Payer: Self-pay

## 2022-09-26 ENCOUNTER — Encounter: Payer: Self-pay | Admitting: Advanced Practice Midwife

## 2022-09-26 ENCOUNTER — Other Ambulatory Visit (HOSPITAL_BASED_OUTPATIENT_CLINIC_OR_DEPARTMENT_OTHER): Payer: Self-pay

## 2022-09-26 ENCOUNTER — Ambulatory Visit (INDEPENDENT_AMBULATORY_CARE_PROVIDER_SITE_OTHER): Payer: Self-pay | Admitting: Advanced Practice Midwife

## 2022-09-26 VITALS — BP 109/60 | HR 62 | Ht 64.0 in | Wt 175.0 lb

## 2022-09-26 DIAGNOSIS — C73 Malignant neoplasm of thyroid gland: Secondary | ICD-10-CM

## 2022-09-26 DIAGNOSIS — Z3041 Encounter for surveillance of contraceptive pills: Secondary | ICD-10-CM

## 2022-09-26 DIAGNOSIS — Z0000 Encounter for general adult medical examination without abnormal findings: Secondary | ICD-10-CM

## 2022-09-26 MED ORDER — NORGESTIMATE-ETH ESTRADIOL 0.25-35 MG-MCG PO TABS: 1.0000 | ORAL_TABLET | Freq: Every day | ORAL | 3 refills | Status: DC

## 2022-09-27 DIAGNOSIS — Z3041 Encounter for surveillance of contraceptive pills: Secondary | ICD-10-CM | POA: Insufficient documentation

## 2022-09-27 NOTE — Progress Notes (Signed)
GYNECOLOGY ANNUAL PREVENTATIVE CARE ENCOUNTER NOTE  Subjective:   Jenna [name] is a 31 y.o. G64P0010 female here for a routine annual gynecologic exam.  Current complaints: None.   Taking Sprintec with no problems but interested in Es[facility].   Does not have insurance right now.    Denies abnormal vaginal bleeding, discharge, pelvic pain, problems with intercourse or other gynecologic concerns.    Gynecologic History Patient's last menstrual period was 09/24/2022 (exact date). Contraception: OCP (estrogen/progesterone) Last Pap: 06/25/20. Results were: normal  Obstetric History OB History  Gravida Para Term Preterm AB Living  1       1    SAB IAB Ectopic Multiple Live Births  1            # Outcome Date GA Lbr Len/2nd Weight Sex Delivery Anes PTL Lv  1 SAB 2018            Past Medical History:  Diagnosis Date   Anxiety    Chronic diarrhea    Cyst of ovary 12/2017   Depression    Dysrhythmia    PVCs   GERD (gastroesophageal reflux disease)    Headache    stress related   Heart murmur    since childhood   Papillary thyroid carcinoma (HCC)    nodules on left lobe still- right side removed   Rapid palpitations 07/08/2015   SVT (supraventricular tachycardia)    last episode- 2 years ago   Vasovagal syncope 2011   no since then    Past Surgical History:  Procedure Laterality Date   THYROID LOBECTOMY Right 05/27/2020   Procedure: RIGHT THYROID LOBECTOMY;  Surgeon: Armandina Gemma, MD;  Location: [facility];  Service: General;  Laterality: Right;   WISDOM TOOTH EXTRACTION      Current Outpatient Medications on File Prior to Visit  Medication Sig Dispense Refill   pantoprazole (PROTONIX) 40 MG tablet Take 1 tablet (40 mg total) by mouth 2 (two) times daily. 180 tablet 3   valACYclovir (VALTREX) 1000 MG tablet Take 2 tabs by mouth now and again in 12 hrs 4 tablet 3   COVID-19 mRNA bivalent vaccine, Pfizer, (PFIZER COVID-19 VAC BIVALENT) injection Inject into the muscle. (Patient not  taking: Reported on 09/26/2022) 0.3 mL 0   Psyllium (METAMUCIL) 28.3 % POWD Take 1 dose by mouth qd; WILL PURCHASE OTC (Patient not taking: Reported on 09/26/2022) 575 g 0   No current facility-administered medications on file prior to visit.    Allergies  Allergen Reactions   Adhesive [Tape] Itching and Rash    Blistering of skin      Social History   Socioeconomic History   Marital status: Single    Spouse name: Not on file   Number of children: Not on file   Years of education: Not on file   Highest education level: Some college, no degree  Occupational History   Not on file  Tobacco Use   Smoking status: Every Day    Packs/day: 0.50    Years: 10.00    Total pack years: 5.00    Types: Cigarettes   Smokeless tobacco: Never   Tobacco comments:    7 cigarettes daily  Vaping Use   Vaping Use: Former   Start date: 05/20/2015   Quit date: 05/19/2017   Substances: Nicotine, Flavoring  Substance and Sexual Activity   Alcohol use: Yes    Alcohol/week: 1.0 standard drink of alcohol    Types: 1 Standard drinks or equivalent per week  Comment: 2 beers/week   Drug use: No   Sexual activity: Yes    Partners: Male    Birth control/protection: Pill    Comment: Patient declined to answer sexual history.  Other Topics Concern   Not on file  Social History Narrative   Lives in [place] with her mother, Codie Hainer, and her brother, Lamount Cranker.  Identifies as [place] and [place]. Endorses tobacco use as well as rare THC use. Denies ETOH use. + sexually active. Has implanon.     GED   Installs pools   Social Determinants of Health   Financial Resource Strain: Not on file  Food Insecurity: Not on file  Transportation Needs: Not on file  Physical Activity: Not on file  Stress: Not on file  Social Connections: Not on file  Intimate Partner Violence: Not on file    Family History  Problem Relation Age of Onset   Arrhythmia Maternal Grandmother    Hypertension Maternal  Grandfather    Diabetes Maternal Grandfather    Heart attack Maternal Grandfather    Heart disease Maternal Grandfather    Heart murmur Mother    Melanoma Mother        stage 3 malignant melanoma   Healthy Father    Healthy Brother    Scoliosis Brother    Other Maternal Aunt        TACHYCARDIA   Diabetes Paternal Grandfather    Arrhythmia Other        Aunt w/ tachyarrythmia at age 60   Cancer Other    Stomach cancer Other    Colon cancer Neg Hx    Esophageal cancer Neg Hx    Rectal cancer Neg Hx     The following portions of the patient's history were reviewed and updated as appropriate: allergies, current medications, past family history, past medical history, past social history, past surgical history and problem list.  Review of Systems Pertinent items noted in HPI and remainder of comprehensive ROS otherwise negative.   Objective:  BP 109/60   Pulse 62   Ht '5\' 4"'$  (1.626 m)   Wt 175 lb (79.4 kg)   LMP 09/24/2022 (Exact Date)   BMI 30.04 kg/m  CONSTITUTIONAL: Well-developed, well-nourished female in no acute distress.  HENT:  Normocephalic, atraumatic, External right and left ear normal. Oropharynx is clear and moist EYES: Conjunctivae and EOM are normal. Pupils are equal, round, and reactive to light. No scleral icterus.  NECK: Normal range of motion, supple, no masses.  Normal thyroid.  SKIN: Skin is warm and dry. No rash noted. Not diaphoretic. No erythema. No pallor. NEUROLOGIC: Alert and oriented to person, place, and time. Normal reflexes, muscle tone coordination. No cranial nerve deficit noted. PSYCHIATRIC: Normal mood and affect. Normal behavior. Normal judgment and thought content. CARDIOVASCULAR: Normal heart rate noted, regular rhythm RESPIRATORY: Clear to auscultation bilaterally. Effort and breath sounds normal, no problems with respiration noted. BREASTS: Symmetric in size. No masses, skin changes, nipple drainage, or lymphadenopathy. ABDOMEN: Soft,  normal bowel sounds, no distention noted.  No tenderness, rebound or guarding.  PELVIC: Normal appearing external genitalia; normal appearing vaginal mucosa and cervix.  No abnormal discharge noted.  Pap smear obtained.  Normal uterine size, no other palpable masses, no uterine or adnexal tenderness. MUSCULOSKELETAL: Normal range of motion. No tenderness.  No cyanosis, clubbing, or edema.  2+ distal pulses.   Assessment:  Annual gynecologic examination without pap smear   Plan:  Routine preventative health maintenance measures emphasized. Being  followed by endocrinologist for Post Thyroidectomy care Will call pharmacy to check price on Nuvaring.  Will call us if she wants to switch. Refilled Sprintec for now Please refer to After Visit Summary for other counseling recommendations.

## 2022-10-02 ENCOUNTER — Other Ambulatory Visit (HOSPITAL_BASED_OUTPATIENT_CLINIC_OR_DEPARTMENT_OTHER): Payer: Self-pay

## 2022-10-06 ENCOUNTER — Ambulatory Visit: Payer: Self-pay | Admitting: Cardiology

## 2022-10-09 ENCOUNTER — Other Ambulatory Visit (HOSPITAL_BASED_OUTPATIENT_CLINIC_OR_DEPARTMENT_OTHER): Payer: Self-pay

## 2022-10-19 ENCOUNTER — Other Ambulatory Visit (HOSPITAL_BASED_OUTPATIENT_CLINIC_OR_DEPARTMENT_OTHER): Payer: Self-pay

## 2022-11-06 ENCOUNTER — Other Ambulatory Visit (HOSPITAL_BASED_OUTPATIENT_CLINIC_OR_DEPARTMENT_OTHER): Payer: Self-pay

## 2023-01-03 ENCOUNTER — Encounter: Payer: Self-pay | Admitting: Internal Medicine

## 2023-01-03 ENCOUNTER — Ambulatory Visit: Payer: 59 | Admitting: Internal Medicine

## 2023-01-03 VITALS — BP 120/68 | HR 76 | Ht 64.0 in | Wt 179.2 lb

## 2023-01-03 DIAGNOSIS — Z9889 Other specified postprocedural states: Secondary | ICD-10-CM

## 2023-01-03 DIAGNOSIS — C73 Malignant neoplasm of thyroid gland: Secondary | ICD-10-CM

## 2023-01-03 LAB — T4, FREE: Free T4: 0.73 ng/dL (ref 0.60–1.60)

## 2023-01-03 LAB — T3, FREE: T3, Free: 4 pg/mL (ref 2.3–4.2)

## 2023-01-03 LAB — TSH: TSH: 1.81 u[IU]/mL (ref 0.35–5.50)

## 2023-01-03 NOTE — Patient Instructions (Signed)
Please stop at the lab.  Please come back for a follow-up appointment in 1 year.  

## 2023-01-03 NOTE — Progress Notes (Signed)
Patient ID: Jenna [name], female   DOB: [birth year], 32 y.o.   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  Last visit 1 year ago.  Interim history: At this visit, she feels well, without complaints other than increased neck pressure - upper neck. She still has occasional palpitations - she is on prn beta blockets. She is busy - in nursing school.  Reviewed history: Her nodule was found at the time of APE with her PCP.  At that time, PCP noted that she had a lump in her neck and sent her for a thyroid ultrasound.  The ultrasound showed a large right thyroid nodule and a complex right smaller nodule (see below).  Thyroid U/S (10/29/2018): Right inferior 2.1 x 1 x 1.3 cm solid, hypoechoic, thyroid nodule      12/03/2018: FNA:  Adequacy Reason Satisfactory For Evaluation. 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   Afirma molecular marker: Benign  12/01/2019: Thyroid U/S: CLINICAL DATA:  Prior ultrasound follow-up. History of right-sided thyroid nodule fine-needle aspiration performed 12/03/2018.  Parenchymal Echotexture: Mildly heterogenous Isthmus: Normal in size measures 0.3 cm in diameter Right lobe: Borderline enlarged measuring 6.8 x 1.4 x 2.0 cm, unchanged, previously, 6.7 x 1.9 x 2.0 cm Left lobe: Normal in size measuring 5.6 x 1.4 x 1.7 cm, unchanged previously, 5.0 x 1.4 x 1.7 cm  _________________________________________________________   The previously biopsied nodule within the inferior pole of the right lobe of the thyroid (labeled 1) is unchanged to slightly increased in size compared to the 10/2018 examination, currently measuring 2.4 x 1.7 x 1.2 cm,  previously, 2.1 x 1.3 x 1.0 cm. Correlation with previous biopsy results is advised.  _________________________________________________________   Nodule # 2: Prior biopsy: No Location: Right; Inferior Maximum size: 1.0 cm; Other 2 dimensions: 0.9 x 0.7 cm, previously, 0.9 x 0.8 x 0.7 cm Composition: solid/almost completely solid (2) Echogenicity: hypoechoic (2) Echogenic foci: punctate echogenic foci (3) ACR TI-RADS total points: 7. Change in features: Yes nodule now appears to contain punctate echogenic foci Change in ACR TI-RADS risk category: Yes previously characterized as a TR4 nodule, currently a TR5 nodule ACR TI-RADS recommendations:   **Given size (>/= 1.0 cm) and appearance, fine needle aspiration of this highly suspicious nodule should be considered based on TI-RADS criteria.   _________________________________________________________   There is an approximately 0.6 x 0.5 x 0.5 cm well-defined hypoechoic nodule within the mid aspect the right lobe of the thyroid which is unchanged in hind site compared to the 11/2018 examination and again does not meet imaging criteria to recommend percutaneous sampling or continued dedicated follow-up.   There is a punctate (approximately 0.2 cm) hypoechoic nodule within the left lobe of the thyroid which is unchanged in hind site compared to the 11/2018 examination and again does not meet imaging criteria to recommend percutaneous sampling or continued dedicated follow-up.   IMPRESSION: 1. Findings suggestive of multinodular goiter. No definitive new worrisome thyroid nodules. 2. Nodule #2 now appears to contain punctate echogenic foci and as such has been up graded from a TR4 nodule to a TR5 nodule and now meets imaging criteria to recommend percutaneous sampling as clinically indicated. 3. Previously biopsied nodule within the inferior pole of the right lobe of  the thyroid is unchanged to slightly increased in  size compared to the 10/2018 examination, currently measuring 2.4 cm, previously, 2.1 cm. Correlation with previous biopsy results is advised. Regardless of slight increase in size, assuming a benign pathologic diagnosis, repeat sampling and/or continued dedicated follow-up is not recommended.  12/02/2019: Thyroid Uptake and scan: Hot nodule at inferior pole of RIGHT thyroid lobe with mild suppression of uptake throughout remaining thyroid tissue. No additional areas of increased or decreased tracer localization.   4 hour I-123 uptake = 14.5% (normal 5-20%)   24 hour I-123 uptake = 22.6% (normal 10-30%)   IMPRESSION: Normal 4 hour and 24 hour radio iodine uptakes.   Hot nodule at inferior pole RIGHT thyroid lobe with mild suppression of uptake of tracer within remaining thyroid tissue consistent with hyperfunctional RIGHT inferior thyroid adenoma.  01/27/2020: FNA of the 1 x 0.9 x 0.7 cm right inferior nodule: FLUS (Bethesda category 3)  Afirma: Suspicious  05/27/2020: Right thyroid lobectomy with isthmusectomy by Dr. Harlow Asa: FINAL MICROSCOPIC DIAGNOSIS:   A. 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  - No extrathyroidal extension identified.  - Margins of resection are not involved.  - See oncology table.   Procedure: Right hemithyroidectomy  Tumor Focality: Multifocal  Tumor Site: Right lobe  Tumor Size: Greatest dimension: 0.6 cm  Histologic Type: Papillary carcinoma  Margins: Uninvolved by tumor  Angioinvasion: Not identified  Lymphatic Invasion: Not identified  Extrathyroidal Extension: Not identified  Regional Lymph Nodes: No lymph nodes submitted or found  Pathologic Stage Classification (pTNM, AJCC 8th Edition): mpT1a, pNX  Representative Tumor Block: A6  Comment: Dr. Tresa Moore reviewed the case.   Neck U/S (01/10/2022): 1. No significant abnormality of the right thyroidectomy  bed. 2. No suspicious left thyroid nodules.  Pt denies: - feeling nodules in neck - hoarseness - dysphagia - choking  She is not on levothyroxine.  Reviewed her TFTs: Lab Results  Component Value Date   TSH 1.95 12/28/2021   TSH 1.094 09/05/2021   TSH 1.46 07/06/2021   TSH 2.21 12/23/2020   TSH 3.78 06/17/2020   TSH 0.39 11/11/2019   TSH 0.43 10/11/2018   TSH 0.49 07/27/2017   TSH 0.50 12/05/2016   TSH 0.96 11/29/2015   FREET4 0.80 12/28/2021   FREET4 0.68 07/06/2021   FREET4 0.70 12/23/2020   FREET4 0.70 06/17/2020   FREET4 0.83 11/11/2019    + FH of thyroid ds in mother: multiple thyroid nodules - benign, MGGM - also thyroid nodule. No FH of thyroid cancer. No h/o radiation tx to head or neck. No herbal supplements. No Biotin use. No recent steroids use.  Pt also has a history of SVT (with chronic palpitations)- on beta blocker, heart murmur, HL. She is on Protonix for gastroduodenitis.  ROS: + see HPI  I reviewed pt's medications, allergies, PMH, social hx, family hx, and changes were documented in the history of present illness. Otherwise, unchanged from my initial visit note.  Past Medical History:  Diagnosis Date   Anxiety    Chronic diarrhea    Cyst of ovary 12/2017   Depression    Dysrhythmia    PVCs   GERD (gastroesophageal reflux disease)    Headache    stress related   Heart murmur    since childhood   Papillary thyroid carcinoma (HCC)    nodules on left lobe still- right side removed   Rapid palpitations 07/08/2015   SVT (  supraventricular tachycardia)    last episode- 2 years ago   Vasovagal syncope 2011   no since then   Past Surgical History:  Procedure Laterality Date   THYROID LOBECTOMY Right 05/27/2020   Procedure: RIGHT THYROID LOBECTOMY;  Surgeon: Armandina Gemma, MD;  Location: [facility];  Service: General;  Laterality: Right;   WISDOM TOOTH EXTRACTION     Social History   Socioeconomic History   Marital status: Single    Spouse  name: Not on file   Number of children: 0   Years of education: Not on file   Highest education level: Not on file  Occupational History    Physiological scientist  Social Needs   Financial resource strain: Not on file   Food insecurity:    Worry: Not on file    Inability: Not on file   Transportation needs:    Medical: Not on file    Non-medical: Not on file  Tobacco Use   Smoking status: Current Every Day Smoker    Packs/day: 1.00    Types: Cigarettes   Smokeless tobacco: Never Used   Tobacco comment: 7 cigarettes daily  Substance and Sexual Activity   Alcohol use: Yes    Alcohol/week:     Types:     Comment: Rarely, beer, 3 drinks   Drug use: No  Lifestyle   Physical activity:    Days per week: Not on file    Minutes per session: Not on file   Stress: Not on file  Social History Narrative   Lives in [place] with her mother, Havilah Topor, and her brother, Lamount Cranker.  Identifies as [place]. Endorses tobacco use as well as rare THC use. Denies ETOH use. + sexually active. Has implanon.     GED   Installs pools   Current Outpatient Medications on File Prior to Visit  Medication Sig Dispense Refill   COVID-19 mRNA bivalent vaccine, Pfizer, (PFIZER COVID-19 VAC BIVALENT) injection Inject into the muscle. (Patient not taking: Reported on 09/26/2022) 0.3 mL 0   influenza vac split quadrivalent PF (FLUARIX QUADRIVALENT) 0.5 ML injection      norgestimate-ethinyl estradiol (SPRINTEC 28) 0.25-35 MG-MCG tablet Take 1 tablet by mouth daily. 84 tablet 3   pantoprazole (PROTONIX) 40 MG tablet Take 1 tablet (40 mg total) by mouth 2 (two) times daily. 180 tablet 3   Psyllium (METAMUCIL) 28.3 % POWD Take 1 dose by mouth qd; WILL PURCHASE OTC (Patient not taking: Reported on 09/26/2022) 575 g 0   valACYclovir (VALTREX) 1000 MG tablet Take 2 tabs by mouth now and again in 12 hrs 4 tablet 3   No current facility-administered medications on file prior to visit.   Allergies   Allergen Reactions   Adhesive [Tape] Itching and Rash    Blistering of skin     Family History  Problem Relation Age of Onset   Arrhythmia Maternal Grandmother    Hypertension Maternal Grandfather    Diabetes Maternal Grandfather    Heart attack Maternal Grandfather    Heart disease Maternal Grandfather    Heart murmur Mother    Melanoma Mother        stage 3 malignant melanoma   Healthy Father    Healthy Brother    Scoliosis Brother    Other Maternal Aunt        TACHYCARDIA   Diabetes Paternal Grandfather    Arrhythmia Other        Aunt w/ tachyarrythmia at age 16  Cancer Other    Stomach cancer Other    Colon cancer Neg Hx    Esophageal cancer Neg Hx    Rectal cancer Neg Hx    PE: BP 120/68 (BP Location: Right Arm, Patient Position: Sitting, Cuff Size: Normal)   Pulse 76   Ht '5\' 4"'$  (1.626 m)   Wt 179 lb 3.2 oz (81.3 kg)   SpO2 98%   BMI 30.76 kg/m  Wt Readings from Last 3 Encounters:  01/03/23 179 lb 3.2 oz (81.3 kg)  09/26/22 175 lb (79.4 kg)  09/08/22 174 lb (78.9 kg)   Constitutional: Slightly overweight, in NAD Eyes: EOMI, no exophthalmos ENT: no neck masses palpated, thyroidectomy scar without keloid, inconspicuous, no cervical lymphadenopathy Cardiovascular: RRR, No MRG Respiratory: CTA B Musculoskeletal: no deformities Skin:  no rashes Neurological: no tremor with outstretched hands  ASSESSMENT: 1.  Papillary thyroid cancer -Multifocal, subcentimeter  2. S/p right thyroid lobectomy  3. Subclinical hyperthyroidism -Resolved after thyroid surgery  PLAN: 1.  Papillary thyroid cancer -Patient with history of thyroid nodules, of which the dominant nodule was fairly large, hypoechoic, but without other worrisome characteristics.  The nodule appears to be hide-thyroid uptake and scan.  However, on the thyroid ultrasound from 11/2019 she also had a small, right inferior thyroid nodule, measuring 1 cm in the largest dimension, which appears to be  solid, hypoechoic, with microcalcifications.  We biopsied this nodule with inconclusive results in 01/2020, however, the Afirma molecular marker returned suspicious.  Therefore, I have referred her to surgery for hemithyroidectomy.  Final pathology showed papillary thyroid cancer, multifocal, with the largest focus of 0.6 cm, but without lymphovascular extension or other worrisome characteristics.  Therefore, she has stage I, low risk, thyroid cancer.  The cancer was very small so I did not suggest completion thyroidectomy and RAI treatment.  We are following her clinically and with neck ultrasounds. -We repeated a neck ultrasound at last visit and this showed no abnormal masses. -She has no neck compression symptoms and no masses felt on palpation of her neck today -Thyroglobulin measurements are not indicated in follow-up for patients undergoing hemithyroidectomy, without RAI treatment, due to fluctuating levels -At today's visit, we will recheck her TFTs -I will see her back in 1 year  2.  S/p right lobectomy + isthmusectomy -She did have a keloid after the surgical scar healed so I advised her to use Kelo-Cote strips.  These worked very well for her so as of now, there is no keloid at the scar site -Also, no dysesthesia or pain at the surgical scar -We will recheck her TFTs today and see if she needs levothyroxine supplementation -Approximately 25% of patients may require levothyroxine after thyroidectomy but there is decreasing significantly over time -At today's visit, she does describe more fatigue, but she is also very busy. -No plans for pregnancy within the next 2 years.  Component     Latest Ref Rng 01/03/2023  TSH     0.35 - 5.50 uIU/mL 1.81   T4,Free(Direct)     0.60 - 1.60 ng/dL 0.73   Triiodothyronine,Free,Serum     2.3 - 4.2 pg/mL 4.0   TFTs are excellent.  Philemon Kingdom, MD PhD [facility]

## 2023-01-23 ENCOUNTER — Other Ambulatory Visit (HOSPITAL_BASED_OUTPATIENT_CLINIC_OR_DEPARTMENT_OTHER): Payer: Self-pay

## 2023-01-29 ENCOUNTER — Other Ambulatory Visit (HOSPITAL_BASED_OUTPATIENT_CLINIC_OR_DEPARTMENT_OTHER): Payer: Self-pay

## 2023-02-27 ENCOUNTER — Other Ambulatory Visit (HOSPITAL_BASED_OUTPATIENT_CLINIC_OR_DEPARTMENT_OTHER): Payer: Self-pay

## 2023-03-16 ENCOUNTER — Other Ambulatory Visit (HOSPITAL_BASED_OUTPATIENT_CLINIC_OR_DEPARTMENT_OTHER): Payer: Self-pay

## 2023-04-10 ENCOUNTER — Other Ambulatory Visit (HOSPITAL_BASED_OUTPATIENT_CLINIC_OR_DEPARTMENT_OTHER): Payer: Self-pay

## 2023-05-10 ENCOUNTER — Other Ambulatory Visit (HOSPITAL_BASED_OUTPATIENT_CLINIC_OR_DEPARTMENT_OTHER): Payer: Self-pay

## 2023-07-12 ENCOUNTER — Other Ambulatory Visit (HOSPITAL_BASED_OUTPATIENT_CLINIC_OR_DEPARTMENT_OTHER): Payer: Self-pay

## 2023-07-17 ENCOUNTER — Ambulatory Visit (INDEPENDENT_AMBULATORY_CARE_PROVIDER_SITE_OTHER): Payer: 59 | Admitting: Family

## 2023-07-17 ENCOUNTER — Encounter: Payer: Self-pay | Admitting: Family

## 2023-07-17 VITALS — BP 111/64 | HR 64 | Temp 98.1°F | Resp 16 | Ht 64.0 in | Wt 174.0 lb

## 2023-07-17 DIAGNOSIS — Z720 Tobacco use: Secondary | ICD-10-CM | POA: Diagnosis not present

## 2023-07-17 DIAGNOSIS — G43909 Migraine, unspecified, not intractable, without status migrainosus: Secondary | ICD-10-CM | POA: Diagnosis not present

## 2023-07-17 DIAGNOSIS — F419 Anxiety disorder, unspecified: Secondary | ICD-10-CM

## 2023-07-17 DIAGNOSIS — Z0001 Encounter for general adult medical examination with abnormal findings: Secondary | ICD-10-CM

## 2023-07-17 DIAGNOSIS — E785 Hyperlipidemia, unspecified: Secondary | ICD-10-CM

## 2023-07-17 DIAGNOSIS — Z23 Encounter for immunization: Secondary | ICD-10-CM

## 2023-07-17 DIAGNOSIS — T148XXA Other injury of unspecified body region, initial encounter: Secondary | ICD-10-CM

## 2023-07-17 DIAGNOSIS — Z0000 Encounter for general adult medical examination without abnormal findings: Secondary | ICD-10-CM

## 2023-07-17 DIAGNOSIS — C73 Malignant neoplasm of thyroid gland: Secondary | ICD-10-CM

## 2023-07-17 MED ORDER — VARENICLINE TARTRATE (STARTER) 0.5 MG X 11 & 1 MG X 42 PO TBPK: ORAL_TABLET | ORAL | 0 refills | Status: DC

## 2023-07-17 NOTE — Assessment & Plan Note (Signed)
Reports smoking approximately 5-7 cigarettes per day. Expressed interest in quitting. -Start Chantix, follow up in three weeks to assess tolerance and effectiveness.

## 2023-07-17 NOTE — Assessment & Plan Note (Signed)
Reports symptoms are currently manageable without medication.

## 2023-07-17 NOTE — Patient Instructions (Signed)
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 We also reviewed your general health maintenance.  YOUR PLAN:  -FREQUENT HEADACHES: You've been experiencing headaches almost daily. I've referred you to a neurologist for further evaluation and advised you to reduce the use of over-the-counter pain medications to avoid worsening your headaches.  -TOBACCO USE: You've expressed a desire to quit smoking. I've prescribed Chantix to help with this. We'll check in three weeks to see how you're tolerating it and if it's helping.  -WEIGHT MANAGEMENT: Despite your active lifestyle and healthy diet, you've been struggling to lose weight. I've suggested tracking your daily caloric intake and reducing it by 300 calories per day, but not going below 1200 calories per day. I've also advised against weight loss injections due to your history of thyroid cancer.  -INCOMPLETE HPV VACCINATION: You've received two doses of the HPV vaccine but did not complete the series. I've administered the third dose today.  -GENERAL HEALTH MAINTENANCE: I've ordered labs to check your cholesterol, glucose, liver function, and iron levels. I've also encouraged you to schedule a gynecology appointment for a Pap smear. Continue taking Sprintec for birth control. We'll follow up in one year, or sooner if any issues arise.  INSTRUCTIONS:  Please schedule an appointment with a neurologist for your headaches. Start taking the prescribed Chantix and monitor any changes or side effects. Begin tracking your daily caloric intake and aim to reduce it by 300 calories per day. Schedule a gynecology appointment for a Pap smear. Continue taking Sprintec for birth control. We'll follow up in three weeks to check on your progress with quitting smoking and in one year for a general check-up, or sooner if any  issues arise.

## 2023-07-17 NOTE — Assessment & Plan Note (Addendum)
Reports frequent headaches, not typically migraines, but throbbing in nature. No clear triggers identified. Previous use of various over-the-counter pain medications. -Referral to neurology for further evaluation and management. -Advised to reduce use of over-the-counter pain medications to avoid rebound headaches.

## 2023-07-17 NOTE — Assessment & Plan Note (Signed)
S/p partial thyroidectomy, following with Endocrinology.

## 2023-07-17 NOTE — Assessment & Plan Note (Addendum)
Discussed healthy diet, exercise, weight loss.  Reports difficulty losing weight despite active lifestyle and healthy diet. History of thyroidectomy, but recent thyroid function tests are normal. -Encouraged to track daily caloric intake and reduce by 300 calories per day, while maintaining intake above 1200 calories per day. -Advised against GLP-1 weight loss injections due to history of thyroid cancer.  General Health Maintenance: -Order labs to check cholesterol, glucose, liver function, and iron levels. -Encouraged to schedule gynecology appointment for Pap smear. -Continue Sprintec for birth control. -Plan to follow up in one year, or sooner if issues arise.

## 2023-07-17 NOTE — Progress Notes (Signed)
Subjective:     Patient ID: Jenna [name], female    DOB: 1991/12/04, 32 y.o.   MRN: [id number]  No chief complaint on file.   HPI  Discussed the use of AI scribe software for clinical note transcription with the patient, who gave verbal consent to proceed.  History of Present Illness   The patient reports experiencing headaches almost daily, describing them as throbbing and pressure-like, but not typically at the level of a migraine. The patient has tried various over-the-counter medications for relief, including Tylenol, Excedrin, and Goody's, but the headaches persist.  The patient also expresses frustration with an inability to lose weight despite maintaining an active lifestyle and a healthy diet. 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 The patient reports eating a diet rich in vegetables and lean proteins, and avoids fried foods. Despite these efforts, the patient has been unable to lose weight and has maintained the same weight for the past two years.  The patient also expresses a desire to quit smoking. The patient currently smokes approximately a pack of cigarettes every three to four days. The patient has previously tried Wellbutrin for smoking cessation with some success, but has not been able to quit completely.  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 The patient reports some confusion and frustration with the process, particularly regarding her varicella titer. this is being managed by an outside company.      Wt Readings from Last 3 Encounters:  07/17/23 174 lb (78.9 kg)  01/03/23 179 lb 3.2 oz (81.3 kg)  09/26/22 175 lb (79.4 kg)   Patient presents today for complete physical.  Immunizations:  Diet: healthy Exercise: active Pap Smear:  will schedule with [facility]: due Dental: due     Health Maintenance Due   Topic Date Due   COVID-19 Vaccine (2 - Pfizer risk series) 08/08/2022   PAP SMEAR-Modifier  07/16/2023    Past Medical History:  Diagnosis Date   Anxiety    Chronic diarrhea    Cyst of ovary 12/2017   Depression    Dysrhythmia    PVCs   GERD (gastroesophageal reflux disease)    Headache    stress related   Heart murmur    since childhood   Papillary thyroid carcinoma (HCC)    nodules on left lobe still- right side removed   Rapid palpitations 07/08/2015   SVT (supraventricular tachycardia)    last episode- 2 years ago   Vasovagal syncope 2011   no since then    Past Surgical History:  Procedure Laterality Date   THYROID LOBECTOMY Right 05/27/2020   Procedure: RIGHT THYROID LOBECTOMY;  Surgeon: Darnell Level, MD;  Location: [facility];  Service: General;  Laterality: Right;   WISDOM TOOTH EXTRACTION      Family History  Problem Relation Age of Onset   Arrhythmia Maternal Grandmother    Hypertension Maternal Grandfather    Diabetes Maternal Grandfather    Heart attack Maternal Grandfather    Heart disease Maternal Grandfather    Heart murmur Mother    Melanoma Mother        stage 3 malignant melanoma   Healthy Father    Healthy Brother    Scoliosis Brother    Other Maternal Aunt        TACHYCARDIA   Diabetes Paternal Grandfather  Arrhythmia Other        Aunt w/ tachyarrythmia at age 36   Cancer Other    Stomach cancer Other    Colon cancer Neg Hx    Esophageal cancer Neg Hx    Rectal cancer Neg Hx     Social History   Socioeconomic History   Marital status: Single    Spouse name: Not on file   Number of children: Not on file   Years of education: Not on file   Highest education level: Some college, no degree  Occupational History   Not on file  Tobacco Use   Smoking status: Every Day    Current packs/day: 0.50    Average packs/day: 0.5 packs/day for 10.0 years (5.0 ttl pk-yrs)    Types: Cigarettes   Smokeless tobacco: Never   Tobacco comments:     7 cigarettes daily  Vaping Use   Vaping status: Former   Start date: 05/20/2015   Quit date: 05/19/2017   Substances: Nicotine, Flavoring  Substance and Sexual Activity   Alcohol use: Yes    Alcohol/week: 1.0 standard drink of alcohol    Types: 1 Standard drinks or equivalent per week    Comment: 02 drinks/month   Drug use: No   Sexual activity: Yes    Partners: Male    Birth control/protection: Pill, OCP    Comment: Patient declined to answer sexual history.  Other Topics Concern   Not on file  Social History Narrative   Lives in [place]  with her boyfriend.   Identifies as [place]. Endorses tobacco use as well as rare THC use.    Denies ETOH use. + sexually active.      Starting nursing program at [employer]   Social Determinants of Health   Financial Resource Strain: Not on file  Food Insecurity: Not on file  Transportation Needs: Not on file  Physical Activity: Not on file  Stress: Not on file  Social Connections: Not on file  Intimate Partner Violence: Not on file    Outpatient Medications Prior to Visit  Medication Sig Dispense Refill   norgestimate-ethinyl estradiol (SPRINTEC 28) 0.25-35 MG-MCG tablet Take 1 tablet by mouth daily. 84 tablet 3   valACYclovir (VALTREX) 1000 MG tablet Take 2 tabs by mouth now and again in 12 hrs 4 tablet 3   COVID-19 mRNA bivalent vaccine, Pfizer, (PFIZER COVID-19 VAC BIVALENT) injection Inject into the muscle. (Patient not taking: Reported on 09/26/2022) 0.3 mL 0   influenza vac split quadrivalent PF (FLUARIX QUADRIVALENT) 0.5 ML injection      pantoprazole (PROTONIX) 40 MG tablet Take 1 tablet (40 mg total) by mouth 2 (two) times daily. 180 tablet 3   No facility-administered medications prior to visit.    Allergies  Allergen Reactions   Adhesive [Tape] Itching and Rash    Blistering of skin      Review of Systems  Constitutional:  Negative for weight loss.  HENT:  Negative for congestion and hearing  loss.   Eyes:  Negative for blurred vision.  Respiratory:  Negative for cough.   Cardiovascular:  Negative for leg swelling.  Gastrointestinal:  Positive for constipation (sometimes, has a tortuous colon). Negative for diarrhea.  Genitourinary:  Negative for dysuria and frequency.  Musculoskeletal:  Negative for joint pain and myalgias.  Skin:  Negative for rash.  Neurological:  Positive for headaches (uses excedrin,ibuprofen, tylenol.).       Objective:    Physical  Exam   BP 111/64 (BP Location: Left Arm, Patient Position: Sitting, Cuff Size: Small)   Pulse 64   Temp 98.1 F (36.7 C) (Oral)   Resp 16   Ht 5\' 4"  (1.626 m)   Wt 174 lb (78.9 kg)   SpO2 100%   BMI 29.87 kg/m  Wt Readings from Last 3 Encounters:  07/17/23 174 lb (78.9 kg)  01/03/23 179 lb 3.2 oz (81.3 kg)  09/26/22 175 lb (79.4 kg)      Physical Exam  Constitutional: She is oriented to person, place, and time. She appears well-developed and well-nourished. No distress.  HENT:  Head: Normocephalic and atraumatic.  Right Ear: Tympanic membrane and ear canal normal.  Left Ear: Tympanic membrane and ear canal normal.  Mouth/Throat: Oropharynx is clear and moist.  Eyes: Pupils are equal, round, and reactive to light. No scleral icterus.  Neck: Normal range of motion. No thyromegaly present.  Cardiovascular: Normal rate and regular rhythm.   No murmur heard. Pulmonary/Chest: Effort normal and breath sounds normal. No respiratory distress. He has no wheezes. She has no rales. She exhibits no tenderness.  Abdominal: Soft. Bowel sounds are normal. She exhibits no distension and no mass. There is no tenderness. There is no rebound and no guarding.  Musculoskeletal: She exhibits no edema.  Lymphadenopathy:    She has no cervical adenopathy.  Neurological: She is alert and oriented to person, place, and time. She has normal patellar reflexes. She exhibits normal muscle tone. Coordination normal.  Skin: Skin is warm  and dry.  Psychiatric: She has a normal mood and affect. Her behavior is normal. Judgment and thought content normal.  Breast/pelvic: deferred          Assessment & Plan:    Assessment & Plan:   Problem List Items Addressed This Visit       Unprioritized   Tobacco abuse    Reports smoking approximately 5-7 cigarettes per day. Expressed interest in quitting. -Start Chantix, follow up in three weeks to assess tolerance and effectiveness.      Preventative health care - Primary    Discussed healthy diet, exercise, weight loss.  Reports difficulty losing weight despite active lifestyle and healthy diet. History of thyroidectomy, but recent thyroid function tests are normal. -Encouraged to track daily caloric intake and reduce by 300 calories per day, while maintaining intake above 1200 calories per day. -Advised against GLP-1 weight loss injections due to history of thyroid cancer.  General Health Maintenance: -Order labs to check cholesterol, glucose, liver function, and iron levels. -Encouraged to schedule gynecology appointment for Pap smear. -Continue Sprintec for birth control. -Plan to follow up in one year, or sooner if issues arise.      Papillary thyroid carcinoma [facility])    S/p partial thyroidectomy, following with Endocrinology.       Relevant Orders   TSH   T4, free   T3, free   Migraines    Reports frequent headaches, not typically migraines, but throbbing in nature. No clear triggers identified. Previous use of various over-the-counter pain medications. -Referral to neurology for further evaluation and management. -Advised to reduce use of over-the-counter pain medications to avoid rebound headaches.      Relevant Orders   Ambulatory referral to Neurology   Hyperlipidemia   Relevant Orders   Comp Met (CMET)   Lipid panel   Anxiety    Reports symptoms are currently manageable without medication.       Other Visit Diagnoses  Need for HPV vaccine        Relevant Orders   HPV 9-valent vaccine,Recombinat (Completed)   Bruising       Relevant Orders   CBC w/Diff   Iron, TIBC and Ferritin Panel   Need for diphtheria-tetanus-pertussis (Tdap) vaccine       Relevant Orders   Tdap vaccine greater than or equal to 7yo IM (Completed)       I have discontinued Ruweyda A. Smola's pantoprazole, Pfizer COVID-19 Vac Bivalent, and Fluarix Quadrivalent. I am also having her start on Varenicline Tartrate (Starter). Additionally, I am having her maintain her valACYclovir and norgestimate-ethinyl estradiol.  Meds ordered this encounter  Medications   Varenicline Tartrate, Starter, (CHANTIX STARTING MONTH PAK) 0.5 MG X 11 & 1 MG X 42 TBPK    Sig: Take one 0.5 mg tablet by mouth once daily for 3 days, then increase to one 0.5 mg tablet twice daily for 4 days, then increase to one 1 mg tablet twice daily.    Dispense:  53 each    Refill:  0    Order Specific Question:   Supervising Provider    Answer:   Danise Edge A [[id number]]

## 2023-07-18 ENCOUNTER — Other Ambulatory Visit (INDEPENDENT_AMBULATORY_CARE_PROVIDER_SITE_OTHER): Payer: 59

## 2023-07-18 ENCOUNTER — Telehealth: Payer: Self-pay | Admitting: *Deleted

## 2023-07-18 DIAGNOSIS — C73 Malignant neoplasm of thyroid gland: Secondary | ICD-10-CM

## 2023-07-18 DIAGNOSIS — T148XXA Other injury of unspecified body region, initial encounter: Secondary | ICD-10-CM

## 2023-07-18 DIAGNOSIS — E785 Hyperlipidemia, unspecified: Secondary | ICD-10-CM

## 2023-07-18 NOTE — Addendum Note (Signed)
Addended by: Thelma Barge D on: 07/18/2023 10:18 AM   Modules accepted: Orders

## 2023-07-18 NOTE — Telephone Encounter (Signed)
Received call from [facility] that they received 2 sets of tubes on patient.  They will cancel orders and advised that we will contact patient for redraw.

## 2023-07-18 NOTE — Telephone Encounter (Signed)
Pt came back in

## 2023-07-18 NOTE — Telephone Encounter (Signed)
Left message on machine that we need for pt to return for redraw.

## 2023-07-19 ENCOUNTER — Encounter: Payer: Self-pay | Admitting: Family

## 2023-07-22 ENCOUNTER — Encounter: Payer: Self-pay | Admitting: Family

## 2023-07-27 ENCOUNTER — Other Ambulatory Visit (HOSPITAL_BASED_OUTPATIENT_CLINIC_OR_DEPARTMENT_OTHER): Payer: Self-pay

## 2023-08-01 ENCOUNTER — Other Ambulatory Visit (HOSPITAL_BASED_OUTPATIENT_CLINIC_OR_DEPARTMENT_OTHER): Payer: Self-pay

## 2023-08-09 ENCOUNTER — Other Ambulatory Visit (HOSPITAL_BASED_OUTPATIENT_CLINIC_OR_DEPARTMENT_OTHER): Payer: Self-pay

## 2023-09-28 ENCOUNTER — Other Ambulatory Visit: Payer: Self-pay

## 2023-09-28 DIAGNOSIS — Z3041 Encounter for surveillance of contraceptive pills: Secondary | ICD-10-CM

## 2023-09-28 MED ORDER — NORGESTIMATE-ETH ESTRADIOL 0.25-35 MG-MCG PO TABS: 1.0000 | ORAL_TABLET | Freq: Every day | ORAL | 3 refills | Status: DC

## 2023-10-25 ENCOUNTER — Other Ambulatory Visit: Payer: Self-pay | Admitting: Family

## 2023-10-25 ENCOUNTER — Encounter: Payer: Self-pay | Admitting: Family

## 2023-10-25 MED ORDER — ROSUVASTATIN CALCIUM 5 MG PO TABS: 5.0000 mg | ORAL_TABLET | Freq: Every day | ORAL | 3 refills | Status: AC

## 2023-11-06 ENCOUNTER — Other Ambulatory Visit (HOSPITAL_COMMUNITY)
Admission: RE | Admit: 2023-11-06 | Discharge: 2023-11-06 | Disposition: A | Discharge status: Home / Self Care | Payer: 59 | Source: Ambulatory Visit

## 2023-11-06 ENCOUNTER — Ambulatory Visit (INDEPENDENT_AMBULATORY_CARE_PROVIDER_SITE_OTHER): Payer: 59

## 2023-11-06 VITALS — BP 118/67 | HR 63 | Ht 64.0 in | Wt 173.0 lb

## 2023-11-06 DIAGNOSIS — Z124 Encounter for screening for malignant neoplasm of cervix: Secondary | ICD-10-CM

## 2023-11-06 DIAGNOSIS — Z01419 Encounter for gynecological examination (general) (routine) without abnormal findings: Secondary | ICD-10-CM | POA: Diagnosis not present

## 2023-11-06 DIAGNOSIS — Z1151 Encounter for screening for human papillomavirus (HPV): Secondary | ICD-10-CM | POA: Diagnosis not present

## 2023-11-06 DIAGNOSIS — Z1239 Encounter for other screening for malignant neoplasm of breast: Secondary | ICD-10-CM

## 2023-11-06 NOTE — Progress Notes (Addendum)
GYNECOLOGY OFFICE VISIT NOTE-WELL WOMAN EXAM  History:   Jenna [name] is a 32 year old female here today for annual exam. Patient denies current issues.  She reports monthly cycle that lasts 4-5 days.  She endorses clots and cramping that is relieved with Aleve or Pamprin.  She denies any abnormal vaginal discharge, bleeding, or pelvic pain.   Birth Control:  Medical laboratory scientific officer; Satisfied   Reproductive Concerns Sexually Active: Yes Partners Type: Female Number of partners in last year: One STD Testing: Declines  Obstetrical History: G1P0010  Gynecological History: No significant  Vaginal/GU Concerns: She reports some pain with sex that is sometimes relieved with position change.  Patient states that it feels like vaginal dryness.  She does not use lubrication.   Breast Concerns/Exams: No issues/concerns.  States she tries to do breast exams. Patient denies family history of breast, uterine, cervical, or ovarian cancer.  She does report that her mother has a history of left breast masses.  Medical and Nutrition PCP: Sandford Craze - Last appt Aug 2024  Significant PMx: H/O Partial Thyroidectomy d/t Papillary Thyroid Carcinoma, Hyperthyroidism, Hyperlipidemia  Exercise: Reports she has strenuous job. Tobacco/Drugs/Alcohol: Smokes cigarettes 1 pack 2-3 days. Drinks 2x/mth. Nutrition:  Reports intake is "getting better." Reports she is drinking more water and has decreased intake of fried food and eating out.   Social Safety at home: Endorses. 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)  Past Medical History:  Diagnosis Date   Anxiety    Chronic diarrhea    Cyst of ovary 12/2017   Depression    Dysrhythmia    PVCs   GERD (gastroesophageal reflux disease)    Headache    stress related   Heart murmur    since childhood   Hyperlipidemia    Papillary thyroid carcinoma (HCC)    nodules on left lobe still-  right side removed   Rapid palpitations 07/08/2015   SVT (supraventricular tachycardia) (HCC)    last episode- 2 years ago   Vasovagal syncope 2011   no since then    Past Surgical History:  Procedure Laterality Date   THYROID LOBECTOMY Right 05/27/2020   Procedure: RIGHT THYROID LOBECTOMY;  Surgeon: Darnell Level, MD;  Location: [facility];  Service: General;  Laterality: Right;   WISDOM TOOTH EXTRACTION      The following portions of the patient's history were reviewed and updated as appropriate: allergies, current medications, past family history, past medical history, past social history, past surgical history and problem list.   Health Maintenance: Pap: July 2024, Negative Results.  Mammogram: None on file d/t Age. Colonoscopy: N/A d/t Age Review of Systems:  Pertinent items noted in HPI and remainder of comprehensive ROS otherwise negative.    Objective:    Physical Exam BP 118/67   Pulse 63   Ht 5\' 4"  (1.626 m)   Wt 173 lb (78.5 kg)   LMP 10/18/2023 (Exact Date)   BMI 29.70 kg/m  Physical Exam Vitals and nursing note reviewed. Exam conducted with a chaperone present Leavy Cella, Charity fundraiser).  Constitutional:      Appearance: Normal appearance.  HENT:     Head: Normocephalic and atraumatic.  Eyes:     Conjunctiva/sclera: Conjunctivae normal.  Cardiovascular:     Rate and Rhythm: Normal rate and regular rhythm.  Pulmonary:     Effort: Pulmonary effort is normal. No respiratory distress.     Breath sounds: Normal breath  sounds.  Abdominal:     Palpations: Abdomen is soft.     Tenderness: There is no abdominal tenderness.  Genitourinary:    General: Normal vulva.     Comments: Speculum Exam: -Normal External Genitalia: Non tender, no apparent discharge at introitus.  -Vaginal Vault: Pink mucosa with good rugae. Scant amt mucoid whitish yellow discharge. -Cervix:Pink, no lesions, cysts, or polyps.  Appears closed. No active bleeding from os. Pap collected with brush and spatula.   -Bimanual Exam:  No tenderness in cul de sac.  Uterine size appropriate.  Right adnexa normal and w/o tenderness. Left adnexa not appreciated.  Musculoskeletal:        General: Normal range of motion.     Cervical back: Normal range of motion.  Skin:    General: Skin is warm and dry.  Neurological:     Mental Status: She is alert and oriented to person, place, and time.  Psychiatric:        Mood and Affect: Mood normal.        Behavior: Behavior normal.      Labs and Imaging No results found for this or any previous visit (from the past 168 hour(s)). No results found.    Assessment & Plan:  32 year old female Well Woman Exam with Pap Smear Breast Exam  1. Well woman exam with routine gynecological exam -Exam performed and findings discussed. -Encouraged to activate and utilize Mychart for reviewing of results, communication with office, and scheduling of appts. -Educated on AHA exercise recommendations of 30 minutes of moderate to vigorous activity at least 5x/week. -Discussed usage of lubrication during sexual activity to decrease discomfort.   - Cytology - PAP( [place])  2. Pap smear for cervical cancer screening -Educated on ASCCP guidelines regarding pap smear evaluation and frequency. -Informed of turnover time and provider/clinic policy on releasing results. - Cytology - PAP( [place])  3. Screening breast examination -CBE completed and normal.  -Educated and encouraged to initiate SBE with increased breast awareness including examination of breast for skin changes, moles, tenderness, etc.    Routine preventative health maintenance measures emphasized. Please refer to After Visit Summary for other counseling recommendations.   No follow-ups on file.      Cherre Robins, CNM 11/06/2023   .an

## 2023-11-08 ENCOUNTER — Other Ambulatory Visit: Payer: Self-pay | Admitting: Medical Genetics

## 2023-11-08 DIAGNOSIS — Z006 Encounter for examination for normal comparison and control in clinical research program: Secondary | ICD-10-CM

## 2023-11-08 LAB — CYTOLOGY - PAP
Comment: NEGATIVE
Diagnosis: NEGATIVE
Diagnosis: REACTIVE
High risk HPV: NEGATIVE

## 2023-11-26 ENCOUNTER — Telehealth: Payer: Self-pay | Admitting: Family

## 2023-11-26 DIAGNOSIS — K920 Hematemesis: Secondary | ICD-10-CM | POA: Diagnosis not present

## 2023-11-26 DIAGNOSIS — F17210 Nicotine dependence, cigarettes, uncomplicated: Secondary | ICD-10-CM | POA: Diagnosis not present

## 2023-11-26 NOTE — Telephone Encounter (Signed)
Pt stated when she was brushing her teeth this morning she hit the back of her throat which caused her to start vomiting. She stated the vomiting was so forceful she started vomiting blood and wanted to know if she needed an appt. Transferred to triage for nurse eval.

## 2023-11-26 NOTE — Telephone Encounter (Signed)
Noted  

## 2023-11-26 NOTE — Telephone Encounter (Signed)
Initial Comment Caller states that she woke up this morning and forcefully vomited. She has vomited 3 times and there was blood in the vomit. She has urinated in the past 8 hours. She has also had a bowel movement, no blood. Translation No Nurse Assessment Nurse: Charna Elizabeth, RN, Cathy Date/Time (Eastern Time): 11/26/2023 11:50:03 AM Confirm and document reason for call. If symptomatic, describe symptoms. ---Jenna [name] developed vomiting this morning (she has had about 4 episodes so far and she vomited solid blood twice). No severe breathing difficulty. No injury in the past 3 days. Alert and responsive. Does the patient have any new or worsening symptoms? ---Yes Will a triage be completed? ---Yes Related visit to physician within the last 2 weeks? ---No Does the PT have any chronic conditions? (i.e. diabetes, asthma, this includes High risk factors for pregnancy, etc.) ---Yes List chronic conditions. ---SVT, Thyroid Cancer 2021 Is the patient pregnant or possibly pregnant? (Ask all females between the ages of 11-55) ---No Is this a behavioral health or substance abuse call? ---No Guidelines Guideline Title Affirmed Question Affirmed Notes Nurse Date/Time (Eastern Time) Vomiting Blood Feeling weak or lightheaded (e.g., woozy, feeling like they might faint) [place], RN, Lynden Ang 11/26/2023 11:51:58 AM PLEASE NOTE: All timestamps contained within this report are represented as [place] Standard Time. CONFIDENTIALTY NOTICE: This fax transmission is intended only for the addressee. It contains information that is legally privileged, confidential or otherwise protected from use or disclosure. If you are not the intended recipient, you are strictly prohibited from reviewing, disclosing, copying using or disseminating any of this information or taking any action in reliance on or regarding this information. If you have received this fax in error, please notify us immediately by telephone so that  we can arrange for its return to [place]. Phone: [phone], Toll-Free: [phone], Fax: [phone] Page: 2 of 2 Call Id: [id number] Disp. Time [name] Time) Disposition Final User 11/26/2023 11:46:24 AM Send to Urgent Queue [name] 11/26/2023 11:54:24 AM Go to ED Now Yes Charna Elizabeth, RN, Lynden Ang Final Disposition 11/26/2023 11:54:24 AM Go to ED Now Yes Charna Elizabeth, RN, [name] Disagree/Comply Comply Caller Understands Yes PreDisposition Go to ED Care Advice Given Per Guideline GO TO ED NOW: * You need to be seen in the Emergency Department. * Go to the ED at ___________ Hospital. * Leave now. Drive carefully. BRING A BUCKET IN CASE OF VOMITING: * You may wish to bring a bucket, pan, or plastic bag with you in case there is more vomiting during the drive. NOTE TO TRIAGER - DRIVING: * Another adult should drive. * Patient should not delay going to the emergency department. * If immediate transportation is not available via car, rideshare (e.g., Lyft, Uber), or taxi, then the patient should be instructed to call [id number]. BRING MEDICINES: * Bring a list of your current medicines when you go to the Emergency Department (ER). * Bring the pill bottles too. This will help the doctor (or NP/PA) to make certain you are taking the right medicines and the right dose. CARE ADVICE given per Vomiting Blood (Adult) guideline. Referrals [facility] - ED

## 2024-01-04 ENCOUNTER — Ambulatory Visit: Payer: 59 | Admitting: Internal Medicine

## 2024-01-04 NOTE — Progress Notes (Deleted)
Patient ID: Jenna [name], female   DOB: 12/06/1991, 33 y.o.   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  Last visit 1 year ago.  Interim history: At this visit, she feels well, without complaints. She still has occasional palpitations - she is on prn beta blockets. She is busy - in nursing school.  Reviewed history: Her nodule was found at the time of APE with her PCP.  At that time, PCP noted that she had a lump in her neck and sent her for a thyroid ultrasound.  The ultrasound showed a large right thyroid nodule and a complex right smaller nodule (see below).  Thyroid U/S (10/29/2018): Right inferior 2.1 x 1 x 1.3 cm solid, hypoechoic, thyroid nodule      12/03/2018: FNA:  Adequacy Reason Satisfactory For Evaluation. 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   Afirma molecular marker: Benign  12/01/2019: Thyroid U/S: CLINICAL DATA:  Prior ultrasound follow-up. History of right-sided thyroid nodule fine-needle aspiration performed 12/03/2018.  Parenchymal Echotexture: Mildly heterogenous Isthmus: Normal in size measures 0.3 cm in diameter Right lobe: Borderline enlarged measuring 6.8 x 1.4 x 2.0 cm, unchanged, previously, 6.7 x 1.9 x 2.0 cm Left lobe: Normal in size measuring 5.6 x 1.4 x 1.7 cm, unchanged previously, 5.0 x 1.4 x 1.7 cm  _________________________________________________________   The previously biopsied nodule within the inferior pole of the right lobe of the thyroid (labeled 1) is unchanged to slightly increased in size compared to the 10/2018 examination, currently measuring 2.4 x 1.7 x 1.2 cm, previously, 2.1 x 1.3 x 1.0 cm. Correlation  with previous biopsy results is advised.  _________________________________________________________   Nodule # 2: Prior biopsy: No Location: Right; Inferior Maximum size: 1.0 cm; Other 2 dimensions: 0.9 x 0.7 cm, previously, 0.9 x 0.8 x 0.7 cm Composition: solid/almost completely solid (2) Echogenicity: hypoechoic (2) Echogenic foci: punctate echogenic foci (3) ACR TI-RADS total points: 7. Change in features: Yes nodule now appears to contain punctate echogenic foci Change in ACR TI-RADS risk category: Yes previously characterized as a TR4 nodule, currently a TR5 nodule ACR TI-RADS recommendations:   **Given size (>/= 1.0 cm) and appearance, fine needle aspiration of this highly suspicious nodule should be considered based on TI-RADS criteria.   _________________________________________________________   There is an approximately 0.6 x 0.5 x 0.5 cm well-defined hypoechoic nodule within the mid aspect the right lobe of the thyroid which is unchanged in hind site compared to the 11/2018 examination and again does not meet imaging criteria to recommend percutaneous sampling or continued dedicated follow-up.   There is a punctate (approximately 0.2 cm) hypoechoic nodule within the left lobe of the thyroid which is unchanged in hind site compared to the 11/2018 examination and again does not meet imaging criteria to recommend percutaneous sampling or continued dedicated follow-up.   IMPRESSION: 1. Findings suggestive of multinodular goiter. No definitive new worrisome thyroid nodules. 2. Nodule #2 now appears to contain punctate echogenic foci and as such has been up graded from a TR4 nodule to a TR5 nodule and now meets imaging criteria to recommend percutaneous sampling as clinically indicated. 3. Previously biopsied nodule within the inferior pole of the right lobe of the thyroid is unchanged to slightly increased in  size compared to the 10/2018 examination, currently  measuring 2.4 cm, previously, 2.1 cm. Correlation with previous biopsy results is advised. Regardless of slight increase in size, assuming a benign pathologic diagnosis, repeat sampling and/or continued dedicated follow-up is not recommended.  12/02/2019: Thyroid Uptake and scan: Hot nodule at inferior pole of RIGHT thyroid lobe with mild suppression of uptake throughout remaining thyroid tissue. No additional areas of increased or decreased tracer localization.   4 hour I-123 uptake = 14.5% (normal 5-20%)   24 hour I-123 uptake = 22.6% (normal 10-30%)   IMPRESSION: Normal 4 hour and 24 hour radio iodine uptakes.   Hot nodule at inferior pole RIGHT thyroid lobe with mild suppression of uptake of tracer within remaining thyroid tissue consistent with hyperfunctional RIGHT inferior thyroid adenoma.  01/27/2020: FNA of the 1 x 0.9 x 0.7 cm right inferior nodule: FLUS (Bethesda category 3)  Afirma: Suspicious  05/27/2020: Right thyroid lobectomy with isthmusectomy by Dr. Gerrit Friends: FINAL MICROSCOPIC DIAGNOSIS:   A. 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  - No extrathyroidal extension identified.  - Margins of resection are not involved.  - See oncology table.   Procedure: Right hemithyroidectomy  Tumor Focality: Multifocal  Tumor Site: Right lobe  Tumor Size: Greatest dimension: 0.6 cm  Histologic Type: Papillary carcinoma  Margins: Uninvolved by tumor  Angioinvasion: Not identified  Lymphatic Invasion: Not identified  Extrathyroidal Extension: Not identified  Regional Lymph Nodes: No lymph nodes submitted or found  Pathologic Stage Classification (pTNM, AJCC 8th Edition): mpT1a, pNX  Representative Tumor Block: A6  Comment: Dr. Berneice Heinrich reviewed the case.   Neck U/S (01/10/2022): 1. No significant abnormality of the right thyroidectomy bed. 2. No suspicious left thyroid nodules.  Pt  denies: - feeling nodules in neck - hoarseness - dysphagia - choking  She is not on levothyroxine.  Reviewed her TFTs: Lab Results  Component Value Date   TSH 1.58 07/18/2023   TSH 1.81 01/03/2023   TSH 1.95 12/28/2021   TSH 1.094 09/05/2021   TSH 1.46 07/06/2021   TSH 2.21 12/23/2020   TSH 3.78 06/17/2020   TSH 0.39 11/11/2019   TSH 0.43 10/11/2018   TSH 0.49 07/27/2017   FREET4 0.70 07/18/2023   FREET4 0.73 01/03/2023   FREET4 0.80 12/28/2021   FREET4 0.68 07/06/2021   FREET4 0.70 12/23/2020   FREET4 0.70 06/17/2020   FREET4 0.83 11/11/2019    + FH of thyroid ds in mother: multiple thyroid nodules - benign, MGGM - also thyroid nodule. No FH of thyroid cancer. No h/o radiation tx to head or neck. No herbal supplements. No Biotin use. No recent steroids use.  Pt also has a history of SVT (with chronic palpitations)- on beta blocker, heart murmur, HL. She is on Protonix for gastroduodenitis.  ROS: + see HPI  I reviewed pt's medications, allergies, PMH, social hx, family hx, and changes were documented in the history of present illness. Otherwise, unchanged from my initial visit note.  Past Medical History:  Diagnosis Date   Anxiety    Chronic diarrhea    Cyst of ovary 12/2017   Depression    Dysrhythmia    PVCs   GERD (gastroesophageal reflux disease)    Headache    stress related   Heart murmur    since childhood   Hyperlipidemia    Papillary thyroid carcinoma (HCC)    nodules on left lobe still- right side removed  Rapid palpitations 07/08/2015   SVT (supraventricular tachycardia) (HCC)    last episode- 2 years ago   Vasovagal syncope 2011   no since then   Past Surgical History:  Procedure Laterality Date   THYROID LOBECTOMY Right 05/27/2020   Procedure: RIGHT THYROID LOBECTOMY;  Surgeon: Darnell Level, MD;  Location: [facility];  Service: General;  Laterality: Right;   WISDOM TOOTH EXTRACTION     Social History   Socioeconomic History   Marital  status: Single    Spouse name: Not on file   Number of children: 0   Years of education: Not on file   Highest education level: Not on file  Occupational History    Games developer  Social Needs   Financial resource strain: Not on file   Food insecurity:    Worry: Not on file    Inability: Not on file   Transportation needs:    Medical: Not on file    Non-medical: Not on file  Tobacco Use   Smoking status: Current Every Day Smoker    Packs/day: 1.00    Types: Cigarettes   Smokeless tobacco: Never Used   Tobacco comment: 7 cigarettes daily  Substance and Sexual Activity   Alcohol use: Yes    Alcohol/week:     Types:     Comment: Rarely, beer, 3 drinks   Drug use: No  Lifestyle   Physical activity:    Days per week: Not on file    Minutes per session: Not on file   Stress: Not on file  Social History Narrative   Lives in [place] with her mother, Niti Kohtz, and her brother, Louie Boston.  Identifies as [place]. Endorses tobacco use as well as rare THC use. Denies ETOH use. + sexually active. Has implanon.     GED   Installs pools   Current Outpatient Medications on File Prior to Visit  Medication Sig Dispense Refill   norgestimate-ethinyl estradiol (SPRINTEC 28) 0.25-35 MG-MCG tablet Take 1 tablet by mouth daily. 84 tablet 3   rosuvastatin (CRESTOR) 5 MG tablet Take 1 tablet (5 mg total) by mouth daily. (Patient not taking: Reported on 11/06/2023) 30 tablet 3   valACYclovir (VALTREX) 1000 MG tablet Take 2 tabs by mouth now and again in 12 hrs 4 tablet 3   Varenicline Tartrate, Starter, (CHANTIX STARTING MONTH PAK) 0.5 MG X 11 & 1 MG X 42 TBPK Take one 0.5 mg tablet by mouth once daily for 3 days, then increase to one 0.5 mg tablet twice daily for 4 days, then increase to one 1 mg tablet twice daily. (Patient not taking: Reported on 11/06/2023) 53 each 0   No current facility-administered medications on file prior to visit.   Allergies  Allergen  Reactions   Adhesive [Tape] Itching and Rash    Blistering of skin     Family History  Problem Relation Age of Onset   Arrhythmia Maternal Grandmother    Hypertension Maternal Grandfather    Diabetes Maternal Grandfather    Heart attack Maternal Grandfather    Heart disease Maternal Grandfather    Heart murmur Mother    Melanoma Mother        stage 3 malignant melanoma   Healthy Father    Healthy Brother    Scoliosis Brother    Other Maternal Aunt        TACHYCARDIA   Diabetes Paternal Grandfather    Arrhythmia Other        Aunt  w/ tachyarrythmia at age 15   Cancer Other    Stomach cancer Other    Colon cancer Neg Hx    Esophageal cancer Neg Hx    Rectal cancer Neg Hx    PE: There were no vitals taken for this visit. Wt Readings from Last 3 Encounters:  11/06/23 173 lb (78.5 kg)  07/17/23 174 lb (78.9 kg)  01/03/23 179 lb 3.2 oz (81.3 kg)   Constitutional: Slightly overweight, in NAD Eyes: EOMI, no exophthalmos ENT: no neck masses palpated, thyroidectomy scar without keloid, inconspicuous, no cervical lymphadenopathy Cardiovascular: RRR, No MRG Respiratory: CTA B Musculoskeletal: no deformities Skin:  no rashes Neurological: no tremor with outstretched hands  ASSESSMENT: 1.  Papillary thyroid cancer -Multifocal, subcentimeter  2. S/p right thyroid lobectomy  3.  History of subclinical hyperthyroidism -Resolved after thyroid surgery  PLAN: 1.  Papillary thyroid cancer -Patient with history of thyroid nodules, of which the dominant nodule was fairly large, hypoechoic, without other worrisome characteristics.  The nodule appears to be flat on the thyroid uptake and scan from 2020.  However, on the ultrasound from 11/2019 she had a small, right inferior thyroid nodule measuring 1 cm in the largest dimension which appears to be solid, hypoechoic, and with microcalcifications.  We biopsied this nodule and the results were inconclusive in 01/2020, however, the Afirma  molecular marker returned suspicious.  Therefore, I referred her to surgery and she had hemithyroidectomy.  Final pathology showed papillary thyroid cancer, multifocal, with the largest focus of 0.6 cm but without lymphovascular extension or other worrisome characteristics.  Therefore, she has low risk, stage I, thyroid cancer.  The cancer was very small so I did not suggest completion thyroidectomy and RAI treatment.  We are following her clinically and with neck ultrasounds -Latest ultrasound is from 12/2021 and this did not show any suspicious masses. -She has no neck compression symptoms or masses felt on palpation of her neck today -Thyroglobulin measurements are not indicated in follow-up for patients undergoing hemithyroidectomy, without RAI treatment, due to fluctuating levels -At today's visit, we will recheck her TFTs and will also check a new ultrasound -I will see her back in 1 year  2.  S/p right lobectomy + isthmusectomy -She previously had keloid at the surgical scar site, but this healed with Kelo-Cote strips -She has no dysesthesia or pain at the surgical scar -Will recheck her TFTs today to see if she needs levothyroxine supplementation -We discussed that approximately 25% of the patients may require levothyroxine after thyroidectomy but the incidence decreases over time   Carlus Pavlov, MD PhD [facility]

## 2024-01-09 NOTE — Progress Notes (Addendum)
Patient ID: Jenna [name], female   DOB: [birth year], 33 y.o.   MRN: [id number]  This note was precharted 01/04/2024.  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  Last visit 1 year ago.  Interim history: At today's visit she feels well, without complaints. She previously had occasional palpitations, on prn beta blockets. She was started on Crestor low-dose over the summer when an LDL returned elevated, at 478.  He does have a family history of hyperlipidemia. She is busy - in nursing school.  Reviewed history: Her nodule was found at the time of APE with her PCP.  At that time, PCP noted that she had a lump in her neck and sent her for a thyroid ultrasound.  The ultrasound showed a large right thyroid nodule and a complex right smaller nodule (see below).  Thyroid U/S (10/29/2018): Right inferior 2.1 x 1 x 1.3 cm solid, hypoechoic, thyroid nodule      12/03/2018: FNA:  Adequacy Reason Satisfactory For Evaluation. 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   Afirma molecular marker: Benign  12/01/2019: Thyroid U/S: CLINICAL DATA:  Prior ultrasound follow-up. History of right-sided thyroid nodule fine-needle aspiration performed 12/03/2018.  Parenchymal Echotexture: Mildly heterogenous Isthmus: Normal in size measures 0.3 cm in diameter Right lobe: Borderline enlarged measuring 6.8 x 1.4 x 2.0 cm, unchanged, previously, 6.7 x 1.9 x 2.0 cm Left lobe: Normal in size measuring 5.6 x 1.4 x 1.7 cm, unchanged previously, 5.0 x 1.4 x 1.7 cm  _________________________________________________________   The previously biopsied nodule within the inferior pole of the right lobe of the thyroid (labeled 1)  is unchanged to slightly increased in size compared to the 10/2018 examination, currently measuring 2.4 x 1.7 x 1.2 cm, previously, 2.1 x 1.3 x 1.0 cm. Correlation with previous biopsy results is advised.  _________________________________________________________   Nodule # 2: Prior biopsy: No Location: Right; Inferior Maximum size: 1.0 cm; Other 2 dimensions: 0.9 x 0.7 cm, previously, 0.9 x 0.8 x 0.7 cm Composition: solid/almost completely solid (2) Echogenicity: hypoechoic (2) Echogenic foci: punctate echogenic foci (3) ACR TI-RADS total points: 7. Change in features: Yes nodule now appears to contain punctate echogenic foci Change in ACR TI-RADS risk category: Yes previously characterized as a TR4 nodule, currently a TR5 nodule ACR TI-RADS recommendations:   **Given size (>/= 1.0 cm) and appearance, fine needle aspiration of this highly suspicious nodule should be considered based on TI-RADS criteria.   _________________________________________________________   There is an approximately 0.6 x 0.5 x 0.5 cm well-defined hypoechoic nodule within the mid aspect the right lobe of the thyroid which is unchanged in hind site compared to the 11/2018 examination and again does not meet imaging criteria to recommend percutaneous sampling or continued dedicated follow-up.   There is a punctate (approximately 0.2 cm) hypoechoic nodule within the left lobe of the thyroid which is unchanged in hind site compared to the 11/2018 examination and again does not meet imaging criteria to recommend percutaneous sampling or continued dedicated follow-up.   IMPRESSION: 1. Findings suggestive of multinodular goiter. No definitive new worrisome thyroid nodules. 2. Nodule #2 now appears to contain punctate echogenic foci and as such has been up graded from a TR4 nodule to a TR5 nodule and now meets imaging criteria to  recommend percutaneous sampling as clinically indicated. 3. Previously  biopsied nodule within the inferior pole of the right lobe of the thyroid is unchanged to slightly increased in size compared to the 10/2018 examination, currently measuring 2.4 cm, previously, 2.1 cm. Correlation with previous biopsy results is advised. Regardless of slight increase in size, assuming a benign pathologic diagnosis, repeat sampling and/or continued dedicated follow-up is not recommended.  12/02/2019: Thyroid Uptake and scan: Hot nodule at inferior pole of RIGHT thyroid lobe with mild suppression of uptake throughout remaining thyroid tissue. No additional areas of increased or decreased tracer localization.   4 hour I-123 uptake = 14.5% (normal 5-20%)   24 hour I-123 uptake = 22.6% (normal 10-30%)   IMPRESSION: Normal 4 hour and 24 hour radio iodine uptakes.   Hot nodule at inferior pole RIGHT thyroid lobe with mild suppression of uptake of tracer within remaining thyroid tissue consistent with hyperfunctional RIGHT inferior thyroid adenoma.  01/27/2020: FNA of the 1 x 0.9 x 0.7 cm right inferior nodule: FLUS (Bethesda category 3)  Afirma: Suspicious  05/27/2020: Right thyroid lobectomy with isthmusectomy by Dr. Gerrit Friends: FINAL MICROSCOPIC DIAGNOSIS:   A. 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  - No extrathyroidal extension identified.  - Margins of resection are not involved.  - See oncology table.   Procedure: Right hemithyroidectomy  Tumor Focality: Multifocal  Tumor Site: Right lobe  Tumor Size: Greatest dimension: 0.6 cm  Histologic Type: Papillary carcinoma  Margins: Uninvolved by tumor  Angioinvasion: Not identified  Lymphatic Invasion: Not identified  Extrathyroidal Extension: Not identified  Regional Lymph Nodes: No lymph nodes submitted or found  Pathologic Stage Classification (pTNM, AJCC 8th Edition): mpT1a, pNX  Representative Tumor Block: A6  Comment: Dr.  Berneice Heinrich reviewed the case.   Neck U/S (01/10/2022): 1. No significant abnormality of the right thyroidectomy bed. 2. No suspicious left thyroid nodules.  Pt denies: - feeling nodules in neck - hoarseness - dysphagia - choking  She is not on levothyroxine.  Reviewed her TFTs: Lab Results  Component Value Date   TSH 1.58 07/18/2023   TSH 1.81 01/03/2023   TSH 1.95 12/28/2021   TSH 1.094 09/05/2021   TSH 1.46 07/06/2021   TSH 2.21 12/23/2020   TSH 3.78 06/17/2020   TSH 0.39 11/11/2019   TSH 0.43 10/11/2018   TSH 0.49 07/27/2017   FREET4 0.70 07/18/2023   FREET4 0.73 01/03/2023   FREET4 0.80 12/28/2021   FREET4 0.68 07/06/2021   FREET4 0.70 12/23/2020   FREET4 0.70 06/17/2020   FREET4 0.83 11/11/2019    + FH of thyroid ds in mother: multiple thyroid nodules - benign, MGGM - also thyroid nodule. No FH of thyroid cancer. No h/o radiation tx to head or neck. No herbal supplements. No Biotin use. No recent steroids use.  Pt also has a history of SVT (with chronic palpitations), heart murmur, HL.   ROS: + see HPI  I reviewed pt's medications, allergies, PMH, social hx, family hx, and changes were documented in the history of present illness. Otherwise, unchanged from my initial visit note.  Past Medical History:  Diagnosis Date   Anxiety    Chronic diarrhea    Cyst of ovary 12/2017   Depression    Dysrhythmia    PVCs   GERD (gastroesophageal reflux disease)    Headache    stress related   Heart murmur    since childhood   Hyperlipidemia  Papillary thyroid carcinoma (HCC)    nodules on left lobe still- right side removed   Rapid palpitations 07/08/2015   SVT (supraventricular tachycardia) (HCC)    last episode- 2 years ago   Vasovagal syncope 2011   no since then   Past Surgical History:  Procedure Laterality Date   THYROID LOBECTOMY Right 05/27/2020   Procedure: RIGHT THYROID LOBECTOMY;  Surgeon: Darnell Level, MD;  Location: [facility];  Service: General;   Laterality: Right;   WISDOM TOOTH EXTRACTION     Social History   Socioeconomic History   Marital status: Single    Spouse name: Not on file   Number of children: 0   Years of education: Not on file   Highest education level: Not on file  Occupational History    Games developer  Social Needs   Financial resource strain: Not on file   Food insecurity:    Worry: Not on file    Inability: Not on file   Transportation needs:    Medical: Not on file    Non-medical: Not on file  Tobacco Use   Smoking status: Current Every Day Smoker    Packs/day: 1.00    Types: Cigarettes   Smokeless tobacco: Never Used   Tobacco comment: 7 cigarettes daily  Substance and Sexual Activity   Alcohol use: Yes    Alcohol/week:     Types:     Comment: Rarely, beer, 3 drinks   Drug use: No  Lifestyle   Physical activity:    Days per week: Not on file    Minutes per session: Not on file   Stress: Not on file  Social History Narrative   Lives in [place] with her mother, Santanna Whitford, and her brother, Louie Boston.  Identifies as [place]. Endorses tobacco use as well as rare THC use. Denies ETOH use. + sexually active. Has implanon.     GED   Installs pools   Current Outpatient Medications on File Prior to Visit  Medication Sig Dispense Refill   norgestimate-ethinyl estradiol (SPRINTEC 28) 0.25-35 MG-MCG tablet Take 1 tablet by mouth daily. 84 tablet 3   rosuvastatin (CRESTOR) 5 MG tablet Take 1 tablet (5 mg total) by mouth daily. (Patient not taking: Reported on 11/06/2023) 30 tablet 3   valACYclovir (VALTREX) 1000 MG tablet Take 2 tabs by mouth now and again in 12 hrs 4 tablet 3   Varenicline Tartrate, Starter, (CHANTIX STARTING MONTH PAK) 0.5 MG X 11 & 1 MG X 42 TBPK Take one 0.5 mg tablet by mouth once daily for 3 days, then increase to one 0.5 mg tablet twice daily for 4 days, then increase to one 1 mg tablet twice daily. (Patient not taking: Reported on 11/06/2023) 53 each 0    No current facility-administered medications on file prior to visit.   Allergies  Allergen Reactions   Adhesive [Tape] Itching and Rash    Blistering of skin     Family History  Problem Relation Age of Onset   Arrhythmia Maternal Grandmother    Hypertension Maternal Grandfather    Diabetes Maternal Grandfather    Heart attack Maternal Grandfather    Heart disease Maternal Grandfather    Heart murmur Mother    Melanoma Mother        stage 3 malignant melanoma   Healthy Father    Healthy Brother    Scoliosis Brother    Other Maternal Aunt        TACHYCARDIA  Diabetes Paternal Grandfather    Arrhythmia Other        Aunt w/ tachyarrythmia at age 59   Cancer Other    Stomach cancer Other    Colon cancer Neg Hx    Esophageal cancer Neg Hx    Rectal cancer Neg Hx    PE: BP 118/60   Pulse 83   Ht 5\' 4"  (1.626 m)   Wt 162 lb 12.8 oz (73.8 kg)   SpO2 99%   BMI 27.94 kg/m  Wt Readings from Last 10 Encounters:  01/10/24 162 lb 12.8 oz (73.8 kg)  11/06/23 173 lb (78.5 kg)  07/17/23 174 lb (78.9 kg)  01/03/23 179 lb 3.2 oz (81.3 kg)  09/26/22 175 lb (79.4 kg)  09/08/22 174 lb (78.9 kg)  12/28/21 171 lb 6.4 oz (77.7 kg)  09/05/21 170 lb (77.1 kg)  07/11/21 172 lb (78 kg)  01/24/21 169 lb 8 oz (76.9 kg)   Constitutional: normal weight, in NAD Eyes: EOMI, no exophthalmos ENT: no neck masses palpated, thyroidectomy scar without keloid, inconspicuous, no cervical lymphadenopathy Cardiovascular: RRR, No MRG Respiratory: CTA B Musculoskeletal: no deformities Skin:  no rashes Neurological: no tremor with outstretched hands  ASSESSMENT: 1.  Papillary thyroid cancer -Multifocal, subcentimeter  2. S/p right thyroid lobectomy  3.  History of subclinical hyperthyroidism -Resolved after thyroid surgery  PLAN: 1.  Papillary thyroid cancer -Patient with history of thyroid nodules, of which the dominant nodule was fairly large, hypoechoic, without other worrisome  characteristics.  The nodule appears to be flat on the thyroid uptake and scan from 2020.  However, on the ultrasound from 11/2019 she had a small, right inferior thyroid nodule measuring 1 cm in the largest dimension which appears to be solid, hypoechoic, and with microcalcifications.  We biopsied this nodule and the results were inconclusive in 01/2020, however, the Afirma molecular marker returned suspicious.  Therefore, I referred her to surgery and she had hemithyroidectomy.  Final pathology showed papillary thyroid cancer, multifocal, with the largest focus of 0.6 cm but without lymphovascular extension or other worrisome characteristics.  Therefore, she has low risk, stage I, thyroid cancer.  The cancer was very small so I did not suggest completion thyroidectomy and RAI treatment.  We are following her clinically and with neck ultrasounds -Latest ultrasound is from 12/2021 and it did not show any suspicious masses -She has no neck compression symptoms or masses felt on palpation of her neck today -Thyroglobulin measurements are not indicated in follow-up for patients undergoing hemithyroidectomy, without RAI treatment, due to fluctuating levels -At today's visit, we will recheck her TFTs and also a new ultrasound -I will see her back in 1 year  2.  S/p right lobectomy + isthmusectomy -She previously had keloid at the surgical scar site, but this healed with Kelo-Cote strips -No dysesthesia or pain at the surgical scar -Will check her TFTs today to see if she needs levothyroxine supplementation.  She did lose almost 17 pounds since last visit, which may help with thyroid hormone requirement -We discussed that approximately 25% of the patients may require levothyroxine after thyroidectomy but the incidence decreases over time.  I still encouraged her to let me know if she starts developing hypothyroid symptoms (weight gain, increased fatigue, cold intolerance, constipation, hair loss)  even if they  happen between appointments.  Orders Placed This Encounter  Procedures   US THYROID   TSH   T4, free   T3, free   Component  Latest Ref Rng 01/10/2024  T4,Free(Direct)     0.8 - 1.8 ng/dL 1.2   Triiodothyronine,Free,Serum     2.3 - 4.2 pg/mL 3.3   TSH     mIU/L 0.77   Thyroid tests are at goal.  No need for levothyroxine.  Neck U/S (01/11/2024): Parenchymal Echotexture: Normal  Isthmus: Surgically absent  Right lobe: Surgically absent  Left lobe: 5.8 x 1.7 x 1.8 cm  _________________________________________________________   Estimated total number of nodules >/= 1 cm: 0 _________________________________________________________   Patient is status post right hemithyroidectomy. No right thyroid bed abnormality, nodule, mass or residual thyroid tissue. Left thyroid redemonstrates a 9 mm mixed cystic/solid nodule, unchanged. No follow-up or biopsy recommended. No new thyroid abnormality. No regional adenopathy.   IMPRESSION: 1. Status post right hemithyroidectomy. 2. Stable 9 mm left thyroid nodule. 3. No new or acute finding by ultrasound.   Carlus Pavlov, MD PhD [facility]

## 2024-01-10 ENCOUNTER — Ambulatory Visit (INDEPENDENT_AMBULATORY_CARE_PROVIDER_SITE_OTHER): Payer: 59 | Admitting: Internal Medicine

## 2024-01-10 ENCOUNTER — Encounter: Payer: Self-pay | Admitting: Internal Medicine

## 2024-01-10 VITALS — BP 118/60 | HR 83 | Ht 64.0 in | Wt 162.8 lb

## 2024-01-10 DIAGNOSIS — C73 Malignant neoplasm of thyroid gland: Secondary | ICD-10-CM

## 2024-01-10 DIAGNOSIS — Z9889 Other specified postprocedural states: Secondary | ICD-10-CM | POA: Diagnosis not present

## 2024-01-10 NOTE — Patient Instructions (Signed)
Please stop at the lab.  Please come back for a follow-up appointment in 1 year.  

## 2024-01-11 ENCOUNTER — Ambulatory Visit
Admission: RE | Admit: 2024-01-11 | Discharge: 2024-01-11 | Disposition: A | Discharge status: Home / Self Care | Payer: Medicaid Other | Source: Ambulatory Visit | Attending: Internal Medicine | Admitting: Internal Medicine

## 2024-01-11 ENCOUNTER — Encounter: Payer: Self-pay | Admitting: Internal Medicine

## 2024-01-11 DIAGNOSIS — Z9889 Other specified postprocedural states: Secondary | ICD-10-CM

## 2024-01-11 LAB — T4, FREE: Free T4: 1.2 ng/dL (ref 0.8–1.8)

## 2024-01-11 LAB — TSH: TSH: 0.77 m[IU]/L

## 2024-01-11 LAB — T3, FREE: T3, Free: 3.3 pg/mL (ref 2.3–4.2)

## 2024-02-11 ENCOUNTER — Telehealth: Payer: Self-pay | Admitting: Neurology

## 2024-02-11 NOTE — Telephone Encounter (Signed)
 Copied from CRM [id number]. Topic: Clinical - Request for Lab/Test Order >> Feb 11, 2024 10:42 AM Corin V wrote: Reason for CRM: Patient is needing an order to get a  TB blood test. 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 Please call her to schedule

## 2024-02-12 ENCOUNTER — Other Ambulatory Visit: Payer: Self-pay

## 2024-02-12 ENCOUNTER — Other Ambulatory Visit (INDEPENDENT_AMBULATORY_CARE_PROVIDER_SITE_OTHER): Payer: 59

## 2024-02-12 DIAGNOSIS — Z111 Encounter for screening for respiratory tuberculosis: Secondary | ICD-10-CM

## 2024-02-12 NOTE — Telephone Encounter (Signed)
 Patient scheduled to come in today for testing

## 2024-02-15 ENCOUNTER — Encounter: Payer: Self-pay | Admitting: Family

## 2024-02-15 LAB — QUANTIFERON-TB GOLD PLUS
Mitogen-NIL: >10 [IU]/mL
NIL: 0.03 [IU]/mL
QuantiFERON-TB Gold Plus: NEGATIVE
TB1-NIL: 0.01 [IU]/mL
TB2-NIL: 0 [IU]/mL

## 2024-04-18 ENCOUNTER — Ambulatory Visit (INDEPENDENT_AMBULATORY_CARE_PROVIDER_SITE_OTHER): Admitting: Physician Assistant

## 2024-04-18 VITALS — BP 116/80 | HR 84 | Temp 98.4°F | Resp 16 | Ht 64.0 in | Wt 168.8 lb

## 2024-04-18 DIAGNOSIS — H66002 Acute suppurative otitis media without spontaneous rupture of ear drum, left ear: Secondary | ICD-10-CM

## 2024-04-18 DIAGNOSIS — J0100 Acute maxillary sinusitis, unspecified: Secondary | ICD-10-CM | POA: Diagnosis not present

## 2024-04-18 MED ORDER — AMOXICILLIN-POT CLAVULANATE 875-125 MG PO TABS: 1.0000 | ORAL_TABLET | Freq: Two times a day (BID) | ORAL | 0 refills | Status: AC

## 2024-04-18 MED ORDER — PREDNISONE 20 MG PO TABS: 20.0000 mg | ORAL_TABLET | Freq: Every day | ORAL | 0 refills | Status: DC

## 2024-04-18 NOTE — Progress Notes (Signed)
 Established patient visit   Patient: Jenna [name]   DOB: 06-28-1991   33 y.o. Female  MRN: [id number] Visit Date: 04/18/2024  Today's healthcare provider: Trenton Frock, PA-C   Chief Complaint  Patient presents with   Nasal Congestion    Sxs started Saturday, scratchy throat, congestion, productive cough, negative flu/covid x2. Using liquid mucinex    Subjective     Pt reports sore throat, congestion, productive cough x 5-6 days. Taking mucinex, alka seltzer otc.   Took flu/covid tests this week, negative.   Medications: Outpatient Medications Prior to Visit  Medication Sig   norgestimate -ethinyl estradiol  (SPRINTEC 28) 0.25-35 MG-MCG tablet Take 1 tablet by mouth daily.   rosuvastatin  (CRESTOR ) 5 MG tablet Take 1 tablet (5 mg total) by mouth daily.   valACYclovir  (VALTREX ) 1000 MG tablet Take 2 tabs by mouth now and again in 12 hrs   Varenicline  Tartrate, Starter, (CHANTIX  STARTING MONTH PAK) 0.5 MG X 11 & 1 MG X 42 TBPK Take one 0.5 mg tablet by mouth once daily for 3 days, then increase to one 0.5 mg tablet twice daily for 4 days, then increase to one 1 mg tablet twice daily. (Patient not taking: Reported on 04/18/2024)   No facility-administered medications prior to visit.    Review of Systems  Constitutional:  Positive for fatigue. Negative for fever.  HENT:  Positive for congestion, ear pain, postnasal drip, sinus pressure and sore throat.   Respiratory:  Positive for cough. Negative for shortness of breath.   Cardiovascular:  Negative for chest pain and leg swelling.  Gastrointestinal:  Negative for abdominal pain.  Neurological:  Negative for dizziness and headaches.       Objective    BP 116/80 (BP Location: Left Arm, Patient Position: Sitting, Cuff Size: Normal)   Pulse 84   Temp 98.4 F (36.9 C) (Oral)   Resp 16   Ht 5\' 4"  (1.626 m)   Wt 168 lb 12.8 oz (76.6 kg)   SpO2 99%   BMI 28.97 kg/m    Physical Exam Constitutional:      General: She  is awake.     Appearance: She is well-developed.  HENT:     Head: Normocephalic.     Ears:     Comments: Left TM bulging and erythematous. Right TM bulging    Nose: Congestion present.     Mouth/Throat:     Pharynx: Posterior oropharyngeal erythema present. No oropharyngeal exudate.  Eyes:     Conjunctiva/sclera: Conjunctivae normal.  Cardiovascular:     Rate and Rhythm: Normal rate.  Pulmonary:     Effort: Pulmonary effort is normal.     Breath sounds: Normal breath sounds. No wheezing, rhonchi or rales.  Skin:    General: Skin is warm.  Neurological:     Mental Status: She is alert and oriented to person, place, and time.  Psychiatric:        Attention and Perception: Attention normal.        Mood and Affect: Mood normal.        Speech: Speech normal.        Behavior: Behavior is cooperative.     No results found for any visits on 04/18/24.  Assessment & Plan    Non-recurrent acute suppurative otitis media of left ear without spontaneous rupture of tympanic membrane -     Amoxicillin -Pot Clavulanate; Take 1 tablet by mouth 2 (two) times daily for 7 days.  Dispense: 14 tablet;  Refill: 0  Acute non-recurrent maxillary sinusitis -     predniSONE; Take 1 tablet (20 mg total) by mouth daily with breakfast.  Dispense: 5 tablet; Refill: 0   Recommending pt start with otc antihistamines, flonase , saline rinses and prednisone 20 mg x 5 days. If symptoms persist ok to take augmentin  bid x 7 days   Return if symptoms worsen or fail to improve.       Trenton Frock, PA-C  [facility] [phone] (phone) [phone] (fax)  [facility]

## 2024-10-15 ENCOUNTER — Other Ambulatory Visit: Payer: Self-pay | Admitting: Medical Genetics

## 2024-10-15 DIAGNOSIS — Z006 Encounter for examination for normal comparison and control in clinical research program: Secondary | ICD-10-CM

## 2024-10-26 ENCOUNTER — Telehealth: Admitting: Physician Assistant

## 2024-10-26 DIAGNOSIS — B001 Herpesviral vesicular dermatitis: Secondary | ICD-10-CM

## 2024-10-26 MED ORDER — VALACYCLOVIR HCL 1 G PO TABS: 2000.0000 mg | ORAL_TABLET | Freq: Two times a day (BID) | ORAL | 0 refills | Status: AC

## 2024-10-26 NOTE — Progress Notes (Signed)
 We are sorry that you are not feeling well.  Here is how we plan to help!  Based on what you have shared with me it does look like you have a viral infection.    Most cold sores or fever blisters are small fluid filled blisters around the mouth caused by herpes simplex virus.  The most common strain of the virus causing cold sores is herpes simplex virus 1.  It can be spread by skin contact, sharing eating utensils, or even sharing towels.  Cold sores are contagious to other people until dry. (Approximately 5-7 days).  Wash your hands. You can spread the virus to your eyes through handling your contact lenses after touching the lesions.  Most people experience pain at the sight or tingling sensations in their lips that may begin before the ulcers erupt.  Herpes simplex is treatable but not curable.  It may lie dormant for a long time and then reappear due to stress or prolonged sun exposure.  Many patients have success in treating their cold sores with an over the counter topical called Abreva.  You may apply the cream up to 5 times daily (maximum 10 days) until healing occurs.  If you would like to use an oral antiviral medication to speed the healing of your cold sore, I have sent a prescription to your local pharmacy Valacyclovir  2 gm take one by mouth twice a day for 1 day    HOME CARE:  Wash your hands frequently. Do not pick at or rub the sore. Don't open the blisters. Avoid kissing other people during this time. Avoid sharing drinking glasses, eating utensils, or razors. Do not handle contact lenses unless you have thoroughly washed your hands with soap and warm water ! Avoid oral sex during this time.  Herpes from sores on your mouth can spread to your partner's genital area. Avoid contact with anyone who has eczema or a weakened immune system. Cold sores are often triggered by exposure to intense sunlight, use a lip balm containing a sunscreen (SPF 30 or higher).  GET HELP RIGHT AWAY  IF:  Blisters look infected. Blisters occur near or in the eye. Symptoms last longer than 10 days. Your symptoms become worse.  MAKE SURE YOU:  Understand these instructions. Will watch your condition. Will get help right away if you are not doing well or get worse.    Your e-visit answers were reviewed by a board certified advanced clinical practitioner to complete your personal care plan.  Depending upon the condition, your plan could have  Included both over the counter or prescription medications.    Please review your pharmacy choice.  Be sure that the pharmacy you have chosen is open so that you can pick up your prescription now.  If there is a problem you can message your provider in MyChart to have the prescription routed to another pharmacy.    Your safety is important to us .  If you have drug allergies check our prescription carefully.  For the next 24 hours you can use MyChart to ask questions about today's visit, request a non-urgent call back, or ask for a work or school excuse from your e-visit provider.  You will get an email in the next two days asking about your experience.  I hope that your e-visit has been valuable and will speed your recovery.   I have spent 5 minutes in review of e-visit questionnaire, review and updating patient chart, medical decision making and response to patient.  Teena Shuck, PA-C

## 2024-11-27 ENCOUNTER — Ambulatory Visit: Admitting: Student

## 2024-11-27 VITALS — BP 107/68 | HR 86 | Temp 98.3°F | Ht 64.0 in | Wt 166.0 lb

## 2024-11-27 DIAGNOSIS — E7800 Pure hypercholesterolemia, unspecified: Secondary | ICD-10-CM

## 2024-11-27 DIAGNOSIS — J029 Acute pharyngitis, unspecified: Secondary | ICD-10-CM | POA: Diagnosis not present

## 2024-11-27 LAB — POCT INFLUENZA A/B
Influenza A, POC: NEGATIVE
Influenza B, POC: NEGATIVE

## 2024-11-27 LAB — POC COVID19 BINAXNOW: SARS Coronavirus 2 Ag: NEGATIVE

## 2024-11-27 LAB — POCT RAPID STREP A (OFFICE): Rapid Strep A Screen: NEGATIVE

## 2024-11-27 NOTE — Progress Notes (Signed)
 No chief complaint on file.   Jenna [name] here for URI complaints.   MondayHistory of Present Illness Jenna [name] is a 33 year old female who presents with sore throat and muscle aches.  Symptoms began Monday with a scratchy, throat, which progressed to a sore throat by Wednesday. She has used throat lozenges and hot tea with temporary relief. On Thursday she developed nausea and a sensation of drainage in her throat. She denies nasal congestion.  She has headache and myalgias in her legs and one arm, described as post-exertional soreness despite no recent exercise. Denies fever, CP, SOB. She denies sinus pain, sinus pressure, congestion. She reports a cough she attributes to throat irritation or post-nasal drip.  She works as a psychologist, sport and exercise and is worried about returning to work this weekend and potentially spreading an illness. She is not aware of any sick contacts.  Duration: 3 days  Associated symptoms: sore throat Denies: subjective fever, sinus congestion, sinus pain, ear fullness, ear pain, ear drainage, and wheezing Treatment to date: no Sick contacts: No   Patient denies fever, chills, SOB, CP, palpitations, dyspnea, edema, HA, vision changes, N/V/D, abdominal pain, urinary symptoms, rash.   BP Readings from Last 3 Encounters:  11/27/24 (!) 148/83  04/18/24 116/80  01/10/24 118/60     Past Medical History:  Diagnosis Date   Anxiety    Chronic diarrhea    Cyst of ovary 12/2017   Depression    Dysrhythmia    PVCs   GERD (gastroesophageal reflux disease)    Headache    stress related   Heart murmur    since childhood   Hyperlipidemia    Papillary thyroid  carcinoma (HCC)    nodules on left lobe still- right side removed   Rapid palpitations 07/08/2015   SVT (supraventricular tachycardia)    last episode- 2 years ago   Vasovagal syncope 2011   no since then    Objective BP (!) 148/83   Pulse 86   Temp 98.3 F (36.8 C)   Ht 5' 4 (1.626 m)   Wt 166 lb  (75.3 kg)   SpO2 98%   BMI 28.49 kg/m  General: Awake, alert, appears stated age HEENT: AT, [place], ears patent b/l and TM's neg, nares patent w/o discharge, pharynx with mild erythma and without exudates, MMM Neck: No masses or asymmetry Heart: RRR Lungs: CTAB, no accessory muscle use Psych: Age appropriate judgment and insight, normal mood and affect  Acute viral pharyngitis  Sore throat - Plan: POCT Influenza A/B, POCT rapid strep A, POC COVID-19 BinaxNow  High cholesterol - Plan: Lipid panel   Acute pharyngitis Likely viral etiology. Negative FLU, Covid, Strep. - Continue symptomatic management - Encourage hydration. - Use chloraseptic spray, lozenges,  hot tea with honey for throat discomfort. - Monitor for worsening symptoms   Elevated blood pressure Recent readings of 148/83. Possible white coat hypertension r/t viral illness. - Monitor blood pressure at home, RTC is > 140/90. - Advised on dietary salt reduction/ limit caffeine - Recheck blood pressure before next visit.  Continue to push fluids, practice good hand hygiene, cover mouth when coughing. F/u prn. If starting to experience fevers, shaking, or shortness of breath, seek immediate care. Pt voiced understanding and agreement to the plan.  Harlene LITTIE Jolly, DNP, AGNP-C 11/27/2024 2:51 PM

## 2025-01-09 ENCOUNTER — Encounter: Payer: Self-pay | Admitting: Internal Medicine

## 2025-01-09 ENCOUNTER — Ambulatory Visit: Payer: 59 | Admitting: Internal Medicine

## 2025-01-09 ENCOUNTER — Other Ambulatory Visit

## 2025-01-09 VITALS — BP 120/60 | HR 89 | Ht 64.0 in | Wt 168.2 lb

## 2025-01-09 DIAGNOSIS — Z9889 Other specified postprocedural states: Secondary | ICD-10-CM | POA: Diagnosis not present

## 2025-01-09 DIAGNOSIS — C73 Malignant neoplasm of thyroid gland: Secondary | ICD-10-CM | POA: Diagnosis not present

## 2025-01-09 NOTE — Patient Instructions (Signed)
 Please stop at the lab.  Please come back for a follow-up appointment in 1 year.

## 2025-01-09 NOTE — Progress Notes (Signed)
 Patient ID: Jenna [name], female   DOB: 1991/11/10, 34 y.o.   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  Last visit 1 year ago.  Interim history: At today's visit she feels well but does mention increased fatigue.  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  She plans to take the summer off. She has occasional palpitations, prev. on prn beta blockers, not recently. She was started on Crestor  low-dose when a direct LDL returned elevated, at 812.  He does have a family history of hyperlipidemia. She is not taking the Crestor  now after she ran out.   Reviewed history: Her nodule was found at the time of APE with her PCP.  At that time, PCP noted that she had a lump in her neck and sent her for a thyroid  ultrasound.  The ultrasound showed a large right thyroid  nodule and a complex right smaller nodule (see below).  Thyroid  U/S (10/29/2018): Right inferior 2.1 x 1 x 1.3 cm solid, hypoechoic, thyroid  nodule      12/03/2018: FNA:  Adequacy Reason Satisfactory For Evaluation. Diagnosis THYROID , FINE NEEDLE ASPIRATION, RLP (SPECIMEN 1 OF 1, COLLECTED 12/03/2018): ATYPIA OF UNDETERMINED SIGNIFICANCE (BETHESDA CATEGORY III).   Afirma molecular marker: Benign  12/01/2019: Thyroid  U/S: CLINICAL DATA:  Prior ultrasound follow-up. History of right-sided thyroid  nodule fine-needle aspiration performed 12/03/2018.  Parenchymal Echotexture: Mildly heterogenous Isthmus: Normal in size measures 0.3 cm in diameter Right lobe: Borderline enlarged measuring 6.8 x 1.4 x 2.0 cm, unchanged, previously, 6.7 x 1.9 x 2.0 cm Left lobe: Normal in size measuring 5.6 x 1.4 x 1.7 cm, unchanged previously, 5.0 x 1.4 x 1.7 cm   _________________________________________________________   The previously biopsied nodule within the inferior pole of the right lobe of the thyroid  (labeled 1) is unchanged to slightly increased in size compared to the 10/2018 examination, currently measuring 2.4 x 1.7 x 1.2 cm, previously, 2.1 x 1.3 x 1.0 cm. Correlation with previous biopsy results is advised.  _________________________________________________________   Nodule # 2: Prior biopsy: No Location: Right; Inferior Maximum size: 1.0 cm; Other 2 dimensions: 0.9 x 0.7 cm, previously, 0.9 x 0.8 x 0.7 cm Composition: solid/almost completely solid (2) Echogenicity: hypoechoic (2) Echogenic foci: punctate echogenic foci (3) ACR TI-RADS total points: 7. Change in features: Yes nodule now appears to contain punctate echogenic foci Change in ACR TI-RADS risk category: Yes previously characterized as a TR4 nodule, currently a TR5 nodule ACR TI-RADS recommendations:   **Given size (>/= 1.0 cm) and appearance, fine needle aspiration of this highly suspicious nodule should be considered based on TI-RADS criteria.   _________________________________________________________   There is an approximately 0.6 x 0.5 x 0.5 cm well-defined hypoechoic nodule within the mid aspect the right lobe of the thyroid  which is unchanged in hind site compared to the 11/2018 examination and again does not meet imaging criteria to recommend percutaneous sampling or continued dedicated follow-up.   There is a punctate (approximately 0.2 cm) hypoechoic nodule within the left lobe of the thyroid  which is unchanged in hind site compared to the 11/2018 examination and again does not meet imaging criteria to recommend percutaneous sampling or continued dedicated follow-up.   IMPRESSION: 1. Findings suggestive of multinodular goiter. No definitive new worrisome thyroid  nodules. 2. Nodule #2 now  appears to contain punctate echogenic foci and as such  has been up graded from a TR4 nodule to a TR5 nodule and now meets imaging criteria to recommend percutaneous sampling as clinically indicated. 3. Previously biopsied nodule within the inferior pole of the right lobe of the thyroid  is unchanged to slightly increased in size compared to the 10/2018 examination, currently measuring 2.4 cm, previously, 2.1 cm. Correlation with previous biopsy results is advised. Regardless of slight increase in size, assuming a benign pathologic diagnosis, repeat sampling and/or continued dedicated follow-up is not recommended.  12/02/2019: Thyroid  Uptake and scan: Hot nodule at inferior pole of RIGHT thyroid  lobe with mild suppression of uptake throughout remaining thyroid  tissue. No additional areas of increased or decreased tracer localization.   4 hour I-123 uptake = 14.5% (normal 5-20%)   24 hour I-123 uptake = 22.6% (normal 10-30%)   IMPRESSION: Normal 4 hour and 24 hour radio iodine uptakes.   Hot nodule at inferior pole RIGHT thyroid  lobe with mild suppression of uptake of tracer within remaining thyroid  tissue consistent with hyperfunctional RIGHT inferior thyroid  adenoma.  01/27/2020: FNA of the 1 x 0.9 x 0.7 cm right inferior nodule: FLUS (Bethesda category 3)  Afirma: Suspicious  05/27/2020: Right thyroid  lobectomy with isthmusectomy by Dr. Eletha: FINAL MICROSCOPIC DIAGNOSIS:   A. THYROID  GLAND, RIGHT LOBE AND ISTHMUS, RIGHT HEMITHYROIDECTOMY:  - Papillary carcinoma, multifocal, 0.6 cm in greatest dimension.  - No extrathyroidal extension identified.  - Margins of resection are not involved.  - See oncology table.   Procedure: Right hemithyroidectomy  Tumor Focality: Multifocal  Tumor Site: Right lobe  Tumor Size: Greatest dimension: 0.6 cm  Histologic Type: Papillary carcinoma  Margins: Uninvolved by tumor  Angioinvasion: Not identified  Lymphatic Invasion: Not identified  Extrathyroidal Extension: Not identified  Regional  Lymph Nodes: No lymph nodes submitted or found  Pathologic Stage Classification (pTNM, AJCC 8th Edition): mpT1a, pNX  Representative Tumor Block: A6  Comment: Dr. Alvaro reviewed the case.   Neck U/S (01/10/2022): 1. No significant abnormality of the right thyroidectomy bed. 2. No suspicious left thyroid  nodules.  Neck U/S (01/11/2024): Parenchymal Echotexture: Normal  Isthmus: Surgically absent  Right lobe: Surgically absent  Left lobe: 5.8 x 1.7 x 1.8 cm   Patient is status post right hemithyroidectomy. No right thyroid  bed abnormality, nodule, mass or residual thyroid  tissue. Left thyroid  redemonstrates a 9 mm mixed cystic/solid nodule, unchanged. No follow-up or biopsy recommended. No new thyroid  abnormality. No regional adenopathy.   IMPRESSION: 1. Status post right hemithyroidectomy. 2. Stable 9 mm left thyroid  nodule. 3. No new or acute finding by ultrasound.  Pt denies: - feeling nodules in neck - hoarseness - dysphagia - choking  She is not on levothyroxine.  Reviewed her TFTs: Lab Results  Component Value Date   TSH 0.77 01/10/2024   TSH 1.58 07/18/2023   TSH 1.81 01/03/2023   TSH 1.95 12/28/2021   TSH 1.094 09/05/2021   TSH 1.46 07/06/2021   TSH 2.21 12/23/2020   TSH 3.78 06/17/2020   TSH 0.39 11/11/2019   TSH 0.43 10/11/2018   FREET4 1.2 01/10/2024   FREET4 0.70 07/18/2023   FREET4 0.73 01/03/2023   FREET4 0.80 12/28/2021   FREET4 0.68 07/06/2021   FREET4 0.70 12/23/2020   FREET4 0.70 06/17/2020   FREET4 0.83 11/11/2019    + FH of thyroid  ds in mother: multiple thyroid  nodules - benign, MGGM - also thyroid  nodule. No FH of thyroid  cancer. No h/o radiation tx to head  or neck. No herbal supplements. No Biotin use. No recent steroids use.  Pt also has a history of SVT (with chronic palpitations), heart murmur, HL.   ROS: + see HPI  I reviewed pt's medications, allergies, PMH, social hx, family hx, and changes were documented in the history of  present illness. Otherwise, unchanged from my initial visit note.  Past Medical History:  Diagnosis Date   Anxiety    Chronic diarrhea    Cyst of ovary 12/2017   Depression    Dysrhythmia    PVCs   GERD (gastroesophageal reflux disease)    Headache    stress related   Heart murmur    since childhood   Hyperlipidemia    Papillary thyroid  carcinoma (HCC)    nodules on left lobe still- right side removed   Rapid palpitations 07/08/2015   SVT (supraventricular tachycardia)    last episode- 2 years ago   Vasovagal syncope 2011   no since then   Past Surgical History:  Procedure Laterality Date   THYROID  LOBECTOMY Right 05/27/2020   Procedure: RIGHT THYROID  LOBECTOMY;  Surgeon: Eletha Boas, MD;  Location: [facility];  Service: General;  Laterality: Right;   WISDOM TOOTH EXTRACTION     Social History   Socioeconomic History   Marital status: Single    Spouse name: Not on file   Number of children: 0   Years of education: Not on file   Highest education level: Not on file  Occupational History    Games developer  Social Needs   Financial resource strain: Not on file   Food insecurity:    Worry: Not on file    Inability: Not on file   Transportation needs:    Medical: Not on file    Non-medical: Not on file  Tobacco Use   Smoking status: Current Every Day Smoker    Packs/day: 1.00    Types: Cigarettes   Smokeless tobacco: Never Used   Tobacco comment: 7 cigarettes daily  Substance and Sexual Activity   Alcohol use: Yes    Alcohol/week:     Types:     Comment: Rarely, beer, 3 drinks   Drug use: No  Lifestyle   Physical activity:    Days per week: Not on file    Minutes per session: Not on file   Stress: Not on file  Social History Narrative   Lives in [place] with her mother, Noelani Harbach, and her brother, Penne Breeding.  Identifies as White and American Indian. Endorses tobacco use as well as rare THC use. Denies ETOH use. + sexually active. Has implanon.     GED    Installs pools   Current Outpatient Medications on File Prior to Visit  Medication Sig Dispense Refill   norgestimate -ethinyl estradiol  (SPRINTEC 28) 0.25-35 MG-MCG tablet Take 1 tablet by mouth daily. 84 tablet 3   rosuvastatin  (CRESTOR ) 5 MG tablet Take 1 tablet (5 mg total) by mouth daily. 30 tablet 3   valACYclovir  (VALTREX ) 1000 MG tablet Take 2 tabs by mouth now and again in 12 hrs 4 tablet 3   No current facility-administered medications on file prior to visit.   Allergies  Allergen Reactions   Adhesive [Tape] Itching and Rash    Blistering of skin     Family History  Problem Relation Age of Onset   Arrhythmia Maternal Grandmother    Hypertension Maternal Grandfather    Diabetes Maternal Grandfather    Heart attack Maternal Grandfather  Heart disease Maternal Grandfather    Heart murmur Mother    Melanoma Mother        stage 3 malignant melanoma   Healthy Father    Healthy Brother    Scoliosis Brother    Other Maternal Aunt        TACHYCARDIA   Diabetes Paternal Grandfather    Arrhythmia Other        Aunt w/ tachyarrythmia at age 40   Cancer Other    Stomach cancer Other    Colon cancer Neg Hx    Esophageal cancer Neg Hx    Rectal cancer Neg Hx    PE: BP 120/60   Pulse 89   Ht 5' 4 (1.626 m)   Wt 168 lb 3.2 oz (76.3 kg)   SpO2 98%   BMI 28.87 kg/m  Wt Readings from Last 10 Encounters:  01/09/25 168 lb 3.2 oz (76.3 kg)  11/27/24 166 lb (75.3 kg)  04/18/24 168 lb 12.8 oz (76.6 kg)  01/10/24 162 lb 12.8 oz (73.8 kg)  11/06/23 173 lb (78.5 kg)  07/17/23 174 lb (78.9 kg)  01/03/23 179 lb 3.2 oz (81.3 kg)  09/26/22 175 lb (79.4 kg)  09/08/22 174 lb (78.9 kg)  12/28/21 171 lb 6.4 oz (77.7 kg)   Constitutional: normal weight, in NAD Eyes: EOMI, no exophthalmos ENT: no neck masses palpated, thyroidectomy scar without keloid, inconspicuous, no cervical lymphadenopathy Cardiovascular: RRR, No MRG Respiratory: CTA B Musculoskeletal: no  deformities Skin:  no rashes Neurological: no tremor with outstretched hands  ASSESSMENT: 1.  Papillary thyroid  cancer -Multifocal, subcentimeter  2. S/p right thyroid  lobectomy  3.  History of subclinical hyperthyroidism -Resolved after thyroid  surgery  PLAN: 1.  Papillary thyroid  cancer -Patient with history of thyroid  nodules, of which the dominant nodule was fairly large, hypoechoic, without other worrisome characteristics.  The nodule appeared to be hot on the thyroid  uptake and scan from 2020.  However, on the ultrasound from 11/2019 she also had a small, right inferior thyroid  nodule measuring 1 cm in the largest dimension which appeared to be solid, hypoechoic, and with microcalcifications.  We biopsied this nodule and the results were inconclusive in 01/2020, however, the Afirma molecular marker returned suspicious.  Therefore, I referred her to surgery and she had hemithyroidectomy.  Final pathology showed papillary thyroid  cancer, multifocal, with the largest focus of 0.6 cm but without lymphovascular extension or other worrisome characteristics.  Therefore, she has low risk, stage I, thyroid  cancer.  The cancer was very small so I did not suggest completion thyroidectomy and RAI treatment. - Her ultrasound from 12/2021 did not show any suspicious masses.  We repeated the ultrasound after our last visit and this again did not show any worrisome findings.  She had a left 0.9 cm thyroid  nodule, which was read as stable from previous ultrasound. - She has no neck compression symptoms or masses felt on palpation of her neck today - Thyroglobulin measurements are not indicated in follow-up for patients undergoing only hemithyroidectomy rather than total thyroidectomy, and no RAI treatment - At today's visit, we will recheck her TFTs.   - She does have increased fatigue, however, this may be related to increased stress in nursing school and the lack of exercise.  She is trying to keep up  with diet.  She is not taking beta-blockers as she ran out.  She only has occasional palpitations. - No need to repeat her ultrasound for now, but I plan to check this  again in 1 to 3 years to follow-up on the left thyroid  nodule - I will see her back in 1 year  2.  S/p right lobectomy + isthmusectomy - She previously had keloid at the surgical site, but this healed with Kelo-Cote strips - She has no dysesthesia or pain in the surgical scar - We will check her TFTs today to see if she needs levothyroxine supplementation.  Before last visit, she lost almost 17 pounds, which we discussed likely helped with TSH resistance and reduce the risk of needing levothyroxine.  Since then, she gained few pounds back. -  we discussed that approximately 25% of the patients may require levothyroxine after thyroidectomy but  the need for levothyroxine decreases with time  Orders Placed This Encounter  Procedures   TSH   T4, free   T3, free   Lela Fendt, MD PhD [facility]

## 2025-01-10 LAB — TSH: TSH: 1.25 m[IU]/L

## 2025-01-10 LAB — T3, FREE: T3, Free: 3.8 pg/mL (ref 2.3–4.2)

## 2025-01-10 LAB — T4, FREE: Free T4: 1.3 ng/dL (ref 0.8–1.8)

## 2025-01-12 ENCOUNTER — Ambulatory Visit: Payer: Self-pay | Admitting: Internal Medicine

## 2026-01-11 ENCOUNTER — Ambulatory Visit: Admitting: Internal Medicine
